# Patient Record
Sex: Female | Born: 1935 | Race: White | Hispanic: No | Smoking: Current every day smoker
Health system: Southern US, Community
[De-identification: ages and names within clinical notes are randomized; demographics above are authoritative.]

## PROBLEM LIST (undated history)

## (undated) DIAGNOSIS — M8580 Other specified disorders of bone density and structure, unspecified site: Secondary | ICD-10-CM

## (undated) DIAGNOSIS — M1990 Unspecified osteoarthritis, unspecified site: Secondary | ICD-10-CM

## (undated) DIAGNOSIS — K219 Gastro-esophageal reflux disease without esophagitis: Secondary | ICD-10-CM

## (undated) DIAGNOSIS — F329 Major depressive disorder, single episode, unspecified: Secondary | ICD-10-CM

## (undated) DIAGNOSIS — K649 Unspecified hemorrhoids: Secondary | ICD-10-CM

## (undated) DIAGNOSIS — G629 Polyneuropathy, unspecified: Secondary | ICD-10-CM

## (undated) DIAGNOSIS — Z8489 Family history of other specified conditions: Secondary | ICD-10-CM

## (undated) DIAGNOSIS — T4145XA Adverse effect of unspecified anesthetic, initial encounter: Secondary | ICD-10-CM

## (undated) DIAGNOSIS — J449 Chronic obstructive pulmonary disease, unspecified: Secondary | ICD-10-CM

## (undated) DIAGNOSIS — T8859XA Other complications of anesthesia, initial encounter: Secondary | ICD-10-CM

## (undated) DIAGNOSIS — E039 Hypothyroidism, unspecified: Secondary | ICD-10-CM

## (undated) DIAGNOSIS — Z87442 Personal history of urinary calculi: Secondary | ICD-10-CM

## (undated) DIAGNOSIS — C801 Malignant (primary) neoplasm, unspecified: Secondary | ICD-10-CM

## (undated) DIAGNOSIS — F32A Depression, unspecified: Secondary | ICD-10-CM

## (undated) DIAGNOSIS — F419 Anxiety disorder, unspecified: Secondary | ICD-10-CM

## (undated) DIAGNOSIS — Z923 Personal history of irradiation: Secondary | ICD-10-CM

## (undated) DIAGNOSIS — I10 Essential (primary) hypertension: Secondary | ICD-10-CM

## (undated) DIAGNOSIS — C4490 Unspecified malignant neoplasm of skin, unspecified: Secondary | ICD-10-CM

## (undated) DIAGNOSIS — E785 Hyperlipidemia, unspecified: Secondary | ICD-10-CM

## (undated) DIAGNOSIS — J40 Bronchitis, not specified as acute or chronic: Secondary | ICD-10-CM

## (undated) DIAGNOSIS — C50919 Malignant neoplasm of unspecified site of unspecified female breast: Secondary | ICD-10-CM

## (undated) HISTORY — PX: THYROID SURGERY: SHX805

## (undated) HISTORY — PX: BREAST EXCISIONAL BIOPSY: SUR124

## (undated) HISTORY — PX: ABDOMINAL HYSTERECTOMY: SHX81

## (undated) HISTORY — PX: OTHER SURGICAL HISTORY: SHX169

## (undated) HISTORY — PX: LUNG SURGERY: SHX703

## (undated) HISTORY — PX: BACK SURGERY: SHX140

## (undated) HISTORY — PX: BREAST SURGERY: SHX581

---

## 1997-07-23 DIAGNOSIS — C73 Malignant neoplasm of thyroid gland: Secondary | ICD-10-CM | POA: Insufficient documentation

## 1997-07-23 DIAGNOSIS — C801 Malignant (primary) neoplasm, unspecified: Secondary | ICD-10-CM

## 1997-07-23 HISTORY — DX: Malignant (primary) neoplasm, unspecified: C80.1

## 2004-04-22 ENCOUNTER — Ambulatory Visit: Payer: Self-pay | Admitting: Oncology

## 2004-08-02 ENCOUNTER — Ambulatory Visit: Payer: Self-pay | Admitting: Family Medicine

## 2004-08-16 ENCOUNTER — Ambulatory Visit: Payer: Self-pay | Admitting: Oncology

## 2004-08-23 ENCOUNTER — Ambulatory Visit: Payer: Self-pay | Admitting: Oncology

## 2004-09-20 ENCOUNTER — Ambulatory Visit: Payer: Self-pay | Admitting: Oncology

## 2004-12-14 ENCOUNTER — Ambulatory Visit: Payer: Self-pay | Admitting: Oncology

## 2004-12-21 ENCOUNTER — Ambulatory Visit: Payer: Self-pay | Admitting: Oncology

## 2005-03-07 ENCOUNTER — Ambulatory Visit: Payer: Self-pay | Admitting: Family Medicine

## 2005-03-15 ENCOUNTER — Ambulatory Visit: Payer: Self-pay | Admitting: Oncology

## 2005-03-20 ENCOUNTER — Ambulatory Visit: Payer: Self-pay | Admitting: Family Medicine

## 2005-03-23 ENCOUNTER — Ambulatory Visit: Payer: Self-pay | Admitting: Oncology

## 2005-04-30 ENCOUNTER — Ambulatory Visit: Payer: Self-pay | Admitting: Unknown Physician Specialty

## 2005-06-12 ENCOUNTER — Ambulatory Visit: Payer: Self-pay | Admitting: Oncology

## 2005-06-22 ENCOUNTER — Ambulatory Visit: Payer: Self-pay | Admitting: Oncology

## 2005-07-23 ENCOUNTER — Ambulatory Visit: Payer: Self-pay | Admitting: Oncology

## 2005-09-17 ENCOUNTER — Ambulatory Visit: Payer: Self-pay | Admitting: Family Medicine

## 2005-09-20 ENCOUNTER — Ambulatory Visit: Payer: Self-pay | Admitting: Oncology

## 2005-09-21 ENCOUNTER — Ambulatory Visit: Payer: Self-pay | Admitting: Family Medicine

## 2005-10-21 ENCOUNTER — Ambulatory Visit: Payer: Self-pay | Admitting: Oncology

## 2005-12-19 ENCOUNTER — Ambulatory Visit: Payer: Self-pay | Admitting: Oncology

## 2005-12-21 ENCOUNTER — Ambulatory Visit: Payer: Self-pay | Admitting: Oncology

## 2006-01-18 ENCOUNTER — Ambulatory Visit: Payer: Self-pay

## 2006-01-20 ENCOUNTER — Ambulatory Visit: Payer: Self-pay | Admitting: Oncology

## 2006-02-26 ENCOUNTER — Ambulatory Visit: Payer: Self-pay | Admitting: Oncology

## 2006-03-11 ENCOUNTER — Ambulatory Visit: Payer: Self-pay | Admitting: Family Medicine

## 2006-03-18 ENCOUNTER — Ambulatory Visit: Payer: Self-pay | Admitting: Unknown Physician Specialty

## 2006-03-23 ENCOUNTER — Ambulatory Visit: Payer: Self-pay | Admitting: Oncology

## 2006-04-22 ENCOUNTER — Ambulatory Visit: Payer: Self-pay | Admitting: Unknown Physician Specialty

## 2006-04-23 ENCOUNTER — Ambulatory Visit: Payer: Self-pay | Admitting: Unknown Physician Specialty

## 2006-04-30 ENCOUNTER — Ambulatory Visit: Payer: Self-pay | Admitting: Oncology

## 2006-05-28 ENCOUNTER — Ambulatory Visit: Payer: Self-pay | Admitting: Oncology

## 2006-06-22 ENCOUNTER — Ambulatory Visit: Payer: Self-pay | Admitting: Oncology

## 2006-09-21 ENCOUNTER — Ambulatory Visit: Payer: Self-pay | Admitting: Oncology

## 2006-10-07 ENCOUNTER — Ambulatory Visit: Payer: Self-pay | Admitting: Oncology

## 2006-10-22 ENCOUNTER — Ambulatory Visit: Payer: Self-pay | Admitting: Oncology

## 2006-12-22 ENCOUNTER — Ambulatory Visit: Payer: Self-pay | Admitting: Oncology

## 2007-01-06 ENCOUNTER — Ambulatory Visit: Payer: Self-pay | Admitting: Oncology

## 2007-01-21 ENCOUNTER — Ambulatory Visit: Payer: Self-pay | Admitting: Oncology

## 2007-03-24 ENCOUNTER — Ambulatory Visit: Payer: Self-pay | Admitting: Oncology

## 2007-04-07 ENCOUNTER — Ambulatory Visit: Payer: Self-pay | Admitting: Oncology

## 2007-04-23 ENCOUNTER — Ambulatory Visit: Payer: Self-pay | Admitting: Oncology

## 2007-07-03 ENCOUNTER — Ambulatory Visit: Payer: Self-pay | Admitting: Family Medicine

## 2007-07-24 ENCOUNTER — Ambulatory Visit: Payer: Self-pay | Admitting: Oncology

## 2007-08-24 ENCOUNTER — Ambulatory Visit: Payer: Self-pay | Admitting: Oncology

## 2007-09-21 ENCOUNTER — Ambulatory Visit: Payer: Self-pay | Admitting: Oncology

## 2007-10-15 ENCOUNTER — Ambulatory Visit: Payer: Self-pay | Admitting: Oncology

## 2007-10-22 ENCOUNTER — Ambulatory Visit: Payer: Self-pay | Admitting: Oncology

## 2007-11-21 ENCOUNTER — Ambulatory Visit: Payer: Self-pay | Admitting: Oncology

## 2007-12-22 ENCOUNTER — Ambulatory Visit: Payer: Self-pay | Admitting: Oncology

## 2008-02-17 ENCOUNTER — Ambulatory Visit: Payer: Self-pay | Admitting: Internal Medicine

## 2008-02-21 ENCOUNTER — Ambulatory Visit: Payer: Self-pay | Admitting: Oncology

## 2008-03-23 ENCOUNTER — Ambulatory Visit: Payer: Self-pay | Admitting: Oncology

## 2008-04-14 ENCOUNTER — Ambulatory Visit: Payer: Self-pay | Admitting: Oncology

## 2008-04-22 ENCOUNTER — Ambulatory Visit: Payer: Self-pay | Admitting: Oncology

## 2008-08-30 ENCOUNTER — Ambulatory Visit: Payer: Self-pay | Admitting: General Surgery

## 2008-10-21 ENCOUNTER — Ambulatory Visit: Payer: Self-pay | Admitting: Oncology

## 2008-10-27 ENCOUNTER — Ambulatory Visit: Payer: Self-pay | Admitting: Oncology

## 2008-11-19 ENCOUNTER — Ambulatory Visit: Payer: Self-pay

## 2008-11-20 ENCOUNTER — Ambulatory Visit: Payer: Self-pay | Admitting: Oncology

## 2009-04-22 ENCOUNTER — Ambulatory Visit: Payer: Self-pay | Admitting: Oncology

## 2009-04-25 ENCOUNTER — Ambulatory Visit: Payer: Self-pay | Admitting: General Surgery

## 2009-04-28 ENCOUNTER — Ambulatory Visit: Payer: Self-pay | Admitting: Oncology

## 2009-05-23 ENCOUNTER — Ambulatory Visit: Payer: Self-pay | Admitting: Oncology

## 2009-10-21 ENCOUNTER — Ambulatory Visit: Payer: Self-pay | Admitting: Oncology

## 2009-10-27 ENCOUNTER — Ambulatory Visit: Payer: Self-pay | Admitting: Oncology

## 2009-11-20 ENCOUNTER — Ambulatory Visit: Payer: Self-pay | Admitting: Oncology

## 2010-04-22 ENCOUNTER — Ambulatory Visit: Payer: Self-pay | Admitting: Oncology

## 2010-04-28 ENCOUNTER — Ambulatory Visit: Payer: Self-pay | Admitting: Oncology

## 2010-05-23 ENCOUNTER — Ambulatory Visit: Payer: Self-pay | Admitting: Oncology

## 2010-06-06 ENCOUNTER — Ambulatory Visit: Payer: Self-pay | Admitting: Family Medicine

## 2010-11-13 ENCOUNTER — Ambulatory Visit: Payer: Self-pay | Admitting: Oncology

## 2010-11-14 ENCOUNTER — Ambulatory Visit: Payer: Self-pay

## 2010-11-21 ENCOUNTER — Ambulatory Visit: Payer: Self-pay | Admitting: Oncology

## 2011-06-01 ENCOUNTER — Ambulatory Visit: Payer: Self-pay | Admitting: Oncology

## 2011-06-23 ENCOUNTER — Ambulatory Visit: Payer: Self-pay | Admitting: Oncology

## 2011-07-24 DIAGNOSIS — Z923 Personal history of irradiation: Secondary | ICD-10-CM

## 2011-07-24 HISTORY — DX: Personal history of irradiation: Z92.3

## 2011-07-27 ENCOUNTER — Ambulatory Visit: Payer: Self-pay | Admitting: Oncology

## 2011-08-24 ENCOUNTER — Ambulatory Visit: Payer: Self-pay | Admitting: Oncology

## 2011-09-21 ENCOUNTER — Ambulatory Visit: Payer: Self-pay | Admitting: Oncology

## 2011-10-03 ENCOUNTER — Ambulatory Visit: Payer: Self-pay | Admitting: Family Medicine

## 2011-10-04 ENCOUNTER — Ambulatory Visit: Payer: Self-pay | Admitting: Family Medicine

## 2011-10-08 ENCOUNTER — Ambulatory Visit: Payer: Self-pay | Admitting: Unknown Physician Specialty

## 2011-10-09 LAB — PATHOLOGY REPORT

## 2011-10-22 DIAGNOSIS — C50919 Malignant neoplasm of unspecified site of unspecified female breast: Secondary | ICD-10-CM | POA: Insufficient documentation

## 2011-10-22 HISTORY — PX: BREAST BIOPSY: SHX20

## 2011-10-30 ENCOUNTER — Ambulatory Visit: Payer: Self-pay | Admitting: Surgery

## 2011-11-14 ENCOUNTER — Ambulatory Visit: Payer: Self-pay | Admitting: Surgery

## 2011-11-14 DIAGNOSIS — I10 Essential (primary) hypertension: Secondary | ICD-10-CM

## 2011-11-14 LAB — COMPREHENSIVE METABOLIC PANEL
BUN: 17 mg/dL (ref 7–18)
Bilirubin,Total: 0.6 mg/dL (ref 0.2–1.0)
Calcium, Total: 9.1 mg/dL (ref 8.5–10.1)
Co2: 32 mmol/L (ref 21–32)
EGFR (African American): >60
EGFR (Non-African Amer.): >60
Osmolality: 284 (ref 275–301)
SGPT (ALT): 21 U/L

## 2011-11-14 LAB — CBC WITH DIFFERENTIAL/PLATELET
Basophil #: 0 10*3/uL (ref 0.0–0.1)
Basophil %: 0.5 %
Eosinophil %: 1.3 %
HGB: 12.9 g/dL (ref 12.0–16.0)
Lymphocyte #: 1.5 10*3/uL (ref 1.0–3.6)
Lymphocyte %: 26.3 %
MCV: 84 fL (ref 80–100)
Monocyte #: 0.5 x10 3/mm (ref 0.2–0.9)
WBC: 5.6 10*3/uL (ref 3.6–11.0)

## 2011-11-21 ENCOUNTER — Ambulatory Visit: Payer: Self-pay | Admitting: Surgery

## 2011-11-21 DIAGNOSIS — C50919 Malignant neoplasm of unspecified site of unspecified female breast: Secondary | ICD-10-CM

## 2011-11-21 HISTORY — PX: BREAST LUMPECTOMY: SHX2

## 2011-11-21 HISTORY — DX: Malignant neoplasm of unspecified site of unspecified female breast: C50.919

## 2011-11-26 LAB — PATHOLOGY REPORT

## 2011-11-30 ENCOUNTER — Ambulatory Visit: Payer: Self-pay | Admitting: Oncology

## 2011-11-30 LAB — COMPREHENSIVE METABOLIC PANEL
Alkaline Phosphatase: 122 U/L (ref 50–136)
Anion Gap: 7 (ref 7–16)
BUN: 13 mg/dL (ref 7–18)
Bilirubin,Total: 0.6 mg/dL (ref 0.2–1.0)
Calcium, Total: 9.5 mg/dL (ref 8.5–10.1)
Chloride: 101 mmol/L (ref 98–107)
Co2: 32 mmol/L (ref 21–32)
Creatinine: 0.84 mg/dL (ref 0.60–1.30)
EGFR (African American): >60
EGFR (Non-African Amer.): >60
Osmolality: 280 (ref 275–301)
SGOT(AST): 19 U/L (ref 15–37)

## 2011-11-30 LAB — CBC CANCER CENTER
Basophil #: 0 x10 3/mm (ref 0.0–0.1)
Basophil %: 0.9 %
Eosinophil #: 0.4 x10 3/mm (ref 0.0–0.7)
Eosinophil %: 7.1 %
Lymphocyte #: 1.2 x10 3/mm (ref 1.0–3.6)
MCH: 27.9 pg (ref 26.0–34.0)
MCHC: 33.1 g/dL (ref 32.0–36.0)
MCV: 84 fL (ref 80–100)
Monocyte #: 0.5 x10 3/mm (ref 0.2–0.9)
Monocyte %: 9.6 %
Neutrophil %: 61.3 %
Platelet: 241 x10 3/mm (ref 150–440)
WBC: 5.5 x10 3/mm (ref 3.6–11.0)

## 2011-12-22 ENCOUNTER — Ambulatory Visit: Payer: Self-pay | Admitting: Oncology

## 2012-01-10 LAB — CBC CANCER CENTER
Basophil #: 0 x10 3/mm (ref 0.0–0.1)
Eosinophil #: 0.2 x10 3/mm (ref 0.0–0.7)
Eosinophil %: 4.4 %
HCT: 43.1 % (ref 35.0–47.0)
MCH: 27.7 pg (ref 26.0–34.0)
Monocyte #: 0.5 x10 3/mm (ref 0.2–0.9)
Monocyte %: 9.9 %
Neutrophil #: 2.9 x10 3/mm (ref 1.4–6.5)
Neutrophil %: 62.3 %
Platelet: 222 x10 3/mm (ref 150–440)
WBC: 4.6 x10 3/mm (ref 3.6–11.0)

## 2012-01-14 LAB — CBC CANCER CENTER
Basophil #: 0 x10 3/mm (ref 0.0–0.1)
Basophil %: 0.6 %
Eosinophil #: 0.2 x10 3/mm (ref 0.0–0.7)
Eosinophil %: 5.4 %
HCT: 43.2 % (ref 35.0–47.0)
HGB: 14 g/dL (ref 12.0–16.0)
Lymphocyte %: 28.7 %
MCH: 27.4 pg (ref 26.0–34.0)
MCHC: 32.5 g/dL (ref 32.0–36.0)
MCV: 84 fL (ref 80–100)
Monocyte %: 14.8 %
Neutrophil #: 1.7 x10 3/mm (ref 1.4–6.5)
Neutrophil %: 50.5 %
Platelet: 196 x10 3/mm (ref 150–440)

## 2012-01-14 LAB — COMPREHENSIVE METABOLIC PANEL
Albumin: 3.4 g/dL (ref 3.4–5.0)
Alkaline Phosphatase: 95 U/L (ref 50–136)
Anion Gap: 6 — ABNORMAL LOW (ref 7–16)
BUN: 10 mg/dL (ref 7–18)
Bilirubin,Total: 0.3 mg/dL (ref 0.2–1.0)
Calcium, Total: 9.1 mg/dL (ref 8.5–10.1)
Chloride: 104 mmol/L (ref 98–107)
Co2: 32 mmol/L (ref 21–32)
Creatinine: 0.76 mg/dL (ref 0.60–1.30)
EGFR (African American): >60
EGFR (Non-African Amer.): >60
Glucose: 119 mg/dL — ABNORMAL HIGH (ref 65–99)
Osmolality: 283 (ref 275–301)
Potassium: 3.6 mmol/L (ref 3.5–5.1)
SGOT(AST): 23 U/L (ref 15–37)
SGPT (ALT): 23 U/L
Sodium: 142 mmol/L (ref 136–145)
Total Protein: 6.7 g/dL (ref 6.4–8.2)

## 2012-01-21 ENCOUNTER — Ambulatory Visit: Payer: Self-pay | Admitting: Oncology

## 2012-01-23 LAB — CBC CANCER CENTER
Basophil #: 0 x10 3/mm (ref 0.0–0.1)
Lymphocyte #: 0.7 x10 3/mm — ABNORMAL LOW (ref 1.0–3.6)
Lymphocyte %: 13.6 %
MCHC: 32.5 g/dL (ref 32.0–36.0)
MCV: 84 fL (ref 80–100)
Monocyte %: 12.3 %
Neutrophil #: 3.9 x10 3/mm (ref 1.4–6.5)
Neutrophil %: 71.4 %
Platelet: 200 x10 3/mm (ref 150–440)
RBC: 4.93 10*6/uL (ref 3.80–5.20)
RDW: 14.1 % (ref 11.5–14.5)
WBC: 5.5 x10 3/mm (ref 3.6–11.0)

## 2012-01-31 LAB — CBC CANCER CENTER
Basophil %: 0.8 %
Eosinophil #: 0.1 x10 3/mm (ref 0.0–0.7)
Eosinophil %: 2.6 %
HCT: 40.8 % (ref 35.0–47.0)
HGB: 13.3 g/dL (ref 12.0–16.0)
Lymphocyte #: 0.9 x10 3/mm — ABNORMAL LOW (ref 1.0–3.6)
MCH: 27.7 pg (ref 26.0–34.0)
MCHC: 32.6 g/dL (ref 32.0–36.0)
MCV: 85 fL (ref 80–100)
Monocyte #: 0.5 x10 3/mm (ref 0.2–0.9)
Neutrophil #: 2.6 x10 3/mm (ref 1.4–6.5)
Neutrophil %: 63.9 %
RDW: 13.7 % (ref 11.5–14.5)

## 2012-02-07 LAB — CBC CANCER CENTER
Basophil %: 0.8 %
Eosinophil #: 0.1 x10 3/mm (ref 0.0–0.7)
Eosinophil %: 3.6 %
HCT: 42.4 % (ref 35.0–47.0)
HGB: 13.6 g/dL (ref 12.0–16.0)
Lymphocyte %: 20.4 %
MCH: 27.5 pg (ref 26.0–34.0)
MCHC: 32.2 g/dL (ref 32.0–36.0)
MCV: 85 fL (ref 80–100)
Monocyte #: 0.4 x10 3/mm (ref 0.2–0.9)
Neutrophil #: 2.3 x10 3/mm (ref 1.4–6.5)
Platelet: 166 x10 3/mm (ref 150–440)
WBC: 3.6 x10 3/mm (ref 3.6–11.0)

## 2012-02-14 LAB — CBC CANCER CENTER
Basophil #: 0.1 x10 3/mm (ref 0.0–0.1)
HCT: 42.3 % (ref 35.0–47.0)
Lymphocyte #: 0.9 x10 3/mm — ABNORMAL LOW (ref 1.0–3.6)
MCH: 28.4 pg (ref 26.0–34.0)
MCHC: 33.4 g/dL (ref 32.0–36.0)
MCV: 85 fL (ref 80–100)
Monocyte #: 0.4 x10 3/mm (ref 0.2–0.9)
Monocyte %: 8.5 %
Platelet: 173 x10 3/mm (ref 150–440)
RBC: 4.98 10*6/uL (ref 3.80–5.20)
RDW: 13.9 % (ref 11.5–14.5)
WBC: 5 x10 3/mm (ref 3.6–11.0)

## 2012-02-21 ENCOUNTER — Ambulatory Visit: Payer: Self-pay | Admitting: Oncology

## 2012-03-07 LAB — COMPREHENSIVE METABOLIC PANEL
Bilirubin,Total: 0.5 mg/dL (ref 0.2–1.0)
Calcium, Total: 9.4 mg/dL (ref 8.5–10.1)
Chloride: 104 mmol/L (ref 98–107)
Co2: 35 mmol/L — ABNORMAL HIGH (ref 21–32)
Creatinine: 0.79 mg/dL (ref 0.60–1.30)
EGFR (African American): >60
EGFR (Non-African Amer.): >60
Glucose: 103 mg/dL — ABNORMAL HIGH (ref 65–99)
SGOT(AST): 16 U/L (ref 15–37)
SGPT (ALT): 19 U/L (ref 12–78)
Total Protein: 6.8 g/dL (ref 6.4–8.2)

## 2012-03-07 LAB — CBC CANCER CENTER
Basophil #: 0.1 x10 3/mm (ref 0.0–0.1)
HCT: 42.1 % (ref 35.0–47.0)
HGB: 14.1 g/dL (ref 12.0–16.0)
Lymphocyte %: 12.1 %
MCV: 85 fL (ref 80–100)
Monocyte %: 9.2 %
Neutrophil %: 75.1 %
RBC: 4.95 10*6/uL (ref 3.80–5.20)
RDW: 14.3 % (ref 11.5–14.5)
WBC: 5.4 x10 3/mm (ref 3.6–11.0)

## 2012-03-07 LAB — TSH: Thyroid Stimulating Horm: 0.267 u[IU]/mL — ABNORMAL LOW

## 2012-03-23 ENCOUNTER — Ambulatory Visit: Payer: Self-pay | Admitting: Oncology

## 2012-04-22 ENCOUNTER — Ambulatory Visit: Payer: Self-pay | Admitting: Oncology

## 2012-06-13 ENCOUNTER — Ambulatory Visit: Payer: Self-pay | Admitting: Oncology

## 2012-06-13 LAB — CBC CANCER CENTER
Eosinophil #: 0.1 x10 3/mm (ref 0.0–0.7)
HCT: 40 % (ref 35.0–47.0)
HGB: 13.8 g/dL (ref 12.0–16.0)
MCH: 29.4 pg (ref 26.0–34.0)
MCHC: 34.5 g/dL (ref 32.0–36.0)
MCV: 85 fL (ref 80–100)
Monocyte #: 0.4 x10 3/mm (ref 0.2–0.9)
Monocyte %: 9.8 %
Neutrophil #: 2.6 x10 3/mm (ref 1.4–6.5)
Neutrophil %: 66.9 %
Platelet: 187 x10 3/mm (ref 150–440)
RDW: 13.6 % (ref 11.5–14.5)

## 2012-06-13 LAB — COMPREHENSIVE METABOLIC PANEL
Albumin: 3.5 g/dL (ref 3.4–5.0)
Anion Gap: 3 — ABNORMAL LOW (ref 7–16)
BUN: 14 mg/dL (ref 7–18)
Bilirubin,Total: 0.6 mg/dL (ref 0.2–1.0)
Calcium, Total: 9.4 mg/dL (ref 8.5–10.1)
Chloride: 103 mmol/L (ref 98–107)
EGFR (African American): >60
Glucose: 97 mg/dL (ref 65–99)
Potassium: 3.9 mmol/L (ref 3.5–5.1)
SGOT(AST): 18 U/L (ref 15–37)
SGPT (ALT): 18 U/L (ref 12–78)
Sodium: 141 mmol/L (ref 136–145)
Total Protein: 6.9 g/dL (ref 6.4–8.2)

## 2012-06-14 LAB — CANCER ANTIGEN 27.29: CA 27.29: 27.6 U/mL (ref 0.0–38.6)

## 2012-06-22 ENCOUNTER — Ambulatory Visit: Payer: Self-pay | Admitting: Oncology

## 2012-07-17 ENCOUNTER — Ambulatory Visit: Payer: Self-pay | Admitting: Family Medicine

## 2012-07-17 LAB — RAPID INFLUENZA A&B ANTIGENS

## 2012-07-25 ENCOUNTER — Ambulatory Visit: Payer: Self-pay | Admitting: Radiation Oncology

## 2012-10-06 ENCOUNTER — Ambulatory Visit: Payer: Self-pay | Admitting: Family Medicine

## 2012-10-10 ENCOUNTER — Ambulatory Visit: Payer: Self-pay | Admitting: Oncology

## 2012-10-10 LAB — CBC CANCER CENTER
Basophil #: 0 x10 3/mm (ref 0.0–0.1)
Eosinophil #: 0.3 x10 3/mm (ref 0.0–0.7)
Lymphocyte %: 17.8 %
MCH: 27.7 pg (ref 26.0–34.0)
MCHC: 33 g/dL (ref 32.0–36.0)
MCV: 84 fL (ref 80–100)
Monocyte %: 10.4 %
Neutrophil #: 2.7 x10 3/mm (ref 1.4–6.5)
Neutrophil %: 63.8 %
Platelet: 196 x10 3/mm (ref 150–440)
RBC: 4.94 10*6/uL (ref 3.80–5.20)
WBC: 4.3 x10 3/mm (ref 3.6–11.0)

## 2012-10-10 LAB — COMPREHENSIVE METABOLIC PANEL
Albumin: 3.5 g/dL (ref 3.4–5.0)
BUN: 12 mg/dL (ref 7–18)
Calcium, Total: 9.3 mg/dL (ref 8.5–10.1)
Chloride: 100 mmol/L (ref 98–107)
Creatinine: 0.81 mg/dL (ref 0.60–1.30)
Glucose: 120 mg/dL — ABNORMAL HIGH (ref 65–99)
Potassium: 3.7 mmol/L (ref 3.5–5.1)
SGOT(AST): 17 U/L (ref 15–37)
SGPT (ALT): 18 U/L (ref 12–78)
Sodium: 140 mmol/L (ref 136–145)
Total Protein: 6.7 g/dL (ref 6.4–8.2)

## 2012-10-21 ENCOUNTER — Ambulatory Visit: Payer: Self-pay | Admitting: Oncology

## 2013-05-08 ENCOUNTER — Ambulatory Visit: Payer: Self-pay | Admitting: Oncology

## 2013-05-08 LAB — CBC CANCER CENTER
Basophil #: 0 x10 3/mm (ref 0.0–0.1)
Eosinophil #: 0.1 x10 3/mm (ref 0.0–0.7)
HCT: 43.6 % (ref 35.0–47.0)
HGB: 14.4 g/dL (ref 12.0–16.0)
Lymphocyte #: 0.8 x10 3/mm — ABNORMAL LOW (ref 1.0–3.6)
MCH: 28.2 pg (ref 26.0–34.0)
MCHC: 33 g/dL (ref 32.0–36.0)
Monocyte #: 0.4 x10 3/mm (ref 0.2–0.9)
Monocyte %: 9.5 %
Neutrophil %: 68.2 %
Platelet: 186 x10 3/mm (ref 150–440)
WBC: 4.3 x10 3/mm (ref 3.6–11.0)

## 2013-05-08 LAB — TSH: Thyroid Stimulating Horm: 0.19 u[IU]/mL — ABNORMAL LOW

## 2013-05-08 LAB — COMPREHENSIVE METABOLIC PANEL
Albumin: 3.6 g/dL (ref 3.4–5.0)
Alkaline Phosphatase: 110 U/L (ref 50–136)
Anion Gap: 5 — ABNORMAL LOW (ref 7–16)
BUN: 12 mg/dL (ref 7–18)
Bilirubin,Total: 0.7 mg/dL (ref 0.2–1.0)
Calcium, Total: 9 mg/dL (ref 8.5–10.1)
Co2: 34 mmol/L — ABNORMAL HIGH (ref 21–32)
Creatinine: 0.78 mg/dL (ref 0.60–1.30)
EGFR (Non-African Amer.): >60
SGOT(AST): 16 U/L (ref 15–37)
SGPT (ALT): 21 U/L (ref 12–78)
Total Protein: 6.8 g/dL (ref 6.4–8.2)

## 2013-05-23 ENCOUNTER — Ambulatory Visit: Payer: Self-pay | Admitting: Oncology

## 2013-09-14 ENCOUNTER — Inpatient Hospital Stay: Payer: Self-pay | Admitting: Internal Medicine

## 2013-09-14 LAB — BASIC METABOLIC PANEL
ANION GAP: 4 — AB (ref 7–16)
BUN: 9 mg/dL (ref 7–18)
CALCIUM: 8.5 mg/dL (ref 8.5–10.1)
Chloride: 102 mmol/L (ref 98–107)
Co2: 31 mmol/L (ref 21–32)
Creatinine: 0.94 mg/dL (ref 0.60–1.30)
EGFR (African American): >60
EGFR (Non-African Amer.): 58 — ABNORMAL LOW
GLUCOSE: 148 mg/dL — AB (ref 65–99)
OSMOLALITY: 275 (ref 275–301)
Potassium: 3.4 mmol/L — ABNORMAL LOW (ref 3.5–5.1)
Sodium: 137 mmol/L (ref 136–145)

## 2013-09-14 LAB — CBC
HCT: 41.7 % (ref 35.0–47.0)
HGB: 13.5 g/dL (ref 12.0–16.0)
MCH: 27.9 pg (ref 26.0–34.0)
MCHC: 32.3 g/dL (ref 32.0–36.0)
MCV: 86 fL (ref 80–100)
Platelet: 146 10*3/uL — ABNORMAL LOW (ref 150–440)
RBC: 4.83 10*6/uL (ref 3.80–5.20)
RDW: 13.3 % (ref 11.5–14.5)
WBC: 3 10*3/uL — ABNORMAL LOW (ref 3.6–11.0)

## 2013-09-14 LAB — TROPONIN I

## 2013-09-14 LAB — RAPID INFLUENZA A&B ANTIGENS

## 2013-09-17 LAB — EXPECTORATED SPUTUM ASSESSMENT W REFEX TO RESP CULTURE

## 2013-09-19 LAB — CULTURE, BLOOD (SINGLE)

## 2013-10-07 ENCOUNTER — Ambulatory Visit: Payer: Self-pay | Admitting: Surgery

## 2013-10-28 DIAGNOSIS — M8580 Other specified disorders of bone density and structure, unspecified site: Secondary | ICD-10-CM | POA: Insufficient documentation

## 2013-10-28 DIAGNOSIS — E039 Hypothyroidism, unspecified: Secondary | ICD-10-CM | POA: Insufficient documentation

## 2013-10-28 DIAGNOSIS — M1990 Unspecified osteoarthritis, unspecified site: Secondary | ICD-10-CM | POA: Insufficient documentation

## 2013-10-28 DIAGNOSIS — Z85850 Personal history of malignant neoplasm of thyroid: Secondary | ICD-10-CM | POA: Insufficient documentation

## 2013-10-28 DIAGNOSIS — Z87442 Personal history of urinary calculi: Secondary | ICD-10-CM | POA: Insufficient documentation

## 2013-10-28 DIAGNOSIS — K219 Gastro-esophageal reflux disease without esophagitis: Secondary | ICD-10-CM | POA: Insufficient documentation

## 2013-10-28 DIAGNOSIS — E782 Mixed hyperlipidemia: Secondary | ICD-10-CM | POA: Insufficient documentation

## 2013-10-28 DIAGNOSIS — L858 Other specified epidermal thickening: Secondary | ICD-10-CM | POA: Insufficient documentation

## 2013-10-28 DIAGNOSIS — Z8719 Personal history of other diseases of the digestive system: Secondary | ICD-10-CM | POA: Insufficient documentation

## 2013-10-28 DIAGNOSIS — F329 Major depressive disorder, single episode, unspecified: Secondary | ICD-10-CM | POA: Insufficient documentation

## 2013-10-28 DIAGNOSIS — I10 Essential (primary) hypertension: Secondary | ICD-10-CM | POA: Insufficient documentation

## 2013-11-13 ENCOUNTER — Ambulatory Visit: Payer: Self-pay | Admitting: Oncology

## 2013-11-13 LAB — COMPREHENSIVE METABOLIC PANEL
ALK PHOS: 104 U/L
Albumin: 3.6 g/dL (ref 3.4–5.0)
Anion Gap: 5 — ABNORMAL LOW (ref 7–16)
BUN: 12 mg/dL (ref 7–18)
Bilirubin,Total: 0.5 mg/dL (ref 0.2–1.0)
Calcium, Total: 9.3 mg/dL (ref 8.5–10.1)
Chloride: 101 mmol/L (ref 98–107)
Co2: 35 mmol/L — ABNORMAL HIGH (ref 21–32)
Creatinine: 0.86 mg/dL (ref 0.60–1.30)
EGFR (African American): >60
GLUCOSE: 122 mg/dL — AB (ref 65–99)
Osmolality: 282 (ref 275–301)
POTASSIUM: 3.4 mmol/L — AB (ref 3.5–5.1)
SGOT(AST): 17 U/L (ref 15–37)
SGPT (ALT): 21 U/L (ref 12–78)
Sodium: 141 mmol/L (ref 136–145)
Total Protein: 6.8 g/dL (ref 6.4–8.2)

## 2013-11-13 LAB — CBC CANCER CENTER
BASOS ABS: 0 x10 3/mm (ref 0.0–0.1)
BASOS PCT: 0.9 %
EOS ABS: 0.1 x10 3/mm (ref 0.0–0.7)
Eosinophil %: 2.7 %
HCT: 40 % (ref 35.0–47.0)
HGB: 13.2 g/dL (ref 12.0–16.0)
LYMPHS ABS: 1.1 x10 3/mm (ref 1.0–3.6)
Lymphocyte %: 26.1 %
MCH: 28.4 pg (ref 26.0–34.0)
MCHC: 33 g/dL (ref 32.0–36.0)
MCV: 86 fL (ref 80–100)
Monocyte #: 0.4 x10 3/mm (ref 0.2–0.9)
Monocyte %: 9.5 %
NEUTROS PCT: 60.8 %
Neutrophil #: 2.6 x10 3/mm (ref 1.4–6.5)
PLATELETS: 206 x10 3/mm (ref 150–440)
RBC: 4.65 10*6/uL (ref 3.80–5.20)
RDW: 13.6 % (ref 11.5–14.5)
WBC: 4.3 x10 3/mm (ref 3.6–11.0)

## 2013-11-13 LAB — TSH: Thyroid Stimulating Horm: 0.48 u[IU]/mL

## 2013-11-20 ENCOUNTER — Ambulatory Visit: Payer: Self-pay | Admitting: Oncology

## 2014-04-10 ENCOUNTER — Ambulatory Visit: Payer: Self-pay | Admitting: Emergency Medicine

## 2014-10-12 ENCOUNTER — Ambulatory Visit: Payer: Self-pay | Admitting: Oncology

## 2014-11-03 ENCOUNTER — Ambulatory Visit
Admit: 2014-11-03 | Disposition: A | Discharge status: Home / Self Care | Payer: Self-pay | Attending: Hematology and Oncology | Admitting: Hematology and Oncology

## 2014-11-03 LAB — CBC CANCER CENTER
Basophil #: 0 x10 3/mm (ref 0.0–0.1)
Basophil %: 0.9 %
Eosinophil #: 0.2 x10 3/mm (ref 0.0–0.7)
Eosinophil %: 3.7 %
HCT: 38.4 % (ref 35.0–47.0)
HGB: 12.6 g/dL (ref 12.0–16.0)
Lymphocyte #: 1 x10 3/mm (ref 1.0–3.6)
Lymphocyte %: 23.2 %
MCH: 27.5 pg (ref 26.0–34.0)
MCHC: 32.8 g/dL (ref 32.0–36.0)
MCV: 84 fL (ref 80–100)
Monocyte #: 0.4 x10 3/mm (ref 0.2–0.9)
Monocyte %: 8.8 %
Neutrophil #: 2.7 x10 3/mm (ref 1.4–6.5)
Neutrophil %: 63.4 %
Platelet: 206 x10 3/mm (ref 150–440)
RBC: 4.58 10*6/uL (ref 3.80–5.20)
RDW: 13.5 % (ref 11.5–14.5)
WBC: 4.2 x10 3/mm (ref 3.6–11.0)

## 2014-11-03 LAB — COMPREHENSIVE METABOLIC PANEL
Albumin: 3.9 g/dL
Alkaline Phosphatase: 92 U/L
Anion Gap: 7 (ref 7–16)
BUN: 12 mg/dL
Bilirubin,Total: 0.5 mg/dL
Calcium, Total: 9.2 mg/dL
Chloride: 98 mmol/L — ABNORMAL LOW
Co2: 34 mmol/L — ABNORMAL HIGH
Creatinine: 0.74 mg/dL
EGFR (African American): >60
EGFR (Non-African Amer.): >60
Glucose: 121 mg/dL — ABNORMAL HIGH
Potassium: 3.7 mmol/L
SGOT(AST): 21 U/L
SGPT (ALT): 15 U/L
Sodium: 139 mmol/L
Total Protein: 6.4 g/dL — ABNORMAL LOW

## 2014-11-03 LAB — TSH: Thyroid Stimulating Horm: 0.739 u[IU]/mL

## 2014-11-05 ENCOUNTER — Other Ambulatory Visit: Payer: Self-pay | Admitting: Hematology and Oncology

## 2014-11-05 DIAGNOSIS — Z1231 Encounter for screening mammogram for malignant neoplasm of breast: Secondary | ICD-10-CM

## 2014-11-11 ENCOUNTER — Ambulatory Visit
Admit: 2014-11-11 | Disposition: A | Discharge status: Home / Self Care | Payer: Self-pay | Attending: Urology | Admitting: Urology

## 2014-11-13 NOTE — H&P (Signed)
PATIENT NAME:  Rose Irwin, Rose Irwin MR#:  [id number] DATE OF BIRTH:  09-21-1931  DATE OF ADMISSION:  09/14/2013  PRIMARY CARE PHYSICIAN:  Dr. Juluis Pitch.  CHIEF COMPLAINT: Shortness of breath and chest tightness for 2 to 3 days.   HISTORY OF PRESENT ILLNESS: Rose Irwin is a 81 year old Caucasian female with history of COPD and ongoing tobacco abuse for many years, history of hypothyroidism, depression, breast cancer, comes to the Emergency Room after she started having increasing shortness of breath for 3 days. The patient's husband was sick  last week with URI symptoms. She started taking his leftover antibiotics, however, continued to feel poorly with increasing shortness of breath. She was found to be very tachycardic with heart rate in the 110s and was hypoxic with sats in the 80s on arrival to the Emergency Room. Chest x-ray does not show any evidence of pneumonia. She is being admitted with acute hypoxic respiratory failure secondary to COPD exacerbation with possible bronchitis.   PAST MEDICAL HISTORY:   1.  History of breast cancer.  2.  COPD disease with ongoing tobacco abuse.  3.  Depression.  4.  Hypothyroidism.  5.  Anxiety.  6.  History of hypertension.   PAST SURGICAL HISTORY:  1.  Thyroidectomy.  2.  Partial mastectomy.  3.  Hysterectomy.  4.  Lumbar laminectomy.   ALLERGIES: ASPIRIN, CODEINE AND SULFA.   MEDICATIONS: 1.  Zocor 20 mg p.o. daily.  2.  Xanax 0.5 mg b.i.d.  3.  Synthroid 100 mcg p.o. daily.  4.  Paxil 40 mg p.o. daily.  5.  Metoprolol 50 mg b.i.d.  6.  Lasix 40 mg daily.  7.  Combivent 2 puffs 3 times a day.  8.  Aspirin 81 mg daily.  9.  Anastrozole 1 mg p.o. daily.   FAMILY HISTORY:  Positive for breast cancer in both mother and sister, along with history of colon cancer and leukemia. History of heart disease and hypertension.   SOCIAL HISTORY: Does smoke about 5 to 6 cigarettes a day. Lives with her husband.   REVIEW OF SYSTEMS:  CONSTITUTIONAL:  No fever. Positive for fatigue and weakness.  EYES: No blurred or double vision or glaucoma.  EARS, NOSE, THROAT:  No tinnitus, ear pain, hearing loss.  RESPIRATORY:  Positive for shortness of breath, cough. No hemoptysis.    CARDIOVASCULAR: Positive for chest tightness. No arrhythmia. Positive for hypertension.  GASTROINTESTINAL: No nausea, vomiting, diarrhea, abdominal pain, melena or GERD. GENITOURINARY:   No dysuria or hematuria.  ENDOCRINE: No polyuria, nocturia or thyroid problems.  HEMATOLOGY: No anemia, easy bruising or bleeding disorder.  MUSCULOSKELETAL: Positive for back pain, chronic. No gout, swelling of joints.  NEUROLOGIC: No CVA, TIA, tremors, seizures or syncope.  PSYCHIATRIC: The patient appears somewhat anxious. No bipolar or schizophrenia. All other systems reviewed and negative.   PHYSICAL EXAMINATION: GENERAL: The patient is awake, alert, oriented x 3.  VITAL SIGNS: Temperature is 98.7, pulse is 110, regular. His respirations 18, blood pressure 167/65. Sats are 95% on 2 liters.  HEENT: Atraumatic, normocephalic. Pupils: PERRLA. EOM intact. Oral mucosa is moist.  NECK: Supple. No JVD. No carotid bruit.  RESPIRATORY: The patient does have distant breath sounds. She is moving air, bilateral good air entry. No crackles, rales or rhonchi heard at this time. No use of accessory muscles or labored breathing.  CARDIOVASCULAR: Both the heart sounds are normal. Rate is tachycardic. Rhythm is regular. No murmur heard. PMI not lateralized. Chest not tender.  EXTREMITIES:  Good pedal pulses, good femoral pulses. No lower extremity edema.  ABDOMEN: Soft, benign, nontender. No organomegaly. Positive bowel sounds.  NEUROLOGIC: Grossly intact cranial nerves II through XII. No motor or sensory deficit.  PSYCHIATRIC: The patient is awake, alert, oriented x 3.   CBC within normal limits. Basic metabolic panel within normal limits except potassium of 3.4 and glucose of 148. Cardiac enzymes  first set is negative. Chest x-ray is stable cardiomegaly with COPD, mild interstitial prominence and could reflect bronchitis.   ASSESSMENT AND PLAN: A 81 year old Rose Irwin with history of chronic obstructive pulmonary disease, ongoing tobacco abuse, hypothyroidism and hypertension, comes in with increasing shortness of breath and cough. She had a sick contact with her husband, who was down with upper respiratory infection symptoms last week. She is being admitted with:  1. Acute hypoxic respiratory failure secondary to chronic obstructive pulmonary disease exacerbation along with acute bronchitis. We will admit the patient to the medical floor, start her on IV Solu-Medrol around-the-clock, empiric antibiotic with Zithromax, continue nebulizer treatments around-the-clock, continue her Combivent, add Advair 1 puff b.i.d. Follow blood cultures and sputum cultures. Continue oxygen for now and wean as tolerated.  2.  Ongoing tobacco abuse. Smoking cessation was discussed with the patient; about 3 minutes spent on smoking cessation counseling. The patient tells me she is going to be working towards it.  3.  Hypertension, continue on metoprolol.  4.  History of breast cancer. Continue anastrozole.  5.  History of hypothyroidism, on Synthroid.  6.  Anxiety, depression. Continue Xanax and Paxil.  7.  Deep vein thrombosis prophylaxis, subQ heparin t.i.d.   Further workup per the patient's clinical course. Hospital admission plan was discussed with the patient. No family members were present. The patient is a FULL CODE.   TIME SPENT: 50 minutes.    ____________________________ Hart Rochester Posey Pronto, MD sap:dmm D: 09/14/2013 09:11:19 ET T: 09/14/2013 09:37:55 ET JOB#: [id number]  cc: Copeland Neisen A. Posey Pronto, MD, <Dictator> Youlanda Roys. Lovie Macadamia, MD Ilda Basset MD ELECTRONICALLY SIGNED 09/20/2013 13:41

## 2014-11-13 NOTE — Discharge Summary (Signed)
PATIENT NAME:  Rose Irwin, BLIZZARD MR#:  [id number] DATE OF BIRTH:  1931-09-06  DATE OF ADMISSION:  09/14/2013 DATE OF DISCHARGE:  09/16/2013  PRESENTING COMPLAINT: Shortness of breath and cough.   DISCHARGE DIAGNOSES:  1.  Acute-on-chronic hypoxic respiratory failure secondary to chronic obstructive pulmonary disease flare.  2.  Ex-smoker.  3.  Saturations 87% on room air, 93% to 95% on 3 liters.   CODE STATUS: FULL CODE.   MEDICATIONS:  1.  Anastrozole 1 mg p.o. daily.  2.  Xanax 0.5 mg b.i.d. as needed.  3.  Synthroid 100 mcg p.o. daily.  4.  Paxil 40 mg p.o. daily.  5.  Aspirin 81 mg daily.  6.  Lasix 40 mg daily as needed.  7.  Combivent 2 puffs three times a day as needed.  8.  Metoprolol 50 mg b.i.d.  9.  Zocor 20 mg p.o. daily at bedtime.  10.  Prednisone taper.  11.  Azithromycin 250 mg p.o. daily.  12.  Advair 250/50 one puff b.i.d.   BRIEF SUMMARY OF HOSPITAL COURSE: [name] is a very pleasant 81 year old Caucasian female with long-standing history of smoking and history of COPD along with hypertension who comes in with increasing shortness of breath and cough. She had a sick contact with her husband who is down with upper respiratory infection. She is being admitted with:  1.  Acute hypoxic respiratory failure secondary to COPD exacerbation along with acute bronchitis. She was admitted on medical floor, started on IV Solu-Medrol, empiric antibiotics with Zithromax, continued nebulizer treatments around the clock along with Combivent and Advair. Her prednisone was changed to p.o. taper. Sats dropped down to 87% on room air, improved with 3 liters nasal cannula oxygen to 94% and she has been set up with home oxygen.  2.  Ongoing tobacco abuse. The patient was advised on smoking cessation.  3.  Hypertension, on metoprolol.  4.  History of breast cancer. Continue anastrozole.  5.  Hypothyroidism, Synthroid.  6.  Anxiety, depression, on Xanax and Paxil.  7.  DVT  prophylaxis. Subcu heparin was provided.   Hospital stay otherwise remained stable.   CODE STATUS: THE PATIENT REMAINED A FULL CODE.   The patient is being sent home with oxygen and she will follow up with Dr. Lovie Macadamia in 1 to 2 weeks.   TIME SPENT: 40 minutes.  ____________________________ Hart Rochester Posey Pronto, MD sap:np D: 09/16/2013 14:35:11 ET T: 09/16/2013 17:00:56 ET JOB#: [id number]  cc: Delynn Pursley A. Posey Pronto, MD, <Dictator> Ilda Basset MD ELECTRONICALLY SIGNED 09/29/2013 15:21

## 2014-11-14 NOTE — Consult Note (Signed)
Reason for Visit: This 80 year old Female patient presents to the clinic for initial evaluation of .   Referred by Dr. Smith.  Diagnosis:   Chief Complaint/Diagnosis   72-year-old female with pathologic stage I (T1 B. N0 M0) invasive mammary carcinoma ER/PR positive HER-2/neu negative status post wide local excision and sentinel node biopsy   Pathology Report Pathology report reviewed    Imaging Report Mammograms ultrasound reviewed    Referral Report Clinical notes reviewed    Planned Treatment Regimen Adjuvant right breast radiation    HPI   patient is a 80-year-old female in excellent general health who presented with an abnormal mammogram of the right breast. She has a history of thyroid carcinoma status post resection and adjuvant I-131 treatment back in August of 99. She was seen by Dr. Smith who performed a needle biopsy which was positive for invasive mammary carcinoma. Underwent wide local excision and sentinel node biopsy. Tumor was0.9 cm with margins clear. Tumor was ER/PR positive HER-2/neu negative. Sentinel lymph node was negative. She has had significant pain in her breast after her lumpectomy and still having significant pain and discomfort. She is on chronic aspirin therapy. Patient also does take Plavix. She is otherwise doing well. She has been seen by medical oncology and will be on Aremadex after completion of radiation.  Past Hx:    partial mastectomy:    hysterectomy: 1969   lumbar laminectomy: in 1999 2007   thyroid biopsy with radiation: 2007   lung biopsy: 1981  Past, Family and Social History:   Past Medical History positive    Endocrine Thyroid resection with adjuvant I 31 treatment    Past Surgical History Hysterectomy, lumbar laminectomy, lung biopsy, thyroid resection    Past Medical History Comments Migraine headaches, arthritis, recurrent UTIs    Family History positive    Family History Comments Family history positive for breast cancer  both mother and sister, also strong family history rectal cancer and leukemia    Social History noncontributory    Additional Past Medical and Surgical History Seen by yourself today   Allergies:   Sulfa: Other  Codeine: Other  Aspirin: Unknown  Home Meds:  Home Medications: Medication Instructions Status  Xanax 0.5 mg oral tablet 1 tab(s) orally once a day (at bedtime) x 30 days, As Needed Active  Calcium 600+D 600 mg-200 units tablet 1 tab(s) orally 2 times a day x 30 days Active  anastrozole 1 mg tablet 1 tab(s) orally once a day x 30 days Active  Synthroid 100 mcg (0.1 mg) oral tablet 1 tab(s) orally once a day in am Active  paxil 20mg 1 tab(s)  once a day in am Active  aspirin 81mg 1 tab(s)  once a day in am Active  combivent inhaler  2 puff(s)  3 times a day, As Needed Active  lasix 40mg 1 tab(s)  once a day as needed   Active  toprol 100mg 0.5 tab(s) orally 2 times a day Active  zocor 20mg 1 tab(s) orally once a day (at bedtime) Active  omeprazole 20 mg oral delayed release capsule 1 cap(s) orally once a day Active   Review of Systems:   General negative    Performance Status (ECOG) 0    Skin negative    Breast see HPI    ENMT negative    Respiratory and Thorax negative    Cardiovascular negative    Gastrointestinal negative    Genitourinary negative    Musculoskeletal negative      Neurological negative    Psychiatric negative    Hematology/Lymphatics negative    Endocrine see HPI    Allergic/Immunologic negative   Nursing Notes:  Nursing Vital Signs and Chemo Nursing Nursing Notes: *CC Vital Signs Flowsheet:   03-Jun-13 13:41   Temp Temperature 96   Pulse Pulse 64   Respirations Respirations 18   SBP SBP 154   DBP DBP 650   Current Weight (kg) (kg) 67.8   Height (cm) centimeters 165.1   BSA (m2) 1.7   Physical Exam:  General/Skin/HEENT:   General normal    Skin normal    Eyes normal    ENMT normal    Head and Neck normal     Additional PE Well-developed elderly female in NAD. Breasts are symmetric. Right breast is still tender to the touch. She seems to have developed calcified blood in her lumpectomy site. No other dominant mass or nodularity is noted in either breast into position examined. Lungs are clear to A&P cardiac examination shows regular rate and rhythm.   Breasts/Resp/CV/GI/GU:   Respiratory and Thorax normal    Cardiovascular normal    Gastrointestinal normal    Genitourinary normal   MS/Neuro/Psych/Lymph:   Musculoskeletal normal    Neurological normal    Lymphatics normal   Assessment and Plan:  Impression:   pathologic stage I invasive mammary carcinoma the right breast status post wide local excision and sentinel node biopsy in 80-year-old female ER/PR positive HER-2/neu negative not to receive adjuvant chemotherapy.  Plan:   I discussed treatment options with the patient including accelerated partial breast and radiation with MammoSite catheter placed as well as whole breast radiation. She is reluctant to undergo any further surgery based on the significant pain and discomfort she is experienced. I will go ahead and planned 5000 cGy of external beam treatment to her right breast. We will also boost or scar another 1400 cGy. Her margins initially were close at 1 mm although on slight reexcision margins were negative. Patient will also start Aremadex after completion of radiation and I told her to hold off on that until radiation is complete. I have scheduled her for CT simulation later this week.  I would like to take this opportunity to thank you for allowing me to continue to participate in this patient's care.  CC Referral:   cc: Dr. Smith, Dr. David Bronstein   Electronic Signatures: Chrystal, Glenn S (MD)  (Signed 03-Jun-13 15:53)  Authored: HPI, Diagnosis, Past Hx, PFSH, Allergies, Home Meds, ROS, Nursing Notes, Physical Exam, Encounter Assessment and Plan, CC Referring  Physician   Last Updated: 03-Jun-13 15:53 by Chrystal, Glenn S (MD) 

## 2014-11-14 NOTE — Op Note (Signed)
PATIENT NAME:  Rose Irwin, Rose Irwin MR#:  [id number] DATE OF BIRTH:  1932-02-04  DATE OF PROCEDURE:  11/21/2011  PREOPERATIVE DIAGNOSIS: Right breast cancer.   POSTOPERATIVE DIAGNOSIS: Right breast cancer.   PROCEDURE: Right partial mastectomy with axillary sentinel lymph node biopsy.   SURGEON: Rochel Brome, M.D.   ANESTHESIA: General.   INDICATIONS: This 81 year old female recently had an abnormal mammogram with findings of a density in the lateral aspect of the right breast. She had ultrasound-guided core biopsy which demonstrated an infiltrating mammary carcinoma. The nodule was some 8.7 cm in dimension. She had preoperative ultrasound-guided insertion of a Kopans wire with follow-up mammogram. She had preoperative injection of radioactive technetium sulfur colloid.  DESCRIPTION OF PROCEDURE: The patient was placed on the operating table in the supine position under general anesthesia. The right arm was extended on a lateral arm rest. The dressing was removed from the lateral aspect of the right breast exposing the Kopans wire which was cut 3 cm from the skin. The wire entered the peripheral aspect of the breast at approximately the 9 o'clock position. The breast was prepared with ChloraPrep and draped in a sterile manner.   Next, the gamma counter was used to probe the inferior aspect of the axilla demonstrating location of radioactivity. An oblique incision was made some 4 cm in length, in the inferior aspect of the axilla, and carried down through subcutaneous tissues through superficial fascia. One vein was divided between 4-0 chromic ligatures and an artery was divided between 4-0 chromic ligatures. Dissection was carried down into the axillary fat pad using the gamma counter for direction and demonstrated the location of a lymph node adjacent to the rib cage deep within the inferior aspect of the axilla. This lymph node was approximately 5 to 6 mm in dimension and was dissected free from  surrounding structures including some fatty tissue with it. The ex vivo count per second was in the range of 150 to 170. The lymph node was submitted for pathology. The background count was less than 5. There was no palpable mass within the axilla. A moist sponge was placed into the wound.   Attention was turned to do the right partial mastectomy. The Kopans wire was again noted. The mammograms were viewed. Ultrasound was used to demonstrate location of the density, at the 9 o'clock  position of the right breast. Next, a curvilinear incision was made from approximately the 8 o'clock  to 10 o'clock  position to remove an ellipse of skin approximately 12 mm wide. This was carried down through subcutaneous tissues and further identified the mass numerous times with ultrasound and dissected around the mass, around the wire, down deep into the breast, down adjacent to the deep fascia. The specimen was tagged at the 2 o'clock position of the skin ellipse with a 3-0 nylon stitch and also margin markers were sutured to the wound for the pathologist's orientation, and it was submitted for pathology.   The pathologist did call back to indicate that the sentinel lymph node appeared to be free of micrometastasis. The axillary wound was inspected. Hemostasis was intact. The wound was closed with a running 5-0 Monocryl subcuticular suture.   Next, the partial mastectomy wound was further inspected. Numerous small bleeding points were cauterized. Hemostasis was subsequently intact. Subcutaneous tissues for both wounds were infiltrated with 0.5% Sensorcaine with epinephrine and also in the partial mastectomy wound some of the deeper tissues were infiltrated as well. Next, the subcutaneous tissues were approximated with  interrupted 4-0 chromic. The skin was closed with running 5-0 Monocryl subcuticular suture. During the course of the closure, the pathologist called back to say that all margins were clear, except deep margin  was close. However, it did not appear that there was additional breast tissue remaining in the deep margin and did not identify any other palpable mass.   The wound was then completely closed with 5-0 Monocryl subcuticular suture. Both wounds were treated with Dermabond. The patient tolerated surgery satisfactorily and was then prepared for transfer to the recovery room. ____________________________ Lenna Sciara. Rochel Brome, MD jws:slb D: 11/21/2011 13:31:24 ET     T: 11/21/2011 14:38:12 ET        JOB#: [id number] cc: Loreli Dollar, MD, <Dictator> Loreli Dollar MD ELECTRONICALLY SIGNED 11/21/2011 23:24

## 2014-11-30 ENCOUNTER — Ambulatory Visit
Admission: RE | Admit: 2014-11-30 | Discharge: 2014-11-30 | Disposition: A | Discharge status: Home / Self Care | Payer: Medicare Other | Source: Ambulatory Visit | Attending: Hematology and Oncology | Admitting: Hematology and Oncology

## 2014-11-30 DIAGNOSIS — Z1231 Encounter for screening mammogram for malignant neoplasm of breast: Secondary | ICD-10-CM | POA: Diagnosis not present

## 2014-11-30 DIAGNOSIS — M810 Age-related osteoporosis without current pathological fracture: Secondary | ICD-10-CM | POA: Insufficient documentation

## 2014-12-02 NOTE — Progress Notes (Signed)
Informed pt of bone scan results of osteoporosis and treatment had not been discussed; made Dr. Mike Gip aware

## 2014-12-03 ENCOUNTER — Telehealth: Payer: Self-pay

## 2014-12-03 NOTE — Telephone Encounter (Signed)
-----   Message from Lequita Asal, MD sent at 12/03/2014  4:43 AM EDT ----- Please let the patient know that the Arimidex can make her osteoporosis worse.  Osteoporosis puts her at risk for fractures.  We can get her preauthorized for Prolia (injection every 6 months) or Bisphosphonate pills (Fosamax, Boniva or Actonel).  Pills can be problematic if she has reflux.  Need to make sure she has had a recent dental  exam secondary to small risk of osteonecrosis.  If she wants to discuss further, we can make her an appointment.  M

## 2014-12-10 ENCOUNTER — Telehealth: Payer: Self-pay | Admitting: Hematology and Oncology

## 2014-12-10 NOTE — Telephone Encounter (Signed)
She wants to answer Dr. Kem Parkinson questions about whether or not she wants to start on injections. Please call.

## 2014-12-13 NOTE — Telephone Encounter (Signed)
Dr. Mike Gip, Please contact pt regarding injections.

## 2014-12-22 NOTE — Telephone Encounter (Signed)
Patient needs to know what the injections are for so she can decide if she will take them or not. Financial issues are a consideration so she wants to know if Dr. Mike Gip thinks this is absolutely necessary. Please advise.

## 2014-12-22 NOTE — Telephone Encounter (Signed)
  Spoke with the patient.  She is considering Prolia for osteoporosis. Please preauth Prolia.  She needs to have a dental exam before she starts.   She states her "teeth are crumbing". I asked her to ask her dentist to contact me.  M

## 2014-12-31 ENCOUNTER — Telehealth: Payer: Self-pay | Admitting: *Deleted

## 2014-12-31 MED ORDER — ALPRAZOLAM 0.5 MG PO TABS: 0.5000 mg | ORAL_TABLET | Freq: Two times a day (BID) | ORAL | Status: DC | PRN

## 2014-12-31 NOTE — Telephone Encounter (Signed)
Called in.

## 2015-01-03 DIAGNOSIS — J449 Chronic obstructive pulmonary disease, unspecified: Secondary | ICD-10-CM | POA: Insufficient documentation

## 2015-01-17 DIAGNOSIS — R0602 Shortness of breath: Secondary | ICD-10-CM | POA: Insufficient documentation

## 2015-01-18 ENCOUNTER — Inpatient Hospital Stay: Admission: RE | Admit: 2015-01-18 | Payer: Medicare Other | Source: Ambulatory Visit

## 2015-01-18 DIAGNOSIS — R002 Palpitations: Secondary | ICD-10-CM | POA: Insufficient documentation

## 2015-01-20 ENCOUNTER — Encounter
Admission: RE | Admit: 2015-01-20 | Discharge: 2015-01-20 | Disposition: A | Discharge status: Home / Self Care | Payer: Medicare Other | Source: Ambulatory Visit | Attending: Urology | Admitting: Urology

## 2015-01-20 DIAGNOSIS — Z01812 Encounter for preprocedural laboratory examination: Secondary | ICD-10-CM | POA: Diagnosis present

## 2015-01-20 DIAGNOSIS — R310 Gross hematuria: Secondary | ICD-10-CM | POA: Insufficient documentation

## 2015-01-20 HISTORY — DX: Malignant (primary) neoplasm, unspecified: C80.1

## 2015-01-20 HISTORY — DX: Anxiety disorder, unspecified: F41.9

## 2015-01-20 HISTORY — DX: Chronic obstructive pulmonary disease, unspecified: J44.9

## 2015-01-20 HISTORY — DX: Depression, unspecified: F32.A

## 2015-01-20 HISTORY — DX: Other specified disorders of bone density and structure, unspecified site: M85.80

## 2015-01-20 HISTORY — DX: Essential (primary) hypertension: I10

## 2015-01-20 HISTORY — DX: Hypothyroidism, unspecified: E03.9

## 2015-01-20 HISTORY — DX: Polyneuropathy, unspecified: G62.9

## 2015-01-20 HISTORY — DX: Bronchitis, not specified as acute or chronic: J40

## 2015-01-20 HISTORY — DX: Major depressive disorder, single episode, unspecified: F32.9

## 2015-01-20 HISTORY — DX: Gastro-esophageal reflux disease without esophagitis: K21.9

## 2015-01-20 HISTORY — DX: Hyperlipidemia, unspecified: E78.5

## 2015-01-20 HISTORY — DX: Unspecified osteoarthritis, unspecified site: M19.90

## 2015-01-20 HISTORY — DX: Adverse effect of unspecified anesthetic, initial encounter: T41.45XA

## 2015-01-20 HISTORY — DX: Unspecified hemorrhoids: K64.9

## 2015-01-20 HISTORY — DX: Other complications of anesthesia, initial encounter: T88.59XA

## 2015-01-20 HISTORY — DX: Unspecified malignant neoplasm of skin, unspecified: C44.90

## 2015-01-20 LAB — CBC
HCT: 40.4 % (ref 35.0–47.0)
Hemoglobin: 13.2 g/dL (ref 12.0–16.0)
MCH: 27.7 pg (ref 26.0–34.0)
MCHC: 32.8 g/dL (ref 32.0–36.0)
MCV: 84.5 fL (ref 80.0–100.0)
Platelets: 212 10*3/uL (ref 150–440)
RBC: 4.78 MIL/uL (ref 3.80–5.20)
RDW: 13.5 % (ref 11.5–14.5)
WBC: 5.4 10*3/uL (ref 3.6–11.0)

## 2015-01-20 LAB — BASIC METABOLIC PANEL
Anion gap: 7 (ref 5–15)
BUN: 12 mg/dL (ref 6–20)
CALCIUM: 9.3 mg/dL (ref 8.9–10.3)
CO2: 31 mmol/L (ref 22–32)
Chloride: 102 mmol/L (ref 101–111)
Creatinine, Ser: 0.66 mg/dL (ref 0.44–1.00)
GFR calc Af Amer: >60 mL/min (ref >60)
GFR calc non Af Amer: >60 mL/min (ref >60)
GLUCOSE: 113 mg/dL — AB (ref 65–99)
Potassium: 4.1 mmol/L (ref 3.5–5.1)
SODIUM: 140 mmol/L (ref 135–145)

## 2015-01-20 NOTE — Patient Instructions (Signed)
  Your procedure is scheduled on: 02/02/15 Wed  Report to Day Surgery. To find out your arrival time please call [phone] between 1PM - 3PM on 02/01/15 Tues.  Remember: Instructions that are not followed completely may result in serious medical risk, up to and including death, or upon the discretion of your surgeon and anesthesiologist your surgery may need to be rescheduled.    _x___ 1. Do not eat food or drink liquids after midnight. No gum chewing or hard candies.     ____ 2. No Alcohol for 24 hours before or after surgery.   ____ 3. Bring all medications with you on the day of surgery if instructed.    _x__ 4. Notify your doctor if there is any change in your medical condition     (cold, fever, infections).     Do not wear jewelry, make-up, hairpins, clips or nail polish.  Do not wear lotions, powders, or perfumes. You may wear deodorant.  Do not shave 48 hours prior to surgery. Men may shave face and neck.  Do not bring valuables to the hospital.    [facility] is not responsible for any belongings or valuables.               Contacts, dentures or bridgework may not be worn into surgery.  Leave your suitcase in the car. After surgery it may be brought to your room.  For patients admitted to the hospital, discharge time is determined by your                treatment team.   Patients discharged the day of surgery will not be allowed to drive home.   Please read over the following fact sheets that you were given:      ____ Take these medicines the morning of surgery with A SIP OF WATER:    1. levothyroxine (SYNTHROID, LEVOTHROID) 100 MCG tablet  2. albuterol-ipratropium (COMBIVENT) 18-103 MCG/ACT inhaler  3. Fluticasone-Salmeterol (ADVAIR) 250-50 MCG/DOSE AEPB  4.losartan (COZAAR) 25 MG tablet  5.metoprolol succinate (TOPROL-XL) 50 MG 24 hr tablet  6.PARoxetine (PAXIL) 40 MG tablet  ____ Fleet Enema (as directed)   ____ Use CHG Soap as directed  _x___ Use inhalers on  the day of surgery  ____ Stop metformin 2 days prior to surgery    ____ Take 1/2 of usual insulin dose the night before surgery and none on the morning of surgery.   ____ Stop Coumadin/Plavix/aspirin on Stopped aspirin on 01/17/15  ____ Stop Anti-inflammatories on   ____ Stop supplements until after surgery.    ____ Bring C-Pap to the hospital.

## 2015-01-31 ENCOUNTER — Telehealth: Payer: Self-pay | Admitting: Urology

## 2015-01-31 NOTE — Telephone Encounter (Signed)
Patient's husband is very ill and she needs to reschedule her surgery that is scheduled for Wednesday, 7/13.

## 2015-01-31 NOTE — Telephone Encounter (Signed)
Spoke with patient and she would like to reschedule surgery to next week. Surgery was rescheduled to 02-09-15, patient is in agreement with this date, she already had pre-op

## 2015-02-02 ENCOUNTER — Telehealth: Payer: Self-pay | Admitting: Hematology and Oncology

## 2015-02-02 ENCOUNTER — Encounter: Admission: RE | Payer: Self-pay | Source: Ambulatory Visit

## 2015-02-02 ENCOUNTER — Ambulatory Visit: Admission: RE | Admit: 2015-02-02 | Payer: Medicare Other | Source: Ambulatory Visit | Admitting: Urology

## 2015-02-02 SURGERY — CYSTOSCOPY, WITH RETROGRADE PYELOGRAM
Anesthesia: Choice

## 2015-02-02 NOTE — Telephone Encounter (Signed)
Left voicemail with pt that I was returning her phone call and to call back if she had questions

## 2015-02-02 NOTE — Telephone Encounter (Signed)
She left voicemail asking for [facility]'s nurse to please call her back. No additional details. Thanks.

## 2015-02-03 ENCOUNTER — Other Ambulatory Visit: Payer: Self-pay

## 2015-02-03 MED ORDER — ALPRAZOLAM 0.5 MG PO TABS: 0.5000 mg | ORAL_TABLET | Freq: Two times a day (BID) | ORAL | Status: DC | PRN

## 2015-02-03 NOTE — Telephone Encounter (Signed)
Per Dr. Mike Gip; spoke with pt and informed her Dr. Mike Gip will refill this medication this one time however further refills will need to be filled by her PCP; asked pt to clarify dose she is taking and pt states she takes Xanax 0.5 mg by mouth at bedtime to help her sleep; pt verbalized understanding of this

## 2015-02-03 NOTE — Telephone Encounter (Signed)
Patient said Pharmacy faxed request for xanax (generic) refill to [facility] but that they still have not received the rx from Dr. Mike Gip. She called to ask if you could please send that to [facility]. If you need to call her, [phone]. Thanks!

## 2015-02-04 ENCOUNTER — Other Ambulatory Visit: Payer: Medicare Other

## 2015-02-04 NOTE — Patient Instructions (Signed)
  Your procedure is scheduled on: 02-09-15 Report to [place] To find out your arrival time please call [phone] between 1PM - 3PM on 02-10-15  Remember: Instructions that are not followed completely may result in serious medical risk, up to and including death, or upon the discretion of your surgeon and anesthesiologist your surgery may need to be rescheduled.    _X___ 1. Do not eat food or drink liquids after midnight. No gum chewing or hard candies.     _X___ 2. No Alcohol for 24 hours before or after surgery.   ____ 3. Bring all medications with you on the day of surgery if instructed.    ____ 4. Notify your doctor if there is any change in your medical condition     (cold, fever, infections).     Do not wear jewelry, make-up, hairpins, clips or nail polish.  Do not wear lotions, powders, or perfumes. You may wear deodorant.  Do not shave 48 hours prior to surgery. Men may shave face and neck.  Do not bring valuables to the hospital.    [facility] is not responsible for any belongings or valuables.               Contacts, dentures or bridgework may not be worn into surgery.  Leave your suitcase in the car. After surgery it may be brought to your room.  For patients admitted to the hospital, discharge time is determined by your treatment team.   Patients discharged the day of surgery will not be allowed to drive home.   Please read over the following fact sheets that you were given:      _X___ Take these medicines the morning of surgery with A SIP OF WATER:    1. LEVOTHYROXINE  2. LOSARTAN  3. METOPROLOL  4. PAROXETINE  5.  6.  ____ Fleet Enema (as directed)   ____ Use CHG Soap as directed  __X__ Use inhalers on the day of surgery-USE COMBIVENT AND ADVAIR AND BRING COMBIVENT  ____ Stop metformin 2 days prior to surgery    ____ Take 1/2 of usual insulin dose the night before surgery and none on the morning of surgery.   _X___ Stop  Coumadin/Plavix/aspirin-PT HAS ALREADY STOPPED ASA  ____ Stop Anti-inflammatories-NO NSAIDS OR ASA PRODUCTS-TYLENOL OK   ____ Stop supplements until after surgery.    ____ Bring C-Pap to the hospital.

## 2015-02-09 ENCOUNTER — Ambulatory Visit
Admission: RE | Admit: 2015-02-09 | Discharge: 2015-02-09 | Disposition: A | Discharge status: Home / Self Care | Payer: Medicare Other | Source: Ambulatory Visit | Attending: Urology | Admitting: Urology

## 2015-02-09 ENCOUNTER — Encounter
Admission: RE | Disposition: A | Discharge status: Home / Self Care | Payer: Self-pay | Source: Ambulatory Visit | Attending: Urology

## 2015-02-09 ENCOUNTER — Ambulatory Visit: Payer: Medicare Other | Admitting: Anesthesiology

## 2015-02-09 ENCOUNTER — Encounter: Payer: Self-pay | Admitting: Urology

## 2015-02-09 DIAGNOSIS — Z885 Allergy status to narcotic agent status: Secondary | ICD-10-CM | POA: Diagnosis not present

## 2015-02-09 DIAGNOSIS — E785 Hyperlipidemia, unspecified: Secondary | ICD-10-CM | POA: Diagnosis not present

## 2015-02-09 DIAGNOSIS — I10 Essential (primary) hypertension: Secondary | ICD-10-CM | POA: Diagnosis not present

## 2015-02-09 DIAGNOSIS — M8580 Other specified disorders of bone density and structure, unspecified site: Secondary | ICD-10-CM | POA: Diagnosis not present

## 2015-02-09 DIAGNOSIS — E559 Vitamin D deficiency, unspecified: Secondary | ICD-10-CM | POA: Diagnosis not present

## 2015-02-09 DIAGNOSIS — D485 Neoplasm of uncertain behavior of skin: Secondary | ICD-10-CM | POA: Insufficient documentation

## 2015-02-09 DIAGNOSIS — E039 Hypothyroidism, unspecified: Secondary | ICD-10-CM | POA: Diagnosis not present

## 2015-02-09 DIAGNOSIS — R310 Gross hematuria: Secondary | ICD-10-CM | POA: Diagnosis not present

## 2015-02-09 DIAGNOSIS — F419 Anxiety disorder, unspecified: Secondary | ICD-10-CM | POA: Insufficient documentation

## 2015-02-09 DIAGNOSIS — Z85850 Personal history of malignant neoplasm of thyroid: Secondary | ICD-10-CM | POA: Diagnosis not present

## 2015-02-09 DIAGNOSIS — F329 Major depressive disorder, single episode, unspecified: Secondary | ICD-10-CM | POA: Insufficient documentation

## 2015-02-09 DIAGNOSIS — J449 Chronic obstructive pulmonary disease, unspecified: Secondary | ICD-10-CM | POA: Diagnosis not present

## 2015-02-09 DIAGNOSIS — K219 Gastro-esophageal reflux disease without esophagitis: Secondary | ICD-10-CM | POA: Diagnosis not present

## 2015-02-09 DIAGNOSIS — Z87891 Personal history of nicotine dependence: Secondary | ICD-10-CM | POA: Diagnosis not present

## 2015-02-09 DIAGNOSIS — N362 Urethral caruncle: Secondary | ICD-10-CM | POA: Insufficient documentation

## 2015-02-09 DIAGNOSIS — Z79899 Other long term (current) drug therapy: Secondary | ICD-10-CM | POA: Insufficient documentation

## 2015-02-09 DIAGNOSIS — Z888 Allergy status to other drugs, medicaments and biological substances status: Secondary | ICD-10-CM | POA: Diagnosis not present

## 2015-02-09 DIAGNOSIS — Z87442 Personal history of urinary calculi: Secondary | ICD-10-CM | POA: Diagnosis not present

## 2015-02-09 HISTORY — PX: CYSTOSCOPY W/ RETROGRADES: SHX1426

## 2015-02-09 SURGERY — CYSTOSCOPY, WITH RETROGRADE PYELOGRAM
Anesthesia: General | Wound class: Clean Contaminated

## 2015-02-09 MED ORDER — FAMOTIDINE 20 MG PO TABS
ORAL_TABLET | ORAL | Status: AC
  Administered 2015-02-09: 20 mg via ORAL
  Filled 2015-02-09: qty 1

## 2015-02-09 MED ORDER — IOTHALAMATE MEGLUMINE 43 % IV SOLN
INTRAVENOUS | Status: DC | PRN
  Administered 2015-02-09: 15 mL

## 2015-02-09 MED ORDER — ONDANSETRON HCL 4 MG/2ML IJ SOLN: 4.0000 mg | Freq: Once | INTRAMUSCULAR | Status: DC | PRN

## 2015-02-09 MED ORDER — FENTANYL CITRATE (PF) 100 MCG/2ML IJ SOLN: 25.0000 ug | INTRAMUSCULAR | Status: DC | PRN

## 2015-02-09 MED ORDER — ONDANSETRON HCL 4 MG/2ML IJ SOLN
INTRAMUSCULAR | Status: DC | PRN
  Administered 2015-02-09: 4 mg via INTRAVENOUS

## 2015-02-09 MED ORDER — LIDOCAINE HCL (CARDIAC) 20 MG/ML IV SOLN
INTRAVENOUS | Status: DC | PRN
  Administered 2015-02-09: 50 mg via INTRAVENOUS

## 2015-02-09 MED ORDER — LACTATED RINGERS IV SOLN
INTRAVENOUS | Status: DC
Start: 2015-02-09 — End: 2015-02-09
  Administered 2015-02-09: 11:00:00 via INTRAVENOUS

## 2015-02-09 MED ORDER — DEXAMETHASONE SODIUM PHOSPHATE 4 MG/ML IJ SOLN
INTRAMUSCULAR | Status: DC | PRN
  Administered 2015-02-09: 10 mg via INTRAVENOUS

## 2015-02-09 MED ORDER — LACTATED RINGERS IV SOLN
INTRAVENOUS | Status: DC | PRN
  Administered 2015-02-09: 11:00:00 via INTRAVENOUS

## 2015-02-09 MED ORDER — CEFAZOLIN SODIUM 1-5 GM-% IV SOLN
INTRAVENOUS | Status: AC
  Administered 2015-02-09: 1 g via INTRAVENOUS
  Filled 2015-02-09: qty 50

## 2015-02-09 MED ORDER — FAMOTIDINE 20 MG PO TABS
20.0000 mg | ORAL_TABLET | Freq: Once | ORAL | Status: AC
  Administered 2015-02-09: 20 mg via ORAL

## 2015-02-09 MED ORDER — PROPOFOL 10 MG/ML IV BOLUS
INTRAVENOUS | Status: DC | PRN
  Administered 2015-02-09: 140 mg via INTRAVENOUS

## 2015-02-09 MED ORDER — MIDAZOLAM HCL 2 MG/2ML IJ SOLN
INTRAMUSCULAR | Status: DC | PRN
  Administered 2015-02-09: 1 mg via INTRAVENOUS

## 2015-02-09 MED ORDER — CEFAZOLIN SODIUM 1-5 GM-% IV SOLN
1.0000 g | Freq: Once | INTRAVENOUS | Status: AC
  Administered 2015-02-09: 1 g via INTRAVENOUS

## 2015-02-09 MED ORDER — FENTANYL CITRATE (PF) 100 MCG/2ML IJ SOLN
INTRAMUSCULAR | Status: DC | PRN
  Administered 2015-02-09: 50 ug via INTRAVENOUS

## 2015-02-09 SURGICAL SUPPLY — 26 items
BAG DRAIN CYSTO-URO LG1000N (MISCELLANEOUS) ×4 IMPLANT
CATH URETL 5X70 OPEN END (CATHETERS) ×4 IMPLANT
CNTNR SPEC 2.5X3XGRAD LEK (MISCELLANEOUS) ×2
CONRAY 43 FOR UROLOGY 50M (MISCELLANEOUS) ×4 IMPLANT
CONT SPEC 4OZ STER OR WHT (MISCELLANEOUS) ×2
CONTAINER SPEC 2.5X3XGRAD LEK (MISCELLANEOUS) ×2 IMPLANT
CORD URO TURP 10FT (MISCELLANEOUS) ×4 IMPLANT
GLOVE BIO SURGEON STRL SZ 6.5 (GLOVE) ×3 IMPLANT
GLOVE BIO SURGEON STRL SZ7 (GLOVE) ×8 IMPLANT
GLOVE BIO SURGEONS STRL SZ 6.5 (GLOVE) ×1
GOWN STRL REUS W/ TWL LRG LVL3 (GOWN DISPOSABLE) ×4 IMPLANT
GOWN STRL REUS W/TWL LRG LVL3 (GOWN DISPOSABLE) ×4
JELLY LUB 2OZ STRL (MISCELLANEOUS) ×2
JELLY LUBE 2OZ STRL (MISCELLANEOUS) ×2 IMPLANT
KIT RM TURNOVER CYSTO AR (KITS) ×4 IMPLANT
PACK CYSTO AR (MISCELLANEOUS) ×4 IMPLANT
PAD GROUND ADULT SPLIT (MISCELLANEOUS) ×4 IMPLANT
PREP PVP WINGED SPONGE (MISCELLANEOUS) ×4 IMPLANT
PUMP SINGLE ACTION SAP (PUMP) ×4 IMPLANT
SENSORWIRE 0.038 NOT ANGLED (WIRE) ×4
SET CYSTO W/LG BORE CLAMP LF (SET/KITS/TRAYS/PACK) ×4 IMPLANT
SOL .9 NS 3000ML IRR  AL (IV SOLUTION) ×2
SOL .9 NS 3000ML IRR UROMATIC (IV SOLUTION) ×2 IMPLANT
WATER STERILE IRR 1000ML POUR (IV SOLUTION) ×4 IMPLANT
WATER STERILE IRR 3000ML UROMA (IV SOLUTION) ×4 IMPLANT
WIRE SENSOR 0.038 NOT ANGLED (WIRE) ×2 IMPLANT

## 2015-02-09 NOTE — Interval H&P Note (Signed)
History and Physical Interval Note:  02/09/2015 10:46 AM  Rose Irwin  has presented today for surgery, with the diagnosis of GROSS HEMATOMA  The various methods of treatment have been discussed with the patient and family. After consideration of risks, benefits and other options for treatment, the patient has consented to  Procedure(s): CYSTOSCOPY WITH RETROGRADE PYELOGRAM (Bilateral) CYSTOSCOPY WITH BIOPSY (N/A) as a surgical intervention .  The patient's history has been reviewed, patient examined, no change in status, stable for surgery.  I have reviewed the patient's chart and labs.  Questions were answered to the patient's satisfaction.    Patient has been rescheduled multiple times due to clearance, patient's schedule, etc.     RRR CTAB  Hollice Espy

## 2015-02-09 NOTE — Discharge Instructions (Addendum)
AMBULATORY SURGERY  DISCHARGE INSTRUCTIONS   1) The drugs that you were given will stay in your system until tomorrow so for the next 24 hours you should not:  A) Drive an automobile B) Make any legal decisions C) Drink any alcoholic beverage   2) You may resume regular meals tomorrow.  Today it is better to start with liquids and gradually work up to solid foods.  You may eat anything you prefer, but it is better to start with liquids, then soup and crackers, and gradually work up to solid foods.   3) Please notify your doctor immediately if you have any unusual bleeding, trouble breathing, redness and pain at the surgery site, drainage, fever, or pain not relieved by medication.    4) Additional Instructions:        Please contact your physician with any problems or Same Day Surgery at [phone], Monday through Friday 6 am to 4 pm, or [place] at [facility] number at [phone].   Cystoscopy, Care After Refer to this sheet in the next few weeks. These instructions provide you with information on caring for yourself after your procedure. Your caregiver may also give you more specific instructions. Your treatment has been planned according to current medical practices, but problems sometimes occur. Call your caregiver if you have any problems or questions after your procedure. HOME CARE INSTRUCTIONS  Things you can do to ease any discomfort after your procedure include:  Drinking enough water and fluids to keep your urine clear or pale yellow.  Taking a warm bath to relieve any burning feelings. SEEK IMMEDIATE MEDICAL CARE IF:   You have an increase in blood in your urine.  You notice blood clots in your urine.  You have difficulty passing urine.  You have the chills.  You have abdominal pain.  You have a fever or persistent symptoms for more than 2-3 days.  You have a fever and your symptoms suddenly get worse. MAKE SURE YOU:   Understand these  instructions.  Will watch your condition.  Will get help right away if you are not doing well or get worse. Document Released: 01/26/2005 Document Revised: 03/11/2013 Document Reviewed: 12/31/2011 [facility] Patient Information 2015 [place], [place]. This information is not intended to replace advice given to you by your health care provider. Make sure you discuss any questions you have with your health care provider.

## 2015-02-09 NOTE — H&P (Signed)
Rose Irwin 11/18/2014 10:30 AM Location: [facility] Patient #: [id number] DOB: 1932/02/06 Married / Language: Rose Irwin / Race: White Female    History of Present Illness(Shannon A McGowan, PA-C; 11/19/2014 3:50 PM) The patient is a 81 year old female presenting to discuss diagnostic procedure results. The patient had a CT scan (CT Urogram). The diagnostic test was performed on - Date: (11/11/2014). Current symptoms include other (Patient initially presented to [place] with flank pain, nocturia and one episode of micro heme from her PCP. Her flank pain and nocturia had improved, but she was scheduled to RTC for f/u ua. She had subsequently developed gross hematuria in the internum, so a CT Urogram was performed. ). Note for "Follow up diagnostic procedure": REASON FOR EXAM: UROGRAM Hematuria Work up Hematuria COMMENTS: PROCEDURE: KCT - KCT ABDOMEN/PELVIS W/WO - Nov 11 2014 11:40AM CLINICAL DATA: Microscopic hematuria found 1 month ago during routine physical examination. Patient has also been having gross hematuria intermittently. EXAM: CT ABDOMEN AND PELVIS WITHOUT AND WITH CONTRAST TECHNIQUE: Multidetector CT imaging of the abdomen and pelvis was performed following the standard protocol before and following the bolus administration of intravenous contrast. CONTRAST: 125 cc Omnipaque 300 COMPARISON: None. FINDINGS: Lower chest: The lung bases are clear of acute process. Patchy peripheral interstitial changes are noted. No focal infiltrate or pleural effusion. No worrisome pulmonary lesion. Hepatobiliary: No focal hepatic lesions or intrahepatic biliary dilatation. The gallbladder is normal. No common bowel duct dilatation. Pancreas: Normal. Spleen: Normal. Adrenals/Urinary Tract: The adrenal glands are unremarkable. No renal or obstructing ureteral calculi or bladder calculi. Both kidneys demonstrate normal enhancement/ perfusion following  contrast administration. No worrisome renal lesions. The delayed images do not demonstrate any collecting system abnormalities. Both ureters are normal. The bladder is normal. Stomach/Bowel: The stomach, duodenum, small bowel and colon are grossly normal. No inflammatory changes, mass lesions or obstructive findings. Vascular/Lymphatic: No mesenteric or retroperitoneal mass or adenopathy. Moderate to advanced atherosclerotic calcifications involving the aorta. No focal aneurysm or dissection. Other: The bladder is unremarkable. There are prominent parametrial pelvic vessels which could suggest pelvic congestion syndrome. The uterus is surgically absent. Both ovaries are still present. There is a small cyst associated with the right ovary. No pelvic mass or adenopathy. No free pelvic fluid collections. Musculoskeletal: No significant bony findings. IMPRESSION: No CT findings to account for the patient's hematuria. No renal, ureteral or bladder calculi or mass. No acute abdominal/ pelvic findings, mass lesions or adenopathy. Prominent parametrial/pelvic vessels possibly reflecting pelvic congestion syndrome. Electronically Signed By: Marijo Sanes M.D.  I have reviewed the films with the patient.       Problem List/Past Medical(Rose Irwin; 11/18/2014 10:05 AM) History of thyroid cancer (V10.87  Z85.850) Osteopenia (733.90  M85.80) Breast cancer (174.9  C50.919) Numbness (782.0  R20.0) Fatigue (780.79  R53.83) Depression (311  F32.9) Hepatitis (573.3  K75.9) KA (keratoacanthoma) (238.2  L85.8) Tobacco use (305.1  Z72.0) Gross hematuria (599.71  R31.0) Urethral caruncle (599.3  N36.2) Flank pain (789.09  R10.9) Microscopic hematuria (599.72  R31.2). Patient was found to have 0-3RBCs/hpf on 09/20/2014 and 09/27/2014 at her PCP's office. She was found to have 3-10RBCs/hpf in our office today. She denies any gross hematuria. Arthritis (716.90   M19.90) History of nephrolithiasis (V13.01  Z87.442) Anxiety (300.00  F41.1) Hematuria (599.70  R31.9) GERD (gastroesophageal reflux disease) (530.81  K21.9) Hyperlipidemia (272.4  E78.5) History of hemorrhoids (V13.89  Z87.898) Vitamin D deficiency (268.9  E55.9) Hypertension (401.9  I10) Hypothyroidism, acquired, autoimmune (244.8  E03.8) COPD (chronic obstructive pulmonary disease) (496  J44.9)    Allergies(Rose Irwin; 11/18/2014 10:05 AM) Codeine Sulfate *ANALGESICS - OPIOID*. Unable to function. Aspirin *ANALGESICS - NonNarcotic* SulfADIAZINE *SULFONAMIDES*. Swelling.    Family History(Rose Irwin; 11/18/2014 10:05 AM) Kidney Disease. Father. Breast cancer. Mother, Sister. Colon Cancer. Brother. Lung Cancer. Brother.    Social History(Rose Irwin; 11/18/2014 10:05 AM) Alcohol use. Non-drinker. Tobacco use. Smoker, current status unknown. 1/2 pk for 30 years    Travel History(Rose Irwin; 11/18/2014 10:05 AM) Have you traveled internationally in the last 21 days?. No.    Medication History(Rose Irwin; 11/18/2014 10:05 AM) Premarin (0.625MG /GM Cream, Vaginal pea size nightly) Active. Losartan Potassium-HCTZ (50-12.5MG  Tablet, 1 Oral daily) Active. Advair Diskus (250-50MCG/DOSE Aero Pow Br Act, Inhalation) Active. ALPRAZolam (0.5MG  Tablet Disperse, Oral) Active. Anastrozole (1MG  Tablet, Oral) Active. Aspirin EC Low Strength (81MG  Tablet DR, Oral) Active. Combivent Respimat (20-100MCG/ACT Aerosol Soln, Inhalation) Active. Levothyroxine Sodium (100MCG Tablet, Oral) Active. Flonase Allergy Relief (50MCG/ACT Suspension, Nasal) Active. Metoprolol Tartrate (50MG  Tablet, Oral) Active. PARoxetine HCl (40MG  Tablet, Oral) Active. Simvastatin (20MG  Tablet, Oral) Active. Triamcinolone Acetonide (0.1% Ointment, External) Active.    Past Surgical History(Rose Irwin; 11/18/2014 10:05 AM) Amalia Hailey Mastectomy-  Right partial Thyroid resection Back Surgery Aspiration Breast cyst Urethral Bulking Hysterectomy    Health Maintenance History(Rose Irwin; 11/18/2014 10:05 AM) Colonoscopy (40973). 2013    Review of Systems(Rose Jimmye Norman; 11/18/2014 10:05 AM) General:Present- Fatigueand Night Sweats. Not Present- Chills, Fever and Weight Gain > 10lbs.. Skin:Not Present- Hair Loss, Pruritus and Rash. HEENT:Present- Sinus Pain. Not Present- Eye Pain, Decreased Hearing, Runny Nose, Snoring and Dry Mucous Membranes. Neck:Not Present- Neck Pain and Swollen Glands. Respiratory:Present- Coughand Shortness of Breath. Not Present- Chronic Cough, Difficulty Breathing on Exertion and Wakes up from Sleep Wheezing or Short of Breath. Cardiovascular:Present- Leg Pain and/or Swelling. Not Present- Edema, Palpitations and Shortness of Breath. Gastrointestinal:Not Present- Constipation, Diarrhea, Nausea and Vomiting. Female Genitourinary:Present- Hematuria, Nocturia, Urgencyand Urine Leakage. Note:See HPI Musculoskeletal:Present- Back Painand Joint Pain. Neurological:Present- Headaches. Not Present- Decreased Memory and Seizures. Psychiatric:Present- Anxietyand Depression. Endocrine:Not Present- Excessive Sweating, Heat Intolerance and Tired/Sluggish. Hematology:Present- Easy Bruising. Not Present- Abnormal Bleeding, Anemia, Blood Transfusion and Enlarged Lymph Nodes.    Vitals(Rose Irwin; 11/18/2014 10:22 AM) 11/18/2014 10:21 AM Weight: 144.5 lb Height: 65 in Height was reported by patient. Body Surface Area: 1.73 m Body Mass Index: 24.05 kg/m Pulse: 70 (Regular) BP: 152/63 (Sitting, Left Arm, Standard)     Assessment & Plan(Shannon A McGowan, PA-C; 11/19/2014 3:51 PM) Gross hematuria (599.71  R31.0) Story: Patient was being followed for [facility], but she developed gross hematuria. She underwent a CT Urogram on 11/11/2014 which demonstrated IMPRESSION: No CT  findings to account for the patient's hematuria. No renal, ureteral or bladder calculi or mass. No acute abdominal/ pelvic findings, mass lesions or adenopathy. Prominent parametrial/pelvic vessels possibly reflecting pelvic congestion syndrome. But when I reviewed the films with the patient, both ureters were not completely opacified. Impression: I explained to the patinet the importance of having cystoscopy with bilateral retrogrades to complete the hematuria work up. I described to the patient how the procedure is performed and the risks associated with the procedure, such as: infection, bleeding, uncomfortablness with the first few days after the procedure, the possibility of a biopsy of an area of concern in the ureters and/or stent placement. I explained stent pain: About 50% of patients who undergo ureteroscopy and have a stent will have "stent pain," and this is by far the most common risk/complaint following  ureteroscopy. A stent is a soft plastic tube (about half the size of IV tubing) that allows the kidney to drain to the bladder regardless of edema or obstruction. Not only can the stent "rub" on the inside of the bladder, causing a feeling of needing to urinate/overactive bladder, but also the stent allows urine to pass up from the bladder to the kidney during urination - causing symptoms from a warm, tingling sensation to intense pain in the affected flank. I also explained the risks of general anesthesia, such as: MI, CVA, paralysis, coma and/or death. Current Plans l Pt Education - How to access health information online: discussed with patient and provided information. l URINALYSIS, AUTOMATED W/ MICRO (- [facility] -) (81001) l URINE CULTURE, COMPREHENSIVE (16109)  Ovarian cyst (620.2  N83.20) Impression: Ovarian cyst was seen on her CT Urogram which is abnormal in a post menopausal woman. We will refer to gynecology for further evaluation. She is requesting Dane[facility] Dan and a female practitioner.  Tobacco use (305.1  Z72.0) Impression: Discussed with patient that she is at higher risk for GU malignancy due to her smoking.  Hypertension (401.9  I10)   Signed electronically by Nori Riis, PA-C (11/19/2014 3:52 PM)

## 2015-02-09 NOTE — Transfer of Care (Signed)
Immediate Anesthesia Transfer of Care Note  Patient: Rose Irwin  Procedure(s) Performed: Procedure(s): CYSTOSCOPY WITH RETROGRADE PYELOGRAM (Bilateral)  Patient Location: PACU  Anesthesia Type:General  Level of Consciousness: awake, alert , oriented and patient cooperative  Airway & Oxygen Therapy: Patient Spontanous Breathing and Patient connected to face mask oxygen  Post-op Assessment: Report given to RN, Post -op Vital signs reviewed and stable and Patient moving all extremities X 4  Post vital signs: Reviewed and stable  Last Vitals:  Filed Vitals:   02/09/15 1147  BP: 137/57  Pulse: 74  Temp: 36.3 C  Resp: 15    Complications: No apparent anesthesia complications

## 2015-02-09 NOTE — Anesthesia Postprocedure Evaluation (Signed)
  Anesthesia Post-op Note  Patient: Rose Irwin  Procedure(s) Performed: Procedure(s): CYSTOSCOPY WITH RETROGRADE PYELOGRAM (Bilateral)  Anesthesia type:General  Patient location: PACU  Post pain: Pain level controlled  Post assessment: Post-op Vital signs reviewed, Patient's Cardiovascular Status Stable, Respiratory Function Stable, Patent Airway and No signs of Nausea or vomiting  Post vital signs: Reviewed and stable  Last Vitals:  Filed Vitals:   02/09/15 1147  BP: 137/57  Pulse: 74  Temp: 36.3 C  Resp: 15    Level of consciousness: awake, alert  and patient cooperative  Complications: No apparent anesthesia complications

## 2015-02-09 NOTE — Anesthesia Procedure Notes (Signed)
Procedure Name: LMA Insertion Date/Time: 02/09/2015 11:25 AM Performed by: Silvana Newness Pre-anesthesia Checklist: Patient identified, Emergency Drugs available, Suction available, Patient being monitored and Timeout performed Patient Re-evaluated:Patient Re-evaluated prior to inductionOxygen Delivery Method: Circle system utilized Preoxygenation: Pre-oxygenation with 100% oxygen Intubation Type: IV induction Ventilation: Mask ventilation without difficulty LMA: LMA inserted LMA Size: 3.5 Number of attempts: 1 Placement Confirmation: positive ETCO2 and breath sounds checked- equal and bilateral Tube secured with: Tape Dental Injury: Teeth and Oropharynx as per pre-operative assessment

## 2015-02-09 NOTE — Anesthesia Preprocedure Evaluation (Addendum)
Anesthesia Evaluation   Patient awake    Reviewed: Allergy & Precautions  Airway Mallampati: III       Dental  (+) Edentulous Upper, Edentulous Lower   Pulmonary COPDformer smoker,  + rhonchi   + decreased breath sounds      Cardiovascular hypertension, Pt. on medications and Pt. on home beta blockers Normal cardiovascular exam    Neuro/Psych Anxiety Depression    GI/Hepatic GERD-  ,  Endo/Other  Hypothyroidism   Renal/GU      Musculoskeletal  (+) Arthritis -, Osteoarthritis,    Abdominal   Peds  Hematology   Anesthesia Other Findings   Reproductive/Obstetrics negative OB ROS                            Anesthesia Physical Anesthesia Plan  ASA: III  Anesthesia Plan: General   Post-op Pain Management:    Induction: Intravenous  Airway Management Planned: LMA  Additional Equipment:   Intra-op Plan:   Post-operative Plan: Extubation in OR  Informed Consent: I have reviewed the patients History and Physical, chart, labs and discussed the procedure including the risks, benefits and alternatives for the proposed anesthesia with the patient or authorized representative who has indicated his/her understanding and acceptance.     Plan Discussed with: CRNA  Anesthesia Plan Comments:         Anesthesia Quick Evaluation

## 2015-02-09 NOTE — Brief Op Note (Signed)
02/09/2015  12:03 PM  PATIENT:  Rose Irwin  81 y.o. female  PRE-OPERATIVE DIAGNOSIS:  GROSS HEMATOMA  POST-OPERATIVE DIAGNOSIS:  GROSS HEMATOMA  PROCEDURE:  Procedure(s): CYSTOSCOPY WITH RETROGRADE PYELOGRAM (Bilateral)  SURGEON:  Surgeon(s) and Role:    * Hollice Espy, MD - Primary  ASSISTANTS: none   ANESTHESIA:   general  EBL:  Total I/O In: 500 [I.V.:500] Out: 5 [Blood:5]  Drains: none  Specimen: urine cytology  COUNTS CORRECT: YES  PLAN OF CARE: Discharge to home after PACU  PATIENT DISPOSITION:  PACU - hemodynamically stable.

## 2015-02-09 NOTE — Op Note (Signed)
Date of procedure: 02/09/2015  Preoperative diagnosis:  1. Gross hematuria   Postoperative diagnosis:  1. Same as above   Procedure: 1. Cystoscopy 2. Bilateral retrograde pyelogram  Surgeon: Hollice Espy, MD  Anesthesia: General  Complications: None  Intraoperative findings: Normal bilateral retrogrades. No tumors or masses identified on cystoscopy.  EBL: Minimal  Specimens: Urine cytology  Drains: None  Indication: Rose Irwin is a 81 y.o. patient with history of smoking a gross hematuria here to complete her hematuria workup. She noted CT urogram which was essentially negative, however, the distal ureters were not completely opacified and the study. Given her relatively high risk and continued smoking, she was counseled to proceed to the operating room for cystoscopy, bilateral retrograde pyelogram to complete her workup.  After reviewing the management options for treatment, she elected to proceed with the above surgical procedure(s). We have discussed the potential benefits and risks of the procedure, side effects of the proposed treatment, the likelihood of the patient achieving the goals of the procedure, and any potential problems that might occur during the procedure or recuperation. Informed consent has been obtained.  Description of procedure:  The patient was taken to the operating room and general anesthesia was induced.  The patient was placed in the dorsal lithotomy position, prepped and draped in the usual sterile fashion, and preoperative antibiotics were administered. A preoperative time-out was performed.   At this point in time, a 16 French rigid cystoscope was advanced per urethra into the bladder. A careful cystoscopy was performed at this point at which time no ulcerations, mucosal lesions, or any other suspicious findings were identified. She did have a fairly large saccule on the right lateral border of the bladder which was mildly trabeculated. The  trigone appeared to be normal with clear reflux of urine from both UOs. There was some bladder descent noted consistent with a history of cystocele. Attention was introduced of the right ureteral orifice which was cannulated using a 5 [place] open-ended ureteral catheter. A retrograde pyelogram was performed by injecting contrast into the distal ureter. The retrograde revealed a very delicate normal appearing ureter and upper tract collecting system. There were no filling defects or hydronephrosis. These images were saved to the PACS imaging. Attention was then turned to the left ureteral orifice and the same procedure was performed. Again this revealed a normal caliber ureter without filling defects as well as a normal upper tract collecting system without hydronephrosis or defects. At this point time, the bladder was drained. The patient was then repositioned the supine position, reversed from anesthesia, and taken to the PACU in stable condition.  Hollice Espy, M.D.

## 2015-02-10 LAB — CYTOLOGY - NON PAP

## 2015-02-16 ENCOUNTER — Other Ambulatory Visit: Payer: Self-pay | Admitting: Oncology

## 2015-03-10 ENCOUNTER — Other Ambulatory Visit: Payer: Self-pay | Admitting: Hematology and Oncology

## 2015-03-15 ENCOUNTER — Other Ambulatory Visit: Payer: Self-pay | Admitting: Oncology

## 2015-03-16 ENCOUNTER — Other Ambulatory Visit: Payer: Self-pay | Admitting: Hematology and Oncology

## 2015-05-03 ENCOUNTER — Other Ambulatory Visit: Payer: Self-pay | Admitting: Hematology and Oncology

## 2015-05-03 ENCOUNTER — Other Ambulatory Visit: Payer: Self-pay

## 2015-05-03 DIAGNOSIS — C50911 Malignant neoplasm of unspecified site of right female breast: Secondary | ICD-10-CM

## 2015-05-03 DIAGNOSIS — C73 Malignant neoplasm of thyroid gland: Secondary | ICD-10-CM

## 2015-05-04 ENCOUNTER — Inpatient Hospital Stay: Payer: Medicare Other | Attending: Hematology and Oncology

## 2015-05-04 ENCOUNTER — Inpatient Hospital Stay (HOSPITAL_BASED_OUTPATIENT_CLINIC_OR_DEPARTMENT_OTHER): Payer: Medicare Other | Admitting: Hematology and Oncology

## 2015-05-04 VITALS — BP 154/64 | HR 54 | Temp 96.8°F | Wt 140.4 lb

## 2015-05-04 DIAGNOSIS — Z85828 Personal history of other malignant neoplasm of skin: Secondary | ICD-10-CM | POA: Insufficient documentation

## 2015-05-04 DIAGNOSIS — K222 Esophageal obstruction: Secondary | ICD-10-CM

## 2015-05-04 DIAGNOSIS — Z9223 Personal history of estrogen therapy: Secondary | ICD-10-CM | POA: Insufficient documentation

## 2015-05-04 DIAGNOSIS — C73 Malignant neoplasm of thyroid gland: Secondary | ICD-10-CM

## 2015-05-04 DIAGNOSIS — Z85850 Personal history of malignant neoplasm of thyroid: Secondary | ICD-10-CM | POA: Diagnosis not present

## 2015-05-04 DIAGNOSIS — M1990 Unspecified osteoarthritis, unspecified site: Secondary | ICD-10-CM | POA: Diagnosis not present

## 2015-05-04 DIAGNOSIS — Z170 Estrogen receptor positive status [ER+]: Secondary | ICD-10-CM | POA: Insufficient documentation

## 2015-05-04 DIAGNOSIS — J449 Chronic obstructive pulmonary disease, unspecified: Secondary | ICD-10-CM

## 2015-05-04 DIAGNOSIS — I10 Essential (primary) hypertension: Secondary | ICD-10-CM | POA: Diagnosis not present

## 2015-05-04 DIAGNOSIS — Z7982 Long term (current) use of aspirin: Secondary | ICD-10-CM | POA: Insufficient documentation

## 2015-05-04 DIAGNOSIS — Z79899 Other long term (current) drug therapy: Secondary | ICD-10-CM | POA: Insufficient documentation

## 2015-05-04 DIAGNOSIS — Z853 Personal history of malignant neoplasm of breast: Secondary | ICD-10-CM | POA: Diagnosis present

## 2015-05-04 DIAGNOSIS — Z923 Personal history of irradiation: Secondary | ICD-10-CM | POA: Diagnosis not present

## 2015-05-04 DIAGNOSIS — Z87891 Personal history of nicotine dependence: Secondary | ICD-10-CM | POA: Insufficient documentation

## 2015-05-04 DIAGNOSIS — M5136 Other intervertebral disc degeneration, lumbar region: Secondary | ICD-10-CM | POA: Insufficient documentation

## 2015-05-04 DIAGNOSIS — K219 Gastro-esophageal reflux disease without esophagitis: Secondary | ICD-10-CM

## 2015-05-04 DIAGNOSIS — M545 Low back pain: Secondary | ICD-10-CM | POA: Insufficient documentation

## 2015-05-04 DIAGNOSIS — M5430 Sciatica, unspecified side: Secondary | ICD-10-CM

## 2015-05-04 DIAGNOSIS — G8929 Other chronic pain: Secondary | ICD-10-CM | POA: Diagnosis not present

## 2015-05-04 DIAGNOSIS — E039 Hypothyroidism, unspecified: Secondary | ICD-10-CM | POA: Diagnosis not present

## 2015-05-04 DIAGNOSIS — R634 Abnormal weight loss: Secondary | ICD-10-CM | POA: Diagnosis not present

## 2015-05-04 DIAGNOSIS — C50911 Malignant neoplasm of unspecified site of right female breast: Secondary | ICD-10-CM

## 2015-05-04 DIAGNOSIS — C44301 Unspecified malignant neoplasm of skin of nose: Secondary | ICD-10-CM | POA: Insufficient documentation

## 2015-05-04 LAB — COMPREHENSIVE METABOLIC PANEL
ALT: 13 U/L — ABNORMAL LOW (ref 14–54)
AST: 19 U/L (ref 15–41)
Albumin: 4.2 g/dL (ref 3.5–5.0)
Alkaline Phosphatase: 98 U/L (ref 38–126)
Anion gap: 7 (ref 5–15)
BUN: 12 mg/dL (ref 6–20)
CO2: 35 mmol/L — ABNORMAL HIGH (ref 22–32)
Calcium: 9.2 mg/dL (ref 8.9–10.3)
Chloride: 96 mmol/L — ABNORMAL LOW (ref 101–111)
Creatinine, Ser: 0.72 mg/dL (ref 0.44–1.00)
GFR calc Af Amer: >60 mL/min (ref >60)
GFR calc non Af Amer: >60 mL/min (ref >60)
Glucose, Bld: 104 mg/dL — ABNORMAL HIGH (ref 65–99)
Potassium: 4.1 mmol/L (ref 3.5–5.1)
Sodium: 138 mmol/L (ref 135–145)
Total Bilirubin: 0.7 mg/dL (ref 0.3–1.2)
Total Protein: 7 g/dL (ref 6.5–8.1)

## 2015-05-04 LAB — CBC WITH DIFFERENTIAL/PLATELET
Basophils Absolute: 0 10*3/uL (ref 0–0.1)
Basophils Relative: 1 %
Eosinophils Absolute: 0.1 10*3/uL (ref 0–0.7)
Eosinophils Relative: 3 %
HCT: 45.5 % (ref 35.0–47.0)
Hemoglobin: 14.8 g/dL (ref 12.0–16.0)
Lymphocytes Relative: 18 %
Lymphs Abs: 1 10*3/uL (ref 1.0–3.6)
MCH: 27.3 pg (ref 26.0–34.0)
MCHC: 32.5 g/dL (ref 32.0–36.0)
MCV: 83.8 fL (ref 80.0–100.0)
Monocytes Absolute: 0.5 10*3/uL (ref 0.2–0.9)
Monocytes Relative: 10 %
Neutro Abs: 3.7 10*3/uL (ref 1.4–6.5)
Neutrophils Relative %: 68 %
Platelets: 211 10*3/uL (ref 150–440)
RBC: 5.44 MIL/uL — ABNORMAL HIGH (ref 3.80–5.20)
RDW: 14 % (ref 11.5–14.5)
WBC: 5.3 10*3/uL (ref 3.6–11.0)

## 2015-05-04 LAB — TSH: TSH: 0.543 u[IU]/mL (ref 0.350–4.500)

## 2015-05-04 NOTE — Progress Notes (Signed)
[facility] day:  05/04/2015  Chief Complaint: Rose Irwin is a 81 y.o. female with a history of thyroid carcinoma and breast cancer who is seen for 6 month assessment.  HPI: The patient was last seen in the medical oncology clinic on 11/03/2014.  At that time, she was seen for initial assessment by me.  Symptomatically, she was feeling well. Weight was stable.  She denied any pain.  Exam was unremarkable.  Mammogram on 10/12/2014 was negative.   Labs included a normal CBC with diff, CMP, TSH, and T4.  Thyroglobulin and thyroglobulin antibodies were inadvertantly not performed.  During the interim, he has lost 6 pounds. She notes a poor appetite. She notes that issues with low back pain (chronic).  She states that she is had back surgery. She is good degenerative disc disease.  She describes a stiff right leg.  She states that her right leg gives; she has sciatic nerve issues.    She notes a weekly spell of choking.  She states that the this has been dilated twice in the past. The last time her esophagus was dilated was about 15 years ago. Colonoscopy 3 years ago and does not plan to do anymore.  Regarding her history of breast cancer, she is no longer taking Arimidex.  She states that she doesn't want to try any other medications.  Past Medical History  Diagnosis Date  . Cancer     breast, thyroid  . Hypothyroidism   . Skin cancer   . Anxiety   . Hypertension   . Elevated lipids   . Arthritis   . GERD (gastroesophageal reflux disease)   . Osteopenia   . Hemorrhoids   . Depression   . Bronchitis   . COPD (chronic obstructive pulmonary disease)   . Complication of anesthesia     smothering  . Neuropathy     rt leg    Past Surgical History  Procedure Laterality Date  . Thyroid surgery    . Breast surgery    . Bladder polyps    . Abdominal hysterectomy    . Back surgery      lumbar  . Lung surgery    . Cystoscopy w/  retrogrades Bilateral 02/09/2015    Procedure: CYSTOSCOPY WITH RETROGRADE PYELOGRAM;  Surgeon: Hollice Espy, MD;  Location: [facility] ORS;  Service: Urology;  Laterality: Bilateral;    No family history on file.  Social History:  reports that she quit smoking about 10 months ago. She does not have any smokeless tobacco history on file. She reports that she does not drink alcohol or use illicit drugs.  She is smoking 2-3 cigarettes a day. The patient is alone today.  Allergies:  Allergies  Allergen Reactions  . Codeine Other (See Comments)    Go limp, sweat  . Sulfa Antibiotics Swelling    tongue  . Aspirin Palpitations    Current Medications: Current Outpatient Prescriptions  Medication Sig Dispense Refill  . albuterol-ipratropium (COMBIVENT) 18-103 MCG/ACT inhaler Inhale 1 puff into the lungs every 4 (four) hours as needed for wheezing or shortness of breath.    . ALPRAZolam (XANAX) 0.5 MG tablet TAKE ONE TABLET TWICE DAILY AS NEEDED FOR ANXIETY 60 tablet 0  . aspirin EC 81 MG tablet Take 81 mg by mouth daily.    Marland Kitchen losartan (COZAAR) 25 MG tablet Take 25 mg by mouth daily.    . metoprolol succinate (TOPROL-XL) 50 MG 24 hr tablet  Take 50 mg by mouth 2 (two) times daily. Take with or immediately following a meal.    . PARoxetine (PAXIL) 40 MG tablet Take 40 mg by mouth every morning.    . simvastatin (ZOCOR) 40 MG tablet Take 40 mg by mouth daily at 6 PM.    . SYNTHROID 100 MCG tablet TAKE ONE (1) TABLET BY MOUTH EVERY DAY 30 tablet 0   No current facility-administered medications for this visit.    Review of Systems:  GENERAL:  Feels "ok".  Active.  No fevers or sweats.  Weight loss of 6 pounds. PERFORMANCE STATUS (ECOG):  1 HEENT:  No visual changes, runny nose, sore throat, mouth sores or tenderness. Lungs: No shortness of breath or cough.  No hemoptysis. Cardiac:  No chest pain, palpitations, orthopnea, or PND. GI:  Poor appetite.  Weekly choking spell.  No nausea, vomiting,  diarrhea, constipation, melena or hematochezia. GU:  No urgency, frequency, dysuria, or hematuria. Musculoskeletal:   Chronic back pain (h/o back surgery; DDD).  No joint pain.  No muscle tenderness. Extremities:  No pain or swelling. Skin:  No rashes or skin changes. Neuro:  Sciatic nerve issues.  Right leg gives away.  No headache, numbness or weakness, balance or coordination issues. Endocrine:  No diabetes, thyroid issues, hot flashes or night sweats. Psych:  No mood changes, depression or anxiety. Pain:  No focal pain. Review of systems:  All other systems reviewed and found to be negative.  Physical Exam: Blood pressure 154/64, pulse 54, temperature 96.8 F (36 C), temperature source Tympanic, weight 140 lb 6.9 oz (63.7 kg), SpO2 95 %. GENERAL:  Well developed, well nourished, sitting comfortably in the exam room in no acute distress.  She has a cane at her side. MENTAL STATUS:  Alert and oriented to person, place and time. HEAD:  Short gray hair.  Normocephalic, atraumatic, face symmetric, no Cushingoid features. EYES:  Brown eyes.  Pupils equal round and reactive to light and accomodation.  No conjunctivitis or scleral icterus. ENT:  Oropharynx clear without lesion.  Tongue normal. Mucous membranes moist.  RESPIRATORY:  Clear to auscultation without rales, wheezes or rhonchi. CARDIOVASCULAR:  Regular rate and rhythm without murmur, rub or gallop. BREAST:  Right breast with with post-operative changes.  Moderate fibrocystic changes superiorly.  No discrete masses, skin changes or nipple discharge.  Left breast without masses, skin changes or nipple discharge.  Mild fibrocystic changes. ABDOMEN:  Soft, non-tender, with active bowel sounds, and no hepatosplenomegaly.  No masses. SKIN:  She has a history of skin cancer involving the nose 8 years ago.  She notes it being angry/swollen.  No rashes, ulcers or lesions. EXTREMITIES: No edema, no skin discoloration or tenderness.  No palpable  cords. LYMPH NODES: No palpable cervical, supraclavicular, axillary or inguinal adenopathy  NEUROLOGICAL: Unremarkable. PSYCH:  Appropriate.  Clinical Support on 05/04/2015  Component Date Value Ref Range Status  . WBC 05/04/2015 5.3  3.6 - 11.0 K/uL Final  . RBC 05/04/2015 5.44* 3.80 - 5.20 MIL/uL Final  . Hemoglobin 05/04/2015 14.8  12.0 - 16.0 g/dL Final  . HCT 05/04/2015 45.5  35.0 - 47.0 % Final  . MCV 05/04/2015 83.8  80.0 - 100.0 fL Final  . MCH 05/04/2015 27.3  26.0 - 34.0 pg Final  . MCHC 05/04/2015 32.5  32.0 - 36.0 g/dL Final  . RDW 05/04/2015 14.0  11.5 - 14.5 % Final  . Platelets 05/04/2015 211  150 - 440 K/uL Final  . Neutrophils  Relative % 05/04/2015 68   Final  . Neutro Abs 05/04/2015 3.7  1.4 - 6.5 K/uL Final  . Lymphocytes Relative 05/04/2015 18   Final  . Lymphs Abs 05/04/2015 1.0  1.0 - 3.6 K/uL Final  . Monocytes Relative 05/04/2015 10   Final  . Monocytes Absolute 05/04/2015 0.5  0.2 - 0.9 K/uL Final  . Eosinophils Relative 05/04/2015 3   Final  . Eosinophils Absolute 05/04/2015 0.1  0 - 0.7 K/uL Final  . Basophils Relative 05/04/2015 1   Final  . Basophils Absolute 05/04/2015 0.0  0 - 0.1 K/uL Final  . Sodium 05/04/2015 138  135 - 145 mmol/L Final  . Potassium 05/04/2015 4.1  3.5 - 5.1 mmol/L Final  . Chloride 05/04/2015 96* 101 - 111 mmol/L Final  . CO2 05/04/2015 35* 22 - 32 mmol/L Final  . Glucose, Bld 05/04/2015 104* 65 - 99 mg/dL Final  . BUN 05/04/2015 12  6 - 20 mg/dL Final  . Creatinine, Ser 05/04/2015 0.72  0.44 - 1.00 mg/dL Final  . Calcium 05/04/2015 9.2  8.9 - 10.3 mg/dL Final  . Total Protein 05/04/2015 7.0  6.5 - 8.1 g/dL Final  . Albumin 05/04/2015 4.2  3.5 - 5.0 g/dL Final  . AST 05/04/2015 19  15 - 41 U/L Final  . ALT 05/04/2015 13* 14 - 54 U/L Final  . Alkaline Phosphatase 05/04/2015 98  38 - 126 U/L Final  . Total Bilirubin 05/04/2015 0.7  0.3 - 1.2 mg/dL Final  . GFR calc non Af Amer 05/04/2015 >60  >60 mL/min Final  . GFR calc Af  Amer 05/04/2015 >60  >60 mL/min Final   Comment: (NOTE) The eGFR has been calculated using the CKD EPI equation. This calculation has not been validated in all clinical situations. eGFR's persistently <60 mL/min signify possible Chronic Kidney Disease.   . Anion gap 05/04/2015 7  5 - 15 Final  . CA 27.29 05/04/2015 35.6  0.0 - 38.6 U/mL Final   Comment: (NOTE) [employer] [facility]/ACS methodology Performed At: [facility] [place], [place] [id number] Lindon Romp MD [id number]   . TSH 05/04/2015 0.543  0.350 - 4.500 uIU/mL Final  . T4, Total 05/04/2015 10.0  4.5 - 12.0 ug/dL Final   Comment: (NOTE) Performed At: [facility] [place], [place] [id number] Lindon Romp MD [id number]   . Thyroglobulin Antibody 05/04/2015 <1.0  0.0 - 0.9 IU/mL Final   Comment: (NOTE) Thyroglobulin Antibody measured by [facility] Methodology Performed At: [facility] [place], [place] [id number] Lindon Romp MD [id number]   . Thyroglobulin by IMA 05/04/2015 34.0  1.5 - 38.5 ng/mL Final   Comment: (NOTE) According to the [facility] of Clinical Biochemistry, the reference interval for Thyroglobulin (TG) should be related to euthyroid patients and not for patients who underwent thyroidectomy. TG reference intervals for these patients depend on the residual mass of the thyroid tissue left after surgery. Establishing a post-operative baseline is recommended. The assay limit of quantitation is 0.1 ng/mL Thyroglobulin measured by [facility] Immunometric Assay Performed At: [facility] [place], [place] [id number] Lindon Romp MD [id number]     Assessment:  Rose Irwin is a 81 y.o. female with a history of stage IB right breast cancer (2013) and thyroid cancer (1999).  She presented with neck fullness.  She underwent thyrodectomy and I-131.  She has a substernal  goiter.  She was diagnosed with  with right breast cancer in 10/2011.  Pathology revealed a T1bN0M0 tumor which was ER positive, PR posiitive and her2/neu negative.  She underwent lumpectomy followed by radiation.  She began Arimidex after radiation.  She discontinued Arimidex this year.  Mammogram on 10/12/2014 was negative.  CA27.29 was 35.6 on 05/04/2015.  She has a history of esophageal dilatation x 2 (last 15 years ago).  She describes weekly choking episodes.  Symptomatically, notes issues with her right leg caused by sciatica.  Exam is stable.  Plan: 1. Labs today:  CBC with diff, CMP, CA27.29, TSH, T4, thyroglobulin, and anti-thyroglobulin antibodies. 2. Schedule mammogram 10/12/2015. 3. Schedule follow-up with Dr. Vira Agar (GI) for EGD. 4. Schedule appointment with dermatology. 5. RTC in 6 months for MD assessment and labs (CBC with diff, CMP, CA27.29, T4, TSH, thyroglobulin + antibodies)   Lequita Asal, MD  05/04/2015, 11:02 AM

## 2015-05-04 NOTE — Progress Notes (Signed)
Pt here for routine Follow up of R Breast Ca and Hx Thyroid cancer in 1999. She reports occ tenderness/burning/itching in axillary area where lymph node was removed. She denies hoarseness or throat pain. She does have to eat very slowly and chew her foods well because food tends to lodge in her esophagus. She avoids steak. She states she has a low appetite. Eats 1 meal a day on some days. She has had a gradual weight loss of about 6 lbs. She has chronic back pain and nerve damage in her R leg. She uses a cane because R leg gets stiff and sore. [facility] urology for bladder incontinence. On Monday she noticed tenderness and swelling in the corner of her L nare. She is concerned because she had a cancer removed 8 years ago at that same spot. Dr Kellie Moor was her dermatolologist but is no longer in her insurance network.

## 2015-05-05 LAB — CANCER ANTIGEN 27.29: CA 27.29: 35.6 U/mL (ref 0.0–38.6)

## 2015-05-05 LAB — THYROGLOBULIN BY IMA: Thyroglobulin by IMA: 34 ng/mL (ref 1.5–38.5)

## 2015-05-05 LAB — T4: T4, Total: 10 ug/dL (ref 4.5–12.0)

## 2015-05-05 LAB — TGAB+THYROGLOBULIN IMA OR RIA: Thyroglobulin Antibody: <1 IU/mL (ref 0.0–0.9)

## 2015-09-05 ENCOUNTER — Encounter: Payer: Self-pay | Admitting: Hematology and Oncology

## 2015-11-02 ENCOUNTER — Other Ambulatory Visit: Payer: Self-pay

## 2015-11-02 ENCOUNTER — Inpatient Hospital Stay: Payer: Medicare Other | Attending: Hematology and Oncology

## 2015-11-02 ENCOUNTER — Inpatient Hospital Stay (HOSPITAL_BASED_OUTPATIENT_CLINIC_OR_DEPARTMENT_OTHER): Payer: Medicare Other | Admitting: Hematology and Oncology

## 2015-11-02 VITALS — BP 164/70 | HR 72 | Temp 96.0°F | Resp 18 | Wt 139.1 lb

## 2015-11-02 DIAGNOSIS — E039 Hypothyroidism, unspecified: Secondary | ICD-10-CM | POA: Insufficient documentation

## 2015-11-02 DIAGNOSIS — Z85850 Personal history of malignant neoplasm of thyroid: Secondary | ICD-10-CM | POA: Diagnosis not present

## 2015-11-02 DIAGNOSIS — Z9223 Personal history of estrogen therapy: Secondary | ICD-10-CM

## 2015-11-02 DIAGNOSIS — C73 Malignant neoplasm of thyroid gland: Secondary | ICD-10-CM

## 2015-11-02 DIAGNOSIS — Z7982 Long term (current) use of aspirin: Secondary | ICD-10-CM | POA: Insufficient documentation

## 2015-11-02 DIAGNOSIS — E048 Other specified nontoxic goiter: Secondary | ICD-10-CM | POA: Insufficient documentation

## 2015-11-02 DIAGNOSIS — I10 Essential (primary) hypertension: Secondary | ICD-10-CM | POA: Diagnosis not present

## 2015-11-02 DIAGNOSIS — Z79899 Other long term (current) drug therapy: Secondary | ICD-10-CM | POA: Diagnosis not present

## 2015-11-02 DIAGNOSIS — C50911 Malignant neoplasm of unspecified site of right female breast: Secondary | ICD-10-CM

## 2015-11-02 DIAGNOSIS — Z87891 Personal history of nicotine dependence: Secondary | ICD-10-CM | POA: Diagnosis not present

## 2015-11-02 DIAGNOSIS — Z853 Personal history of malignant neoplasm of breast: Secondary | ICD-10-CM | POA: Insufficient documentation

## 2015-11-02 DIAGNOSIS — Z85828 Personal history of other malignant neoplasm of skin: Secondary | ICD-10-CM | POA: Insufficient documentation

## 2015-11-02 DIAGNOSIS — R1310 Dysphagia, unspecified: Secondary | ICD-10-CM | POA: Insufficient documentation

## 2015-11-02 DIAGNOSIS — Z170 Estrogen receptor positive status [ER+]: Secondary | ICD-10-CM | POA: Insufficient documentation

## 2015-11-02 DIAGNOSIS — M1990 Unspecified osteoarthritis, unspecified site: Secondary | ICD-10-CM | POA: Diagnosis not present

## 2015-11-02 DIAGNOSIS — J449 Chronic obstructive pulmonary disease, unspecified: Secondary | ICD-10-CM | POA: Diagnosis not present

## 2015-11-02 DIAGNOSIS — K219 Gastro-esophageal reflux disease without esophagitis: Secondary | ICD-10-CM | POA: Diagnosis not present

## 2015-11-02 DIAGNOSIS — M8580 Other specified disorders of bone density and structure, unspecified site: Secondary | ICD-10-CM

## 2015-11-02 DIAGNOSIS — G629 Polyneuropathy, unspecified: Secondary | ICD-10-CM | POA: Diagnosis not present

## 2015-11-02 DIAGNOSIS — F419 Anxiety disorder, unspecified: Secondary | ICD-10-CM | POA: Diagnosis not present

## 2015-11-02 DIAGNOSIS — Z923 Personal history of irradiation: Secondary | ICD-10-CM | POA: Diagnosis not present

## 2015-11-02 DIAGNOSIS — K222 Esophageal obstruction: Secondary | ICD-10-CM

## 2015-11-02 LAB — COMPREHENSIVE METABOLIC PANEL
ALT: 15 U/L (ref 14–54)
AST: 21 U/L (ref 15–41)
Albumin: 4 g/dL (ref 3.5–5.0)
Alkaline Phosphatase: 86 U/L (ref 38–126)
Anion gap: 7 (ref 5–15)
BUN: 13 mg/dL (ref 6–20)
CO2: 33 mmol/L — ABNORMAL HIGH (ref 22–32)
Calcium: 9.2 mg/dL (ref 8.9–10.3)
Chloride: 96 mmol/L — ABNORMAL LOW (ref 101–111)
Creatinine, Ser: 0.66 mg/dL (ref 0.44–1.00)
GFR calc Af Amer: >60 mL/min (ref >60)
GFR calc non Af Amer: >60 mL/min (ref >60)
Glucose, Bld: 99 mg/dL (ref 65–99)
Potassium: 3.7 mmol/L (ref 3.5–5.1)
Sodium: 136 mmol/L (ref 135–145)
Total Bilirubin: 1 mg/dL (ref 0.3–1.2)
Total Protein: 6.7 g/dL (ref 6.5–8.1)

## 2015-11-02 LAB — CBC WITH DIFFERENTIAL/PLATELET
Basophils Absolute: 0 10*3/uL (ref 0–0.1)
Basophils Relative: 1 %
Eosinophils Absolute: 0.1 10*3/uL (ref 0–0.7)
Eosinophils Relative: 2 %
HCT: 44.2 % (ref 35.0–47.0)
Hemoglobin: 14.8 g/dL (ref 12.0–16.0)
Lymphocytes Relative: 21 %
Lymphs Abs: 1.1 10*3/uL (ref 1.0–3.6)
MCH: 27.7 pg (ref 26.0–34.0)
MCHC: 33.4 g/dL (ref 32.0–36.0)
MCV: 82.8 fL (ref 80.0–100.0)
Monocytes Absolute: 0.6 10*3/uL (ref 0.2–0.9)
Monocytes Relative: 11 %
Neutro Abs: 3.4 10*3/uL (ref 1.4–6.5)
Neutrophils Relative %: 65 %
Platelets: 186 10*3/uL (ref 150–440)
RBC: 5.33 MIL/uL — ABNORMAL HIGH (ref 3.80–5.20)
RDW: 13.6 % (ref 11.5–14.5)
WBC: 5.2 10*3/uL (ref 3.6–11.0)

## 2015-11-02 LAB — TSH: TSH: 0.241 u[IU]/mL — ABNORMAL LOW (ref 0.350–4.500)

## 2015-11-02 NOTE — Progress Notes (Signed)
[facility] day:  11/02/2015   Chief Complaint: Rose Irwin is a 82 y.o. female with a history of thyroid carcinoma and breast cancer who is seen for 6 month assessment.  HPI: The patient was last seen in the medical oncology clinic on 05/04/2015.  At that time, she was no longer taking Arimidex and wasn't interested in trying other hormonal therapy. Exam was stable.  CA27.29 was 35.6 (normal).  At last visit, she described choking episodes.  She had a history of esophageal dilatations.  It was recommended that she follow-up with gastroenterology.  It was also recommended that she follow-up with dermatology regarding a non-healing nasal lesion.  The patient was scheduled for follow-up mammogram on 10/12/2015.   The patient did not have her mammogram.  During the interim, she notes ongoing swallowing issues.  She feels that food is "pausing" in her esophagus.  She has to be careful and chew her food well.  She also notes ongoing back issues and numbness down her right leg.  She is being seen by orthopedics.  She denies any breast concerns.  Review of prior imaging notes the following:  Chest CT from 08/01/2011 revealed a 3.8 x 3.9 x 5.7 cm mass in the superior mediastinum lying posterior to the trachea and displacing the esophagus toward the right along its upper portion. It may have involved the esophageal wall. This was felt likely to reflect the known thyroid malignancy. More distally the thoracic esophagus appears normal. Decision was made for observation as the mass was stable.   Past Medical History  Diagnosis Date  . Cancer [facility])     breast, thyroid  . Hypothyroidism   . Skin cancer   . Anxiety   . Hypertension   . Elevated lipids   . Arthritis   . GERD (gastroesophageal reflux disease)   . Osteopenia   . Hemorrhoids   . Depression   . Bronchitis   . COPD (chronic obstructive pulmonary disease) ([place])   . Complication of  anesthesia     smothering  . Neuropathy ([place])     rt leg    Past Surgical History  Procedure Laterality Date  . Thyroid surgery    . Breast surgery    . Bladder polyps    . Abdominal hysterectomy    . Back surgery      lumbar  . Lung surgery    . Cystoscopy w/ retrogrades Bilateral 02/09/2015    Procedure: CYSTOSCOPY WITH RETROGRADE PYELOGRAM;  Surgeon: Hollice Espy, MD;  Location: [facility] ORS;  Service: Urology;  Laterality: Bilateral;    No family history on file.  Social History:  reports that she quit smoking about a year ago. She does not have any smokeless tobacco history on file. She reports that she does not drink alcohol or use illicit drugs.  She is smoking 2-3 cigarettes a day. The patient is alone today.  Allergies:  Allergies  Allergen Reactions  . Codeine Other (See Comments)    Go limp, sweat  . Sulfa Antibiotics Swelling    tongue  . Aspirin Palpitations    Current Medications: Current Outpatient Prescriptions  Medication Sig Dispense Refill  . albuterol-ipratropium (COMBIVENT) 18-103 MCG/ACT inhaler Inhale 1 puff into the lungs every 4 (four) hours as needed for wheezing or shortness of breath.    . ALPRAZolam (XANAX) 0.5 MG tablet TAKE ONE TABLET TWICE DAILY AS NEEDED FOR ANXIETY 60 tablet 0  . aspirin EC 81  MG tablet Take 81 mg by mouth daily.    . Fluticasone-Salmeterol (ADVAIR DISKUS) 250-50 MCG/DOSE AEPB Inhale 1 puff into the lungs every 12 (twelve) hours as needed.    Marland Kitchen losartan (COZAAR) 25 MG tablet Take 25 mg by mouth daily.    . metoprolol succinate (TOPROL-XL) 50 MG 24 hr tablet Take 50 mg by mouth 2 (two) times daily. Take with or immediately following a meal.    . PARoxetine (PAXIL) 20 MG tablet Take 20 mg by mouth daily.    . simvastatin (ZOCOR) 40 MG tablet Take 40 mg by mouth daily at 6 PM.    . SYNTHROID 100 MCG tablet TAKE ONE (1) TABLET BY MOUTH EVERY DAY 30 tablet 0   No current facility-administered medications for this visit.     Review of Systems:  GENERAL:  Feels "ok".  Active.  No fevers or sweats.  Weight loss of 1 pound. PERFORMANCE STATUS (ECOG):  1 HEENT:  No visual changes, runny nose, sore throat, mouth sores or tenderness. Lungs: No shortness of breath or cough.  No hemoptysis. Cardiac:  No chest pain, palpitations, orthopnea, or PND. GI:  Poor appetite.  Weekly choking spells.  No nausea, vomiting, diarrhea, constipation, melena or hematochezia. GU:  No urgency, frequency, dysuria, or hematuria. Musculoskeletal:   Chronic back pain (h/o back surgery; DDD) seeing orthopedics.  No joint pain.  No muscle tenderness. Extremities:  No pain or swelling. Skin:  No rashes or skin changes. Neuro:  Sciatic nerve issues (shooting pain down right leg).  Right leg gives away.  No headache, numbness or weakness, balance or coordination issues. Endocrine:  No diabetes.  h/o thyroid cancer.  No hot flashes or night sweats. Psych:  No mood changes, depression or anxiety. Pain:  No focal pain. Review of systems:  All other systems reviewed and found to be negative.  Physical Exam: Blood pressure 164/70, pulse 72, temperature 96 F (35.6 C), temperature source Tympanic, resp. rate 18, weight 139 lb 1.8 oz (63.1 kg). GENERAL:  Well developed, well nourished, sitting comfortably in the exam room in no acute distress.  She has a cane at her side. MENTAL STATUS:  Alert and oriented to person, place and time. HEAD:  Short gray hair.  Normocephalic, atraumatic, face symmetric, no Cushingoid features. EYES:  Brown eyes.  Pupils equal round and reactive to light and accomodation.  No conjunctivitis or scleral icterus. ENT:  Oropharynx clear without lesion.  Tongue normal. Mucous membranes moist.  RESPIRATORY:  Clear to auscultation without rales, wheezes or rhonchi. CARDIOVASCULAR:  Regular rate and rhythm without murmur, rub or gallop. BREAST:  Right breast with with post-operative changes (curved incision).  Moderate  fibrocystic changes superiorly.  No discrete masses, skin changes or nipple discharge.  Left breast without masses, skin changes or nipple discharge.  Mild fibrocystic changes. ABDOMEN:  Soft, non-tender, with active bowel sounds, and no hepatosplenomegaly.  No masses. SKIN:  No rashes, ulcers or lesions. EXTREMITIES: No edema, no skin discoloration or tenderness.  No palpable cords. LYMPH NODES: No palpable cervical, supraclavicular, axillary or inguinal adenopathy  NEUROLOGICAL: Unremarkable. PSYCH:  Appropriate.  Appointment on 11/02/2015  Component Date Value Ref Range Status  . Sodium 11/02/2015 136  135 - 145 mmol/L Final  . Potassium 11/02/2015 3.7  3.5 - 5.1 mmol/L Final  . Chloride 11/02/2015 96* 101 - 111 mmol/L Final  . CO2 11/02/2015 33* 22 - 32 mmol/L Final  . Glucose, Bld 11/02/2015 99  65 - 99 mg/dL  Final  . BUN 11/02/2015 13  6 - 20 mg/dL Final  . Creatinine, Ser 11/02/2015 0.66  0.44 - 1.00 mg/dL Final  . Calcium 11/02/2015 9.2  8.9 - 10.3 mg/dL Final  . Total Protein 11/02/2015 6.7  6.5 - 8.1 g/dL Final  . Albumin 11/02/2015 4.0  3.5 - 5.0 g/dL Final  . AST 11/02/2015 21  15 - 41 U/L Final  . ALT 11/02/2015 15  14 - 54 U/L Final  . Alkaline Phosphatase 11/02/2015 86  38 - 126 U/L Final  . Total Bilirubin 11/02/2015 1.0  0.3 - 1.2 mg/dL Final  . GFR calc non Af Amer 11/02/2015 >60  >60 mL/min Final  . GFR calc Af Amer 11/02/2015 >60  >60 mL/min Final   Comment: (NOTE) The eGFR has been calculated using the CKD EPI equation. This calculation has not been validated in all clinical situations. eGFR's persistently <60 mL/min signify possible Chronic Kidney Disease.   . Anion gap 11/02/2015 7  5 - 15 Final  . WBC 11/02/2015 5.2  3.6 - 11.0 K/uL Final  . RBC 11/02/2015 5.33* 3.80 - 5.20 MIL/uL Final  . Hemoglobin 11/02/2015 14.8  12.0 - 16.0 g/dL Final  . HCT 11/02/2015 44.2  35.0 - 47.0 % Final  . MCV 11/02/2015 82.8  80.0 - 100.0 fL Final  . MCH 11/02/2015 27.7   26.0 - 34.0 pg Final  . MCHC 11/02/2015 33.4  32.0 - 36.0 g/dL Final  . RDW 11/02/2015 13.6  11.5 - 14.5 % Final  . Platelets 11/02/2015 186  150 - 440 K/uL Final  . Neutrophils Relative % 11/02/2015 65   Final  . Neutro Abs 11/02/2015 3.4  1.4 - 6.5 K/uL Final  . Lymphocytes Relative 11/02/2015 21   Final  . Lymphs Abs 11/02/2015 1.1  1.0 - 3.6 K/uL Final  . Monocytes Relative 11/02/2015 11   Final  . Monocytes Absolute 11/02/2015 0.6  0.2 - 0.9 K/uL Final  . Eosinophils Relative 11/02/2015 2   Final  . Eosinophils Absolute 11/02/2015 0.1  0 - 0.7 K/uL Final  . Basophils Relative 11/02/2015 1   Final  . Basophils Absolute 11/02/2015 0.0  0 - 0.1 K/uL Final    Assessment:  Rose Irwin is a 82 y.o. female with a history of stage IB right breast cancer (2013) and thyroid cancer (1999).  She presented with neck fullness.  She underwent thyrodectomy and I-131.  She has a substernal goiter.  Chest CT on 08/01/2011 revealed a 3.8 x 3.9 x 5.7 cm mass in the superior mediastinum lying posterior to the trachea and displacing the esophagus toward the right along its upper portion.  More distally the thoracic esophagus appears normal. Decision was made for observation  She was diagnosed with with right breast cancer in 10/2011.  Pathology revealed a T1bN0M0 tumor which was ER positive, PR posiitive and her2/neu negative.  She underwent lumpectomy followed by radiation.  She began Arimidex after radiation.  She discontinued Arimidex this year.    Mammogram on 10/12/2014 was negative.  CA27.29 was 35.6 on 05/04/2015.  She has a history of esophageal dilatation x 2 (last 15 years ago).  She describes weekly choking episodes.  Symptomatically, notes issues with her right leg caused by sciatica.  Exam is stable.  Plan: 1. Labs today:  CBC with diff, CMP, CA27.29, TSH, T4, thyroglobulin, and anti-thyroglobulin antibodies. 2. Reschedule mammogram. 3. Schedule appointment with Dr. Vira Agar (GI) for  EGD.  She is an established  patient. 4. Schedule chest CT without contrast:  assess thyroid mass previously displacing esophagus; recurrent swallowing issues. 5. RTC after CT scan for MD assessment and discussion regarding direction of therapy.   Lequita Asal, MD  11/02/2015, 11:25 AM

## 2015-11-02 NOTE — Progress Notes (Signed)
States has been having back issues that are unrelated to cancer diagnosis but states has nerve pain in both legs.

## 2015-11-03 ENCOUNTER — Other Ambulatory Visit: Payer: Self-pay

## 2015-11-03 DIAGNOSIS — C73 Malignant neoplasm of thyroid gland: Secondary | ICD-10-CM

## 2015-11-03 LAB — T4, FREE: Free T4: 1.23 ng/dL — ABNORMAL HIGH (ref 0.61–1.12)

## 2015-11-06 ENCOUNTER — Encounter: Payer: Self-pay | Admitting: Hematology and Oncology

## 2015-11-06 LAB — CANCER ANTIGEN 27.29: CA 27.29: 33.4 U/mL (ref 0.0–38.6)

## 2015-11-06 LAB — THYROGLOBULIN ANTIBODY: Thyroglobulin Antibody: <1 IU/mL (ref 0.0–0.9)

## 2015-11-06 LAB — T4: T4, Total: 10.7 ug/dL (ref 4.5–12.0)

## 2015-11-06 LAB — THYROGLOBULIN LEVEL: Thyroglobulin: 33 ng/mL

## 2015-11-09 ENCOUNTER — Ambulatory Visit
Admission: RE | Admit: 2015-11-09 | Discharge: 2015-11-09 | Disposition: A | Discharge status: Home / Self Care | Payer: Medicare Other | Source: Ambulatory Visit | Attending: Hematology and Oncology | Admitting: Hematology and Oncology

## 2015-11-09 DIAGNOSIS — I272 Other secondary pulmonary hypertension: Secondary | ICD-10-CM | POA: Insufficient documentation

## 2015-11-09 DIAGNOSIS — C73 Malignant neoplasm of thyroid gland: Secondary | ICD-10-CM | POA: Diagnosis present

## 2015-11-15 ENCOUNTER — Other Ambulatory Visit: Payer: Self-pay | Admitting: Physician Assistant

## 2015-11-15 DIAGNOSIS — R1319 Other dysphagia: Secondary | ICD-10-CM

## 2015-11-17 ENCOUNTER — Other Ambulatory Visit: Payer: Self-pay | Admitting: Hematology and Oncology

## 2015-11-17 ENCOUNTER — Ambulatory Visit
Admission: RE | Admit: 2015-11-17 | Discharge: 2015-11-17 | Disposition: A | Discharge status: Home / Self Care | Payer: Medicare Other | Source: Ambulatory Visit | Attending: Hematology and Oncology | Admitting: Hematology and Oncology

## 2015-11-17 DIAGNOSIS — C50911 Malignant neoplasm of unspecified site of right female breast: Secondary | ICD-10-CM

## 2015-11-17 DIAGNOSIS — Z9889 Other specified postprocedural states: Secondary | ICD-10-CM | POA: Insufficient documentation

## 2015-11-17 HISTORY — DX: Malignant neoplasm of unspecified site of unspecified female breast: C50.919

## 2015-11-21 ENCOUNTER — Ambulatory Visit
Admission: RE | Admit: 2015-11-21 | Discharge: 2015-11-21 | Disposition: A | Discharge status: Home / Self Care | Payer: Medicare Other | Source: Ambulatory Visit | Attending: Physician Assistant | Admitting: Physician Assistant

## 2015-11-21 DIAGNOSIS — R1319 Other dysphagia: Secondary | ICD-10-CM | POA: Diagnosis not present

## 2015-11-23 ENCOUNTER — Encounter: Payer: Self-pay | Admitting: Hematology and Oncology

## 2015-11-23 ENCOUNTER — Inpatient Hospital Stay: Payer: Medicare Other | Attending: Hematology and Oncology | Admitting: Hematology and Oncology

## 2015-11-23 VITALS — BP 167/64 | HR 59 | Temp 97.0°F | Resp 18 | Ht 65.5 in | Wt 139.8 lb

## 2015-11-23 DIAGNOSIS — M549 Dorsalgia, unspecified: Secondary | ICD-10-CM | POA: Diagnosis not present

## 2015-11-23 DIAGNOSIS — Z85828 Personal history of other malignant neoplasm of skin: Secondary | ICD-10-CM | POA: Insufficient documentation

## 2015-11-23 DIAGNOSIS — Z170 Estrogen receptor positive status [ER+]: Secondary | ICD-10-CM | POA: Diagnosis not present

## 2015-11-23 DIAGNOSIS — K219 Gastro-esophageal reflux disease without esophagitis: Secondary | ICD-10-CM | POA: Diagnosis not present

## 2015-11-23 DIAGNOSIS — E049 Nontoxic goiter, unspecified: Secondary | ICD-10-CM | POA: Diagnosis not present

## 2015-11-23 DIAGNOSIS — Z853 Personal history of malignant neoplasm of breast: Secondary | ICD-10-CM | POA: Diagnosis not present

## 2015-11-23 DIAGNOSIS — M8580 Other specified disorders of bone density and structure, unspecified site: Secondary | ICD-10-CM | POA: Diagnosis not present

## 2015-11-23 DIAGNOSIS — E039 Hypothyroidism, unspecified: Secondary | ICD-10-CM | POA: Diagnosis not present

## 2015-11-23 DIAGNOSIS — F17210 Nicotine dependence, cigarettes, uncomplicated: Secondary | ICD-10-CM

## 2015-11-23 DIAGNOSIS — G8929 Other chronic pain: Secondary | ICD-10-CM

## 2015-11-23 DIAGNOSIS — M1990 Unspecified osteoarthritis, unspecified site: Secondary | ICD-10-CM | POA: Diagnosis not present

## 2015-11-23 DIAGNOSIS — Z7982 Long term (current) use of aspirin: Secondary | ICD-10-CM | POA: Insufficient documentation

## 2015-11-23 DIAGNOSIS — I272 Other secondary pulmonary hypertension: Secondary | ICD-10-CM | POA: Diagnosis not present

## 2015-11-23 DIAGNOSIS — Z923 Personal history of irradiation: Secondary | ICD-10-CM | POA: Diagnosis not present

## 2015-11-23 DIAGNOSIS — Z85850 Personal history of malignant neoplasm of thyroid: Secondary | ICD-10-CM

## 2015-11-23 DIAGNOSIS — K228 Other specified diseases of esophagus: Secondary | ICD-10-CM | POA: Diagnosis not present

## 2015-11-23 DIAGNOSIS — C73 Malignant neoplasm of thyroid gland: Secondary | ICD-10-CM

## 2015-11-23 DIAGNOSIS — Z9223 Personal history of estrogen therapy: Secondary | ICD-10-CM

## 2015-11-23 DIAGNOSIS — Z79899 Other long term (current) drug therapy: Secondary | ICD-10-CM | POA: Insufficient documentation

## 2015-11-23 DIAGNOSIS — J449 Chronic obstructive pulmonary disease, unspecified: Secondary | ICD-10-CM | POA: Diagnosis not present

## 2015-11-23 DIAGNOSIS — C50911 Malignant neoplasm of unspecified site of right female breast: Secondary | ICD-10-CM

## 2015-11-23 DIAGNOSIS — I10 Essential (primary) hypertension: Secondary | ICD-10-CM | POA: Diagnosis not present

## 2015-11-23 DIAGNOSIS — R1319 Other dysphagia: Secondary | ICD-10-CM

## 2015-11-23 NOTE — Progress Notes (Signed)
Pt reports right leg pain and that's nothing new.

## 2015-11-23 NOTE — Progress Notes (Signed)
[facility] day:  11/23/2015   Chief Complaint: Rose Irwin is a 82 y.o. female with a history of thyroid carcinoma and breast cancer who is seen for review of interval studies and discussion regarding direction of therapy.  HPI: The patient was last seen in the medical oncology clinic on 11/02/2015.  At that time, she was seen for 6 month assessment.  She noted ongoing swallowing studies.  She denied any breast concerns.  Review of prior imaging noted the following:  Chest CT from 08/01/2011 revealed a 3.8 x 3.9 x 5.7 cm mass in the superior mediastinum lying posterior to the trachea and displacing the esophagus toward the right along its upper portion. It may have involved the esophageal wall. This was felt likely to reflect the known thyroid malignancy. More distally the thoracic esophagus appeared normal. Decision was made for observation as the mass was stable.  Labs on 11/02/2015 revealed a normal CBC with diff and CMP.  CA27.29 was 33.4 (normal).  TSH was 0.241 (.35-4.5) with a free T4 of 1.23 (0.61-1.12). Thyroglobulin was 33 with anti-thyroglobulin of < 1.0 (low).  Patient underwent follow-up chest CT on 11/09/2015.  Imaging revealed a 3.7 x 4.0 mediastinal mass posterior to the mid trachea which was stable in size from 08/01/2011.  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 It exerted significant mass effect on the upper esophagus and was likely the cause for the patient's given symptoms.  There were enlarged pulmonary arteries, indicative of pulmonary arterial hypertension.  Mammogram on 11/17/2015 revealed no evidence of malignancy.  She was seen by Rose Leach, PA from Dr. Percell Boston office in gastroenterology on 11/22/2015.  Barium swallow on 11/21/2015 revealed deviation of the cervical esophagus to the right from previously identified paratracheal mass lesion.  There were no motility issues with her  esophagus.  Symptomatically, she continues to have swallowing issues.  She is feels that she is getting strangled when she eats and drinks.    Past Medical History  Diagnosis Date  . Cancer [facility]     breast, thyroid  . Hypothyroidism   . Skin cancer   . Anxiety   . Hypertension   . Elevated lipids   . Arthritis   . GERD (gastroesophageal reflux disease)   . Osteopenia   . Hemorrhoids   . Depression   . Bronchitis   . COPD (chronic obstructive pulmonary disease) ([place])   . Complication of anesthesia     smothering  . Neuropathy ([facility])     rt leg  . Breast cancer ([place]) 11/21/2011    right breast cancer - radiation    Past Surgical History  Procedure Laterality Date  . Thyroid surgery    . Breast surgery Right   . Bladder polyps    . Abdominal hysterectomy    . Back surgery      lumbar  . Lung surgery    . Cystoscopy w/ retrogrades Bilateral 02/09/2015    Procedure: CYSTOSCOPY WITH RETROGRADE PYELOGRAM;  Surgeon: Hollice Espy, MD;  Location: [facility] ORS;  Service: Urology;  Laterality: Bilateral;    Family History  Problem Relation Age of Onset  . Breast cancer Mother   . Breast cancer Sister   . Breast cancer Sister     Social History:  reports that she quit smoking about 13 months ago. She does not have any smokeless tobacco history on file. She reports that she does not drink alcohol or  use illicit drugs.  She is smoking 1/4 pack a day. The patient is accompanied by her sister today.  Allergies:  Allergies  Allergen Reactions  . Codeine Other (See Comments)    Go limp, sweat  . Sulfa Antibiotics Swelling    tongue  . Aspirin Palpitations    Current Medications: Current Outpatient Prescriptions  Medication Sig Dispense Refill  . albuterol-ipratropium (COMBIVENT) 18-103 MCG/ACT inhaler Inhale 1 puff into the lungs every 4 (four) hours as needed for wheezing or shortness of breath.    . ALPRAZolam (XANAX) 0.5 MG tablet TAKE ONE TABLET TWICE DAILY AS NEEDED FOR  ANXIETY 60 tablet 0  . aspirin EC 81 MG tablet Take 81 mg by mouth daily.    . Fluticasone-Salmeterol (ADVAIR DISKUS) 250-50 MCG/DOSE AEPB Inhale 1 puff into the lungs every 12 (twelve) hours as needed.    Marland Kitchen losartan (COZAAR) 25 MG tablet Take 25 mg by mouth daily.    . metoprolol succinate (TOPROL-XL) 50 MG 24 hr tablet Take 50 mg by mouth 2 (two) times daily. Take with or immediately following a meal.    . PARoxetine (PAXIL) 20 MG tablet Take 20 mg by mouth daily.    . simvastatin (ZOCOR) 40 MG tablet Take 40 mg by mouth daily at 6 PM.    . SYNTHROID 100 MCG tablet TAKE ONE (1) TABLET BY MOUTH EVERY DAY 30 tablet 0   No current facility-administered medications for this visit.    Review of Systems:  GENERAL:  Feels "ok".  Active.  No fevers or sweats.  Weight stable. PERFORMANCE STATUS (ECOG):  1 HEENT:  No visual changes, runny nose, sore throat, mouth sores or tenderness. Lungs: No shortness of breath or cough.  No hemoptysis. Cardiac:  No chest pain, palpitations, orthopnea, or PND. GI:  Poor appetite.  Weekly choking spells.  No nausea, vomiting, diarrhea, constipation, melena or hematochezia. GU:  Overactive bladder.  No urgency, frequency, dysuria, or hematuria. Musculoskeletal:   Chronic back pain (h/o back surgery; DDD) seeing orthopedics.  No joint pain.  No muscle tenderness. Extremities:  No pain or swelling. Skin:  No rashes or skin changes. Neuro:  Sciatic nerve issues (shooting pain down right leg).  Right leg gives away.  No headache, numbness or weakness, balance or coordination issues. Endocrine:  No diabetes.  h/o thyroid cancer.  No hot flashes or night sweats. Psych:  No mood changes, depression or anxiety. Pain:  No focal pain. Review of systems:  All other systems reviewed and found to be negative.  Physical Exam: Blood pressure 167/64, pulse 59, temperature 97 F (36.1 C), temperature source Tympanic, resp. rate 18, height 5' 5.5" (1.664 m), weight 139 lb 12.4  oz (63.4 kg). GENERAL:  Well developed, well nourished, sitting comfortably in the exam room in no acute distress.  She has a cane at her side. MENTAL STATUS:  Alert and oriented to person, place and time. HEAD:  Short gray hair.  Normocephalic, atraumatic, face symmetric, no Cushingoid features. EYES:  Brown eyes.  No conjunctivitis or scleral icterus. NEUROLOGICAL: Unremarkable. PSYCH:  Appropriate.  No visits with results within 3 Day(s) from this visit. Latest known visit with results is:  Office Visit on 11/02/2015  Component Date Value Ref Range Status  . Free T4 11/02/2015 1.23* 0.61 - 1.12 ng/dL Final    Assessment:  DAYLE SHERPA is a 82 y.o. female with a history of stage IB right breast cancer (2013) and thyroid cancer (1999).  She presented  with neck fullness.  She underwent thyrodectomy and I-131.  She has a substernal goiter.  Chest CT on 08/01/2011 revealed a 3.8 x 3.9 x 5.7 cm mass in the superior mediastinum lying posterior to the trachea and displacing the esophagus toward the right along its upper portion.  More distally the thoracic esophagus appears normal. Decision was made for observation  She was diagnosed with with right breast cancer in 10/2011.  Pathology revealed a T1bN0M0 tumor which was ER positive, PR posiitive and her2/neu negative.  She underwent lumpectomy followed by radiation.  She began Arimidex after radiation.  She discontinued Arimidex this year.    Mammogram on 11/17/2015 revealed no evidence of malignancy. CA27.29 was 35.6 on 05/04/2015 and 33.4 on 11/02/2015.  She has a history of esophageal dilatation x 2 (last 15 years ago).  Chest CT on 11/09/2015 revealed a 3.7 x 4.0 mediastinal mass posterior to the mid trachea which was stable in size from 08/01/2011.  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 It exerted significant mass effect on the upper esophagus.  Barium swallow on 11/21/2015 revealed deviation of the  cervical esophagus to the right from previously identified paratracheal mass lesion.  There were no motility issues with her esophagus.   Symptomatically, she notes ongoing choking episodes.  Exam is stable.  Plan: 1.  Review labs, chest CT, and mammogram. 2.  Review evaluation by gastroenterology (no esophageal duplication cyst). 3.  Discuss prior tumor board discussions.  Discuss unclear etiology of mass (goiter, thyroid malignancy or other).  Concern mass represents thyroid tissue (cancer or benign) given elevated thyroglobulin.  Discuss I-131 imaging to determine if etiology is thyroid tissue.  Discuss representing case at tumor board. 4.  Schedule I-131 scan. 5.  Present at tumor board. 6.  RTC in 2 weeks for MD assessment and discussion regarding direction of therapy.   Lequita Asal, MD  11/23/2015, 11:01 AM

## 2015-11-25 ENCOUNTER — Other Ambulatory Visit: Payer: Self-pay | Admitting: *Deleted

## 2015-11-25 DIAGNOSIS — C73 Malignant neoplasm of thyroid gland: Secondary | ICD-10-CM

## 2015-11-28 ENCOUNTER — Telehealth: Payer: Self-pay | Admitting: Hematology and Oncology

## 2015-11-28 ENCOUNTER — Telehealth: Payer: Self-pay

## 2015-11-28 NOTE — Telephone Encounter (Signed)
Pt contacted and she can take synthroid and they do rec: low iodine diet starting today and until she has scan. Went over the low iodine diet and she is familiar because she had same diet last time. She understands that she can call if any questions.

## 2015-11-28 NOTE — Telephone Encounter (Signed)
Patient wants to make sure it's okay for her to keep taking all of her medications during the testing next week. Please call her to advise. Thanks.

## 2015-11-28 NOTE — Telephone Encounter (Signed)
Returned pt's phone call.  Per nuclear medicine pt is ok to take her thyroid medication and other medications.  Will contact Nuclear medicine regarding pt's question about diet.  No other concerns noted

## 2015-12-05 ENCOUNTER — Encounter
Admission: RE | Admit: 2015-12-05 | Discharge: 2015-12-05 | Disposition: A | Discharge status: Home / Self Care | Payer: Medicare Other | Source: Ambulatory Visit | Attending: Hematology and Oncology | Admitting: Hematology and Oncology

## 2015-12-05 DIAGNOSIS — C73 Malignant neoplasm of thyroid gland: Secondary | ICD-10-CM | POA: Insufficient documentation

## 2015-12-05 MED ORDER — THYROTROPIN ALFA 1.1 MG IM SOLR: 0.9000 mg | INTRAMUSCULAR | Status: DC

## 2015-12-06 ENCOUNTER — Encounter
Admission: RE | Admit: 2015-12-06 | Discharge: 2015-12-06 | Disposition: A | Discharge status: Home / Self Care | Payer: Medicare Other | Source: Ambulatory Visit | Attending: Hematology and Oncology | Admitting: Hematology and Oncology

## 2015-12-06 DIAGNOSIS — C73 Malignant neoplasm of thyroid gland: Secondary | ICD-10-CM | POA: Diagnosis not present

## 2015-12-06 MED ORDER — THYROTROPIN ALFA 1.1 MG IM SOLR
0.9000 mg | INTRAMUSCULAR | Status: AC
  Administered 2015-12-06: 0.9 mg via INTRAMUSCULAR
  Filled 2015-12-06: qty 0.9

## 2015-12-07 ENCOUNTER — Encounter
Admission: RE | Admit: 2015-12-07 | Discharge: 2015-12-07 | Disposition: A | Discharge status: Home / Self Care | Payer: Medicare Other | Source: Ambulatory Visit | Attending: Hematology and Oncology | Admitting: Hematology and Oncology

## 2015-12-07 ENCOUNTER — Ambulatory Visit: Payer: 59 | Admitting: Hematology and Oncology

## 2015-12-07 DIAGNOSIS — C73 Malignant neoplasm of thyroid gland: Secondary | ICD-10-CM | POA: Diagnosis not present

## 2015-12-07 MED ORDER — SODIUM IODIDE I 131 CAPSULE
4.0700 | Freq: Once | INTRAVENOUS | Status: AC | PRN
  Administered 2015-12-07: 4.07 via ORAL

## 2015-12-09 ENCOUNTER — Encounter
Admission: RE | Admit: 2015-12-09 | Discharge: 2015-12-09 | Disposition: A | Discharge status: Home / Self Care | Payer: Medicare Other | Source: Ambulatory Visit | Attending: Hematology and Oncology | Admitting: Hematology and Oncology

## 2015-12-09 DIAGNOSIS — R1310 Dysphagia, unspecified: Secondary | ICD-10-CM | POA: Insufficient documentation

## 2015-12-09 DIAGNOSIS — E890 Postprocedural hypothyroidism: Secondary | ICD-10-CM | POA: Insufficient documentation

## 2015-12-09 DIAGNOSIS — C73 Malignant neoplasm of thyroid gland: Secondary | ICD-10-CM | POA: Diagnosis not present

## 2015-12-14 ENCOUNTER — Encounter: Payer: Self-pay | Admitting: Hematology and Oncology

## 2015-12-14 ENCOUNTER — Inpatient Hospital Stay (HOSPITAL_BASED_OUTPATIENT_CLINIC_OR_DEPARTMENT_OTHER): Payer: Medicare Other | Admitting: Hematology and Oncology

## 2015-12-14 VITALS — BP 149/73 | HR 59 | Temp 96.3°F | Resp 18 | Ht 65.5 in | Wt 138.8 lb

## 2015-12-14 DIAGNOSIS — E049 Nontoxic goiter, unspecified: Secondary | ICD-10-CM

## 2015-12-14 DIAGNOSIS — Z85850 Personal history of malignant neoplasm of thyroid: Secondary | ICD-10-CM | POA: Diagnosis not present

## 2015-12-14 DIAGNOSIS — Z9223 Personal history of estrogen therapy: Secondary | ICD-10-CM

## 2015-12-14 DIAGNOSIS — R1319 Other dysphagia: Secondary | ICD-10-CM

## 2015-12-14 DIAGNOSIS — G8929 Other chronic pain: Secondary | ICD-10-CM

## 2015-12-14 DIAGNOSIS — Z923 Personal history of irradiation: Secondary | ICD-10-CM

## 2015-12-14 DIAGNOSIS — Z853 Personal history of malignant neoplasm of breast: Secondary | ICD-10-CM

## 2015-12-14 DIAGNOSIS — R1310 Dysphagia, unspecified: Secondary | ICD-10-CM

## 2015-12-14 DIAGNOSIS — M549 Dorsalgia, unspecified: Secondary | ICD-10-CM

## 2015-12-14 DIAGNOSIS — E039 Hypothyroidism, unspecified: Secondary | ICD-10-CM

## 2015-12-14 DIAGNOSIS — Z170 Estrogen receptor positive status [ER+]: Secondary | ICD-10-CM | POA: Diagnosis not present

## 2015-12-14 DIAGNOSIS — I272 Other secondary pulmonary hypertension: Secondary | ICD-10-CM

## 2015-12-14 DIAGNOSIS — C73 Malignant neoplasm of thyroid gland: Secondary | ICD-10-CM

## 2015-12-14 DIAGNOSIS — K228 Other specified diseases of esophagus: Secondary | ICD-10-CM

## 2015-12-14 DIAGNOSIS — F17210 Nicotine dependence, cigarettes, uncomplicated: Secondary | ICD-10-CM

## 2015-12-14 NOTE — Progress Notes (Signed)
Brother in law currently hospitalized outcome being positive not so good.  Pt remains with the same pain in right hip, back and leg. Pt remains with difficulty swallowing.  Pt reports day she took tablet for scan she didn't good at all.  She just wanted to lay down and felt exhausted.  Where scan took place said it did not have any side effects.

## 2015-12-14 NOTE — Progress Notes (Signed)
[facility]-  Cancer Center  Clinic day:  12/14/2015   Chief Complaint: Rose Irwin is a 82 y.o. female with a history of thyroid carcinoma and breast cancer who is seen for review of interval I-131 scan and discussion regarding direction of therapy.  HPI: The patient was last seen in the medical oncology clinic on 11/23/2015.  At that time, several imaging studies were reviewed.  Chest CT on 11/09/2015 revealed a 3.7 x 4.0 mediastinal mass posterior to the mid trachea (stable in size from 08/01/2011). Barium swallow on 11/21/2015 revealed deviation of the cervical esophagus to the right from previously identified paratracheal mass lesion.  There were no motility issues with her esophagus. Thyroglobulin was elevated.  Given her ongoing swallowing difficulties, decision was made to pursue a I-131 scan to determine if this represented thyroid tissue (benign or malignant).  I-131 scan with Thyrogen on 12/03/2015 revealed a large focus of intense radiotracer uptake within the superior mediastinum corresponding to the mediastinal mass identified on recent chest CT. This was compatible with residual functioning thyroid tissue. Findings are compatible with recurrent thyroid cancer or normal thyroid tissue associated with sub sternal extension of previous thyroid goiter  Symptomatically, she continues to get "strangled on liquids".  Solid food "goes down slower and "pauses".  She has to puree' her food.   Past Medical History  Diagnosis Date  . Cancer ([facility])     breast, thyroid  . Hypothyroidism   . Skin cancer   . Anxiety   . Hypertension   . Elevated lipids   . Arthritis   . GERD (gastroesophageal reflux disease)   . Osteopenia   . Hemorrhoids   . Depression   . Bronchitis   . COPD (chronic obstructive pulmonary disease) ([facility])   . Complication of anesthesia     smothering  . Neuropathy ([facility])     rt leg  . Breast cancer ([facility]) 11/21/2011    right breast cancer -  radiation    Past Surgical History  Procedure Laterality Date  . Thyroid surgery    . Breast surgery Right   . Bladder polyps    . Abdominal hysterectomy    . Back surgery      lumbar  . Lung surgery    . Cystoscopy w/ retrogrades Bilateral 02/09/2015    Procedure: CYSTOSCOPY WITH RETROGRADE PYELOGRAM;  Surgeon: Ashley Brandon, MD;  Location: [facility] ORS;  Service: Urology;  Laterality: Bilateral;    Family History  Problem Relation Age of Onset  . Breast cancer Mother   . Breast cancer Sister   . Breast cancer Sister     Social History:  reports that she has been smoking Cigarettes.  She has been smoking about 0.25 packs per day. She does not have any smokeless tobacco history on file. She reports that she does not drink alcohol or use illicit drugs.  She is smoking 1/4 pack a day. The patient is accompanied by her sister today.  Allergies:  Allergies  Allergen Reactions  . Codeine Other (See Comments)    Go limp, sweat  . Sulfa Antibiotics Swelling    tongue  . Aspirin Palpitations    Current Medications: Current Outpatient Prescriptions  Medication Sig Dispense Refill  . albuterol-ipratropium (COMBIVENT) 18-103 MCG/ACT inhaler Inhale 1 puff into the lungs every 4 (four) hours as needed for wheezing or shortness of breath.    . ALPRAZolam (XANAX) 0.5 MG tablet TAKE ONE TABLET TWICE DAILY AS NEEDED FOR ANXIETY 60 tablet   0  . aspirin EC 81 MG tablet Take 81 mg by mouth daily.    . Fluticasone-Salmeterol (ADVAIR DISKUS) 250-50 MCG/DOSE AEPB Inhale 1 puff into the lungs every 12 (twelve) hours as needed.    Marland Kitchen losartan-hydrochlorothiazide (HYZAAR) 50-12.5 MG tablet Take by mouth.    . metoprolol (LOPRESSOR) 50 MG tablet Take 50 mg by mouth 2 (two) times daily.  3  . metoprolol succinate (TOPROL-XL) 50 MG 24 hr tablet Take 50 mg by mouth 2 (two) times daily. Take with or immediately following a meal.    . PARoxetine (PAXIL) 20 MG tablet Take 20 mg by mouth daily.    .  simvastatin (ZOCOR) 40 MG tablet Take 40 mg by mouth daily at 6 PM.    . SYNTHROID 100 MCG tablet TAKE ONE (1) TABLET BY MOUTH EVERY DAY 30 tablet 0   No current facility-administered medications for this visit.    Review of Systems:  GENERAL:  Feels "ok".  Active.  No fevers or sweats.  Weight stable. PERFORMANCE STATUS (ECOG):  1 HEENT:  No visual changes, runny nose, sore throat, mouth sores or tenderness. Lungs: No shortness of breath or cough.  No hemoptysis. Cardiac:  No chest pain, palpitations, orthopnea, or PND. GI:  Poor appetite.  Weekly choking spells.  No nausea, vomiting, diarrhea, constipation, melena or hematochezia. GU:  Overactive bladder.  No urgency, frequency, dysuria, or hematuria. Musculoskeletal:   Chronic back pain (h/o back surgery; DDD) seeing orthopedics.  No joint pain.  No muscle tenderness. Extremities:  No pain or swelling. Skin:  No rashes or skin changes. Neuro:  Sciatic nerve issues (shooting pain down right leg).  Right leg gives away.  No headache, numbness or weakness, balance or coordination issues. Endocrine:  No diabetes.  h/o thyroid cancer.  No hot flashes or night sweats. Psych:  No mood changes, depression or anxiety. Pain:  No focal pain. Review of systems:  All other systems reviewed and found to be negative.  Physical Exam: Blood pressure 149/73, pulse 59, temperature 96.3 F (35.7 C), temperature source Tympanic, resp. rate 18, height 5' 5.5" (1.664 m), weight 138 lb 12.5 oz (62.95 kg). GENERAL:  Well developed, well nourished, sitting comfortably in the exam room in no acute distress.  She has a cane at her side. MENTAL STATUS:  Alert and oriented to person, place and time. HEAD:  Short styled gray hair.  Normocephalic, atraumatic, face symmetric, no Cushingoid features. EYES:  Brown eyes.  No conjunctivitis or scleral icterus. NEUROLOGICAL: Unremarkable. PSYCH:  Appropriate.  No visits with results within 3 Day(s) from this  visit. Latest known visit with results is:  Office Visit on 11/02/2015  Component Date Value Ref Range Status  . Free T4 11/02/2015 1.23* 0.61 - 1.12 ng/dL Final    Assessment:  Rose Irwin is a 82 y.o. female with a history of stage IB right breast cancer (2013) and thyroid cancer (1999).  She presented with neck fullness.  She underwent thyrodectomy and I-131.  She has a substernal goiter.  Chest CT on 08/01/2011 revealed a 3.8 x 3.9 x 5.7 cm mass in the superior mediastinum lying posterior to the trachea and displacing the esophagus toward the right along its upper portion.  More distally the thoracic esophagus appears normal. Decision was made for observation  She was diagnosed with with right breast cancer in 10/2011.  Pathology revealed a T1bN0M0 tumor which was ER positive, PR posiitive and her2/neu negative.  She underwent lumpectomy followed  by radiation.  She began Arimidex after radiation.  She discontinued Arimidex this year.    Mammogram on 11/17/2015 revealed no evidence of malignancy. CA27.29 was 35.6 on 05/04/2015 and 33.4 on 11/02/2015.  She has a history of esophageal dilatation x 2 (last 15 years ago).  Chest CT on 11/09/2015 revealed a 3.7 x 4.0 mediastinal mass posterior to the mid trachea which was stable in size from 08/01/2011.  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 It exerted significant mass effect on the upper esophagus.    Barium swallow on 11/21/2015 revealed deviation of the cervical esophagus to the right from previously identified paratracheal mass lesion.  There were no motility issues with her esophagus.   I-131 scan with Thyrogen on 12/03/2015 revealed a large focus of intense radiotracer uptake within the superior mediastinum corresponding to the mediastinal mass identified on chest CT and compatible with residual functioning thyroid tissue (recurrent thyroid cancer or normal thyroid tissue).  Symptomatically, she notes  ongoing choking episodes.  Exam is stable.  Plan: 1.  Review I-131 Thyrogen scan.  Mediastinal mass represents thyroid tissue (benign versus malignant).  She has had this mass for some time.  I-131 scan reveals no evidence of other disease.  She has seen surgery in the past as well as sought out opinions from [facility].  She is concerned about surgery.  She is willing to consider a biopsy.  We discussed presentation at tumor board. 2.  Present at tumor board. 3.  Phone follow-up after tumor board ([phone]). 4.  RTC 1 month for MD assessment.   Melissa C Corcoran, MD  12/14/2015, 10:18 AM  

## 2015-12-16 ENCOUNTER — Encounter: Payer: Self-pay | Admitting: Hematology and Oncology

## 2015-12-21 ENCOUNTER — Telehealth: Payer: Self-pay | Admitting: *Deleted

## 2015-12-21 NOTE — Telephone Encounter (Signed)
Called pt and let her know that pt can see dr Genevive Bi about her thyroid and if he cna do bx. She is agreeable to appt 6/2 11 am and I gave her directions. I also spoke to dr Genevive Bi this am and he confirmed that he would see her.

## 2015-12-23 ENCOUNTER — Ambulatory Visit (INDEPENDENT_AMBULATORY_CARE_PROVIDER_SITE_OTHER): Payer: Medicare Other | Admitting: Cardiothoracic Surgery

## 2015-12-23 ENCOUNTER — Encounter: Payer: Self-pay | Admitting: Cardiothoracic Surgery

## 2015-12-23 ENCOUNTER — Other Ambulatory Visit: Payer: Self-pay

## 2015-12-23 VITALS — BP 129/55 | HR 68 | Temp 98.3°F | Ht 66.0 in | Wt 137.0 lb

## 2015-12-23 DIAGNOSIS — E049 Nontoxic goiter, unspecified: Secondary | ICD-10-CM | POA: Diagnosis not present

## 2015-12-23 NOTE — Progress Notes (Signed)
Patient ID: Rose Irwin, female   DOB: Mar 19, 1932, 82 y.o.   MRN: [id number]  Chief Complaint  Patient presents with  . Other    Thyroid Nodule    Referred By Dr. Mike Gip Reason for Referral mediastinal mass  HPI Location, Quality, Duration, Severity, Timing, Context, Modifying Factors, Associated Signs and Symptoms.  Rose Irwin is a 82 y.o. female.  This patient has a long, located history. Apparently back in 1999 she underwent a thyroid resection for goiter and for carcinoma. She states she had several operations done. At some point in the late 1990s she underwent esophagoscopy and had 2 esophageal dilatations. She's been followed ever since and recently had a diagnosis of breast cancer made in 2013. She continues to follow-up with Dr. Mike Gip for both her breast and thyroid cancer. A recent T scan of the chest revealed a 39 mm mass in the mediastinum centered posterior to the trachea. On further review of prior imaging this has been present since at least 2007. In fact it is now slightly smaller than has been in the past. The patient's main symptom is that of food being stuck in the proximal esophagus. She states it's difficult to swallow meat products and large pills. Liquids and softer foods go down fine. She tells me she's had a 12 pound weight loss over the last 6 months although according to our records this has only been 3 pounds. She states she does have some hoarseness which has been present before. She is a smoker. Her oxygen saturations were in the upper 80s today and on recheck were 89-90% after a substantial rest period.     Past Medical History  Diagnosis Date  . Cancer [facility])     breast, thyroid  . Hypothyroidism   . Skin cancer   . Anxiety   . Hypertension   . Elevated lipids   . Arthritis   . GERD (gastroesophageal reflux disease)   . Osteopenia   . Hemorrhoids   . Depression   . Bronchitis   . COPD (chronic obstructive pulmonary disease) ([place])   .  Complication of anesthesia     smothering  . Neuropathy ([facility])     rt leg  . Breast cancer ([place]) 11/21/2011    right breast cancer - radiation    Past Surgical History  Procedure Laterality Date  . Thyroid surgery    . Breast surgery Right   . Bladder polyps    . Abdominal hysterectomy    . Back surgery      lumbar  . Lung surgery    . Cystoscopy w/ retrogrades Bilateral 02/09/2015    Procedure: CYSTOSCOPY WITH RETROGRADE PYELOGRAM;  Surgeon: Hollice Espy, MD;  Location: [facility] ORS;  Service: Urology;  Laterality: Bilateral;    Family History  Problem Relation Age of Onset  . Breast cancer Mother   . Breast cancer Sister   . Breast cancer Sister     Social History Social History  Substance Use Topics  . Smoking status: Current Every Day Smoker -- 7.00 packs/day for 30 years    Types: Cigarettes    Last Attempt to Quit: 10/20/2014  . Smokeless tobacco: None  . Alcohol Use: No    Allergies  Allergen Reactions  . Codeine Other (See Comments)    Go limp, sweat  . Sulfa Antibiotics Swelling    tongue  . Aspirin Palpitations    Current Outpatient Prescriptions  Medication Sig Dispense Refill  . albuterol-ipratropium (COMBIVENT) 18-103 MCG/ACT inhaler  Inhale 1 puff into the lungs every 4 (four) hours as needed for wheezing or shortness of breath.    . ALPRAZolam (XANAX) 0.5 MG tablet TAKE ONE TABLET TWICE DAILY AS NEEDED FOR ANXIETY 60 tablet 0  . aspirin EC 81 MG tablet Take 81 mg by mouth daily.    . COMBIVENT RESPIMAT 20-100 MCG/ACT AERS respimat Inhale 1 puff into the lungs 4 (four) times daily.  6  . Fluticasone-Salmeterol (ADVAIR DISKUS) 250-50 MCG/DOSE AEPB Inhale 1 puff into the lungs every 12 (twelve) hours as needed.    Marland Kitchen losartan-hydrochlorothiazide (HYZAAR) 50-12.5 MG tablet Take by mouth.    . metoprolol (LOPRESSOR) 50 MG tablet Take 50 mg by mouth 2 (two) times daily.  3  . metoprolol succinate (TOPROL-XL) 50 MG 24 hr tablet Take 50 mg by mouth 2 (two) times  daily. Take with or immediately following a meal.    . PARoxetine (PAXIL) 20 MG tablet Take 20 mg by mouth daily.    . simvastatin (ZOCOR) 40 MG tablet Take 40 mg by mouth daily at 6 PM.    . SYNTHROID 100 MCG tablet TAKE ONE (1) TABLET BY MOUTH EVERY DAY 30 tablet 0   No current facility-administered medications for this visit.      Review of Systems A complete review of systems was asked and was negative except for the following positive findingsSwelling of her lower extremities, joint pain, easy bruising  Blood pressure 129/55, pulse 68, temperature 98.3 F (36.8 C), temperature source Oral, height 5\' 6"  (1.676 m), weight 137 lb (62.143 kg), SpO2 86 %.  Physical Exam CONSTITUTIONAL:  Pleasant, well-developed, well-nourished, and in no acute distress. EYES: Pupils equal and reactive to light, Sclera non-icteric EARS, NOSE, MOUTH AND THROAT:  The oropharynx was clear.  Dentition is good repair.  Oral mucosa pink and moist. LYMPH NODES:  Lymph nodes in the neck and axillae were normal RESPIRATORY:  Lungs were very distant.  Normal respiratory effort without pathologic use of accessory muscles of respiration CARDIOVASCULAR: Heart was regular without murmurs.  There were no carotid bruits. GI: The abdomen was soft, nontender, and nondistended. There were no palpable masses. There was no hepatosplenomegaly. There were normal bowel sounds in all quadrants. GU:  Rectal deferred.   MUSCULOSKELETAL:  Normal muscle strength and tone.  No clubbing or cyanosis.   SKIN:  There were no pathologic skin lesions.  There were no nodules on palpation. NEUROLOGIC:  Sensation is normal.  Cranial nerves are grossly intact. PSYCH:  Oriented to person, place and time.  Mood and affect are normal.  Data Reviewed CT scans and upper GI  I have personally reviewed the patient's imaging, laboratory findings and medical records.    Assessment    I have independently reviewed the patient's CT scan and upper  GI. There is no evidence of esophageal compression on the upper GI series. The CT scans aback she shown a slightly smaller tumor now compared to 2007.    Plan    I had a long discussion with the patient regarding the options. I do not believe she is a candidate for surgical resection as there is no evidence that this is actually the cause of her symptoms. In addition her oxygen saturations of 88% may preclude thoracotomy. I did ask her to make an appointment with Dr. Vira Agar for possible endoscopy to see if there is any intrinsic esophageal problems. She said that Dr. Vira Agar told her there is nothing further he could do. I  therefore asked that she be one to see someone else and she is agreed to see Dr. Lucilla Lame in our group. We'll go ahead and set her up for that now see her back to follow.       Nestor Lewandowsky, MD 12/23/2015, 12:04 PM

## 2015-12-23 NOTE — Patient Instructions (Signed)
Please go to your appointment to have your Endoscopy done. Then we will see you the following week.

## 2015-12-26 ENCOUNTER — Telehealth: Payer: Self-pay | Admitting: Gastroenterology

## 2015-12-26 NOTE — Telephone Encounter (Signed)
Noted  

## 2015-12-26 NOTE — Telephone Encounter (Signed)
Fyi... Dr. Genevive Bi wanted pt to have EGD. Was scheduled for Thursday, June 8th. Pt decided she didn't want this done. [place] has been notified of cancellation.

## 2015-12-26 NOTE — Telephone Encounter (Signed)
Patient called to cancel her procedure 6/8. She stated she decided she didn't want to have it done.

## 2015-12-29 ENCOUNTER — Ambulatory Visit: Admission: RE | Admit: 2015-12-29 | Payer: Medicare Other | Source: Ambulatory Visit | Admitting: Gastroenterology

## 2015-12-29 ENCOUNTER — Encounter: Admission: RE | Payer: Self-pay | Source: Ambulatory Visit

## 2015-12-29 SURGERY — ESOPHAGOGASTRODUODENOSCOPY (EGD) WITH PROPOFOL
Anesthesia: Choice

## 2016-01-03 ENCOUNTER — Ambulatory Visit: Payer: Self-pay | Admitting: Cardiothoracic Surgery

## 2016-01-11 ENCOUNTER — Other Ambulatory Visit: Payer: Self-pay | Admitting: Hematology and Oncology

## 2016-01-11 ENCOUNTER — Encounter: Payer: Self-pay | Admitting: Hematology and Oncology

## 2016-01-11 ENCOUNTER — Inpatient Hospital Stay: Payer: Medicare Other | Attending: Hematology and Oncology | Admitting: Hematology and Oncology

## 2016-01-11 VITALS — BP 160/67 | HR 60 | Temp 95.8°F | Resp 18 | Ht 66.0 in | Wt 137.5 lb

## 2016-01-11 DIAGNOSIS — E049 Nontoxic goiter, unspecified: Secondary | ICD-10-CM

## 2016-01-11 DIAGNOSIS — K219 Gastro-esophageal reflux disease without esophagitis: Secondary | ICD-10-CM | POA: Diagnosis not present

## 2016-01-11 DIAGNOSIS — C73 Malignant neoplasm of thyroid gland: Secondary | ICD-10-CM

## 2016-01-11 DIAGNOSIS — M1990 Unspecified osteoarthritis, unspecified site: Secondary | ICD-10-CM | POA: Diagnosis not present

## 2016-01-11 DIAGNOSIS — I10 Essential (primary) hypertension: Secondary | ICD-10-CM | POA: Diagnosis not present

## 2016-01-11 DIAGNOSIS — Z79899 Other long term (current) drug therapy: Secondary | ICD-10-CM

## 2016-01-11 DIAGNOSIS — R1310 Dysphagia, unspecified: Secondary | ICD-10-CM | POA: Diagnosis not present

## 2016-01-11 DIAGNOSIS — Z853 Personal history of malignant neoplasm of breast: Secondary | ICD-10-CM | POA: Diagnosis not present

## 2016-01-11 DIAGNOSIS — J449 Chronic obstructive pulmonary disease, unspecified: Secondary | ICD-10-CM | POA: Diagnosis not present

## 2016-01-11 DIAGNOSIS — M8580 Other specified disorders of bone density and structure, unspecified site: Secondary | ICD-10-CM

## 2016-01-11 DIAGNOSIS — Z85850 Personal history of malignant neoplasm of thyroid: Secondary | ICD-10-CM

## 2016-01-11 DIAGNOSIS — E0789 Other specified disorders of thyroid: Secondary | ICD-10-CM | POA: Diagnosis not present

## 2016-01-11 DIAGNOSIS — Z803 Family history of malignant neoplasm of breast: Secondary | ICD-10-CM | POA: Diagnosis not present

## 2016-01-11 DIAGNOSIS — Z9223 Personal history of estrogen therapy: Secondary | ICD-10-CM

## 2016-01-11 DIAGNOSIS — E039 Hypothyroidism, unspecified: Secondary | ICD-10-CM | POA: Diagnosis not present

## 2016-01-11 DIAGNOSIS — F17210 Nicotine dependence, cigarettes, uncomplicated: Secondary | ICD-10-CM

## 2016-01-11 DIAGNOSIS — Z85828 Personal history of other malignant neoplasm of skin: Secondary | ICD-10-CM | POA: Insufficient documentation

## 2016-01-11 DIAGNOSIS — Z7982 Long term (current) use of aspirin: Secondary | ICD-10-CM

## 2016-01-11 DIAGNOSIS — Z923 Personal history of irradiation: Secondary | ICD-10-CM

## 2016-01-11 NOTE — Progress Notes (Signed)
Pt reports same moderate fatigue.  SOB on exertion no other changes.  Younger sister passed away this week.Marland Kitchen

## 2016-03-14 ENCOUNTER — Inpatient Hospital Stay: Payer: Medicare Other | Attending: Hematology and Oncology

## 2016-03-14 ENCOUNTER — Encounter: Payer: Self-pay | Admitting: Hematology and Oncology

## 2016-03-14 ENCOUNTER — Other Ambulatory Visit: Payer: Self-pay | Admitting: *Deleted

## 2016-03-14 ENCOUNTER — Telehealth: Payer: Self-pay

## 2016-03-14 ENCOUNTER — Inpatient Hospital Stay (HOSPITAL_BASED_OUTPATIENT_CLINIC_OR_DEPARTMENT_OTHER): Payer: Medicare Other | Admitting: Hematology and Oncology

## 2016-03-14 VITALS — BP 118/68 | HR 45 | Temp 96.6°F | Resp 18 | Wt 137.8 lb

## 2016-03-14 DIAGNOSIS — J449 Chronic obstructive pulmonary disease, unspecified: Secondary | ICD-10-CM | POA: Diagnosis not present

## 2016-03-14 DIAGNOSIS — E049 Nontoxic goiter, unspecified: Secondary | ICD-10-CM

## 2016-03-14 DIAGNOSIS — R634 Abnormal weight loss: Secondary | ICD-10-CM | POA: Insufficient documentation

## 2016-03-14 DIAGNOSIS — G629 Polyneuropathy, unspecified: Secondary | ICD-10-CM | POA: Diagnosis not present

## 2016-03-14 DIAGNOSIS — Z9223 Personal history of estrogen therapy: Secondary | ICD-10-CM | POA: Diagnosis not present

## 2016-03-14 DIAGNOSIS — M8580 Other specified disorders of bone density and structure, unspecified site: Secondary | ICD-10-CM

## 2016-03-14 DIAGNOSIS — M1990 Unspecified osteoarthritis, unspecified site: Secondary | ICD-10-CM | POA: Diagnosis not present

## 2016-03-14 DIAGNOSIS — R630 Anorexia: Secondary | ICD-10-CM

## 2016-03-14 DIAGNOSIS — Z923 Personal history of irradiation: Secondary | ICD-10-CM | POA: Diagnosis not present

## 2016-03-14 DIAGNOSIS — Z170 Estrogen receptor positive status [ER+]: Secondary | ICD-10-CM

## 2016-03-14 DIAGNOSIS — E039 Hypothyroidism, unspecified: Secondary | ICD-10-CM

## 2016-03-14 DIAGNOSIS — Z79899 Other long term (current) drug therapy: Secondary | ICD-10-CM | POA: Insufficient documentation

## 2016-03-14 DIAGNOSIS — Z85850 Personal history of malignant neoplasm of thyroid: Secondary | ICD-10-CM | POA: Insufficient documentation

## 2016-03-14 DIAGNOSIS — I10 Essential (primary) hypertension: Secondary | ICD-10-CM | POA: Insufficient documentation

## 2016-03-14 DIAGNOSIS — K219 Gastro-esophageal reflux disease without esophagitis: Secondary | ICD-10-CM

## 2016-03-14 DIAGNOSIS — F17210 Nicotine dependence, cigarettes, uncomplicated: Secondary | ICD-10-CM

## 2016-03-14 DIAGNOSIS — Z7982 Long term (current) use of aspirin: Secondary | ICD-10-CM

## 2016-03-14 DIAGNOSIS — R1310 Dysphagia, unspecified: Secondary | ICD-10-CM | POA: Insufficient documentation

## 2016-03-14 DIAGNOSIS — Z85828 Personal history of other malignant neoplasm of skin: Secondary | ICD-10-CM | POA: Diagnosis not present

## 2016-03-14 DIAGNOSIS — Z853 Personal history of malignant neoplasm of breast: Secondary | ICD-10-CM | POA: Diagnosis not present

## 2016-03-14 DIAGNOSIS — C73 Malignant neoplasm of thyroid gland: Secondary | ICD-10-CM

## 2016-03-14 DIAGNOSIS — C50911 Malignant neoplasm of unspecified site of right female breast: Secondary | ICD-10-CM

## 2016-03-14 LAB — COMPREHENSIVE METABOLIC PANEL
ALT: 14 U/L (ref 14–54)
AST: 17 U/L (ref 15–41)
Albumin: 3.8 g/dL (ref 3.5–5.0)
Alkaline Phosphatase: 81 U/L (ref 38–126)
Anion gap: 5 (ref 5–15)
BUN: 12 mg/dL (ref 6–20)
CO2: 36 mmol/L — ABNORMAL HIGH (ref 22–32)
Calcium: 9.4 mg/dL (ref 8.9–10.3)
Chloride: 96 mmol/L — ABNORMAL LOW (ref 101–111)
Creatinine, Ser: 0.68 mg/dL (ref 0.44–1.00)
GFR calc Af Amer: >60 mL/min (ref >60)
GFR calc non Af Amer: >60 mL/min (ref >60)
Glucose, Bld: 116 mg/dL — ABNORMAL HIGH (ref 65–99)
Potassium: 4 mmol/L (ref 3.5–5.1)
Sodium: 137 mmol/L (ref 135–145)
Total Bilirubin: 1.1 mg/dL (ref 0.3–1.2)
Total Protein: 6.7 g/dL (ref 6.5–8.1)

## 2016-03-14 LAB — CBC WITH DIFFERENTIAL/PLATELET
Basophils Absolute: 0 10*3/uL (ref 0–0.1)
Basophils Relative: 1 %
Eosinophils Absolute: 0.1 10*3/uL (ref 0–0.7)
Eosinophils Relative: 2 %
HCT: 43.4 % (ref 35.0–47.0)
Hemoglobin: 14.3 g/dL (ref 12.0–16.0)
Lymphocytes Relative: 25 %
Lymphs Abs: 1.2 10*3/uL (ref 1.0–3.6)
MCH: 27.9 pg (ref 26.0–34.0)
MCHC: 33 g/dL (ref 32.0–36.0)
MCV: 84.7 fL (ref 80.0–100.0)
Monocytes Absolute: 0.5 10*3/uL (ref 0.2–0.9)
Monocytes Relative: 10 %
Neutro Abs: 2.9 10*3/uL (ref 1.4–6.5)
Neutrophils Relative %: 62 %
Platelets: 204 10*3/uL (ref 150–440)
RBC: 5.13 MIL/uL (ref 3.80–5.20)
RDW: 13.4 % (ref 11.5–14.5)
WBC: 4.7 10*3/uL (ref 3.6–11.0)

## 2016-03-14 NOTE — Progress Notes (Signed)
Patient is here for follow up  

## 2016-03-14 NOTE — Progress Notes (Signed)
[place] [facility] day:  03/14/16   Chief Complaint: DAILY DOE is a 82 y.o. female with a history of thyroid carcinoma and breast cancer who is seen for 2 month assessment.  HPI: The patient was last seen in the medical oncology clinic on 01/11/2016.  At that time, consult from Dr. Genevive Bi was reviewed.  She was not felt to be a surgical candidate.  She was referred back to GI, Dr. Allen Norris.  EGD was cancelled on 12/29/2015.  Patient was not aware of the appointment.  Symptomatically, she states that she is "doing okay under the circumstances".  She continues to have swallowing difficulties up. She states that she is losing weight slowly.  She has lost an additional pound since her last visit.   Past Medical History:  Diagnosis Date  . Anxiety   . Arthritis   . Breast cancer ([place]) 11/21/2011   right breast cancer - radiation  . Bronchitis   . Cancer [facility])    breast, thyroid  . Complication of anesthesia    smothering  . COPD (chronic obstructive pulmonary disease) ([place])   . Depression   . Elevated lipids   . GERD (gastroesophageal reflux disease)   . Hemorrhoids   . Hypertension   . Hypothyroidism   . Neuropathy ([facility])    rt leg  . Osteopenia   . Skin cancer     Past Surgical History:  Procedure Laterality Date  . ABDOMINAL HYSTERECTOMY    . BACK SURGERY     lumbar  . bladder polyps    . BREAST SURGERY Right   . CYSTOSCOPY W/ RETROGRADES Bilateral 02/09/2015   Procedure: CYSTOSCOPY WITH RETROGRADE PYELOGRAM;  Surgeon: Hollice Espy, MD;  Location: [facility] ORS;  Service: Urology;  Laterality: Bilateral;  . LUNG SURGERY    . THYROID SURGERY      Family History  Problem Relation Age of Onset  . Breast cancer Mother   . Breast cancer Sister   . Breast cancer Sister     Social History:  reports that she has been smoking Cigarettes.  She has a 210.00 pack-year smoking history. She does not have any smokeless tobacco history on file. She  reports that she does not drink alcohol or use drugs.  She is smoking 1/4 pack a day. The patient is alone today.  Allergies:  Allergies  Allergen Reactions  . Codeine Other (See Comments)    Go limp, sweat  . Sulfa Antibiotics Swelling    tongue  . Aspirin Palpitations    Current Medications: Current Outpatient Prescriptions  Medication Sig Dispense Refill  . ALPRAZolam (XANAX) 0.5 MG tablet TAKE ONE TABLET TWICE DAILY AS NEEDED FOR ANXIETY 60 tablet 0  . aspirin EC 81 MG tablet Take 81 mg by mouth daily.    . Fluticasone-Salmeterol (ADVAIR DISKUS) 250-50 MCG/DOSE AEPB Inhale into the lungs.    Marland Kitchen levothyroxine (SYNTHROID, LEVOTHROID) 88 MCG tablet Take 88 mcg by mouth daily before breakfast.     . losartan-hydrochlorothiazide (HYZAAR) 50-12.5 MG tablet Take by mouth.    . metoprolol succinate (TOPROL-XL) 50 MG 24 hr tablet Take 50 mg by mouth 2 (two) times daily. Take with or immediately following a meal.    . PARoxetine (PAXIL) 20 MG tablet Take 20 mg by mouth daily.    . simvastatin (ZOCOR) 20 MG tablet Take by mouth.    . triamcinolone ointment (KENALOG) 0.1 % Reported on 01/11/2016    . albuterol-ipratropium (COMBIVENT)  18-103 MCG/ACT inhaler Inhale 1 puff into the lungs every 4 (four) hours as needed for wheezing or shortness of breath.    . COMBIVENT RESPIMAT 20-100 MCG/ACT AERS respimat Inhale 1 puff into the lungs 4 (four) times daily.  6   No current facility-administered medications for this visit.     Review of Systems:  GENERAL:  Feels "ok".  No fevers or sweats.  Weight loss of 1 pound. PERFORMANCE STATUS (ECOG):  1 HEENT:  No visual changes, runny nose, sore throat, mouth sores or tenderness. Lungs: No shortness of breath or cough.  No hemoptysis. Cardiac:  No chest pain, palpitations, orthopnea, or PND. GI:  Poor appetite.  Weekly choking spells.  No nausea, vomiting, diarrhea, constipation, melena or hematochezia. GU:  Overactive bladder.  No urgency, frequency,  dysuria, or hematuria. Musculoskeletal:   Chronic back pain (h/o back surgery; DDD) seeing orthopedics.  No joint pain.  No muscle tenderness. Extremities:  No pain or swelling. Skin:  No rashes or skin changes. Neuro:  Sciatic nerve issues (shooting pain down right leg).  Right leg gives away.  No headache, numbness or weakness, balance or coordination issues. Endocrine:  No diabetes.  h/o thyroid cancer.  No hot flashes or night sweats. Psych:  No mood changes, depression or anxiety. Pain:  No focal pain. Review of systems:  All other systems reviewed and found to be negative.  Physical Exam: Blood pressure 118/68, pulse (!) 45, temperature (!) 96.6 F (35.9 C), temperature source Tympanic, resp. rate 18, weight 137 lb 12.6 oz (62.5 kg). GENERAL:  Thin elderly woman sitting comfortably in the exam room in no acute distress.  She has a cane at her side. MENTAL STATUS:  Alert and oriented to person, place and time. HEAD:  Short styled gray hair.  Normocephalic, atraumatic, face symmetric, no Cushingoid features. EYES:  Brown eyes.  Pupils equal round and reactive to light and accomodation.  No conjunctivitis or scleral icterus. ENT:  Oropharynx clear without lesion.  Tongue normal. Mucous membranes moist.  RESPIRATORY:  Clear to auscultation without rales, wheezes or rhonchi. CARDIOVASCULAR:  Regular rate and rhythm without murmur, rub or gallop. BREAST:  Right breast with with post-operative changes (curved incision).  No discrete masses, skin changes or nipple discharge.  Left breast without masses, skin changes or nipple discharge.  Bilateral fibrocystic changes inferiorly (left > right). ABDOMEN:  Soft, non-tender, with active bowel sounds, and no hepatosplenomegaly.  No masses. SKIN:  No rashes, ulcers or lesions. EXTREMITIES: No edema, no skin discoloration or tenderness.  No palpable cords. LYMPH NODES: No palpable cervical, supraclavicular, axillary or inguinal adenopathy   NEUROLOGICAL: Unremarkable. PSYCH:  Appropriate   Appointment on 03/14/2016  Component Date Value Ref Range Status  . WBC 03/14/2016 4.7  3.6 - 11.0 K/uL Final  . RBC 03/14/2016 5.13  3.80 - 5.20 MIL/uL Final  . Hemoglobin 03/14/2016 14.3  12.0 - 16.0 g/dL Final  . HCT 03/14/2016 43.4  35.0 - 47.0 % Final  . MCV 03/14/2016 84.7  80.0 - 100.0 fL Final  . MCH 03/14/2016 27.9  26.0 - 34.0 pg Final  . MCHC 03/14/2016 33.0  32.0 - 36.0 g/dL Final  . RDW 03/14/2016 13.4  11.5 - 14.5 % Final  . Platelets 03/14/2016 204  150 - 440 K/uL Final  . Neutrophils Relative % 03/14/2016 62  % Final  . Neutro Abs 03/14/2016 2.9  1.4 - 6.5 K/uL Final  . Lymphocytes Relative 03/14/2016 25  % Final  .  Lymphs Abs 03/14/2016 1.2  1.0 - 3.6 K/uL Final  . Monocytes Relative 03/14/2016 10  % Final  . Monocytes Absolute 03/14/2016 0.5  0.2 - 0.9 K/uL Final  . Eosinophils Relative 03/14/2016 2  % Final  . Eosinophils Absolute 03/14/2016 0.1  0 - 0.7 K/uL Final  . Basophils Relative 03/14/2016 1  % Final  . Basophils Absolute 03/14/2016 0.0  0 - 0.1 K/uL Final  . Sodium 03/14/2016 137  135 - 145 mmol/L Final  . Potassium 03/14/2016 4.0  3.5 - 5.1 mmol/L Final  . Chloride 03/14/2016 96* 101 - 111 mmol/L Final  . CO2 03/14/2016 36* 22 - 32 mmol/L Final  . Glucose, Bld 03/14/2016 116* 65 - 99 mg/dL Final  . BUN 03/14/2016 12  6 - 20 mg/dL Final  . Creatinine, Ser 03/14/2016 0.68  0.44 - 1.00 mg/dL Final  . Calcium 03/14/2016 9.4  8.9 - 10.3 mg/dL Final  . Total Protein 03/14/2016 6.7  6.5 - 8.1 g/dL Final  . Albumin 03/14/2016 3.8  3.5 - 5.0 g/dL Final  . AST 03/14/2016 17  15 - 41 U/L Final  . ALT 03/14/2016 14  14 - 54 U/L Final  . Alkaline Phosphatase 03/14/2016 81  38 - 126 U/L Final  . Total Bilirubin 03/14/2016 1.1  0.3 - 1.2 mg/dL Final  . GFR calc non Af Amer 03/14/2016 >60  >60 mL/min Final  . GFR calc Af Amer 03/14/2016 >60  >60 mL/min Final   Comment: (NOTE) The eGFR has been calculated using  the CKD EPI equation. This calculation has not been validated in all clinical situations. eGFR's persistently <60 mL/min signify possible Chronic Kidney Disease.   Georgiann Hahn gap 03/14/2016 5  5 - 15 Final    Assessment:  Rose Irwin is a 82 y.o. female with a history of stage IB right breast cancer (2013) and thyroid cancer (1999).  She presented with neck fullness.  She underwent thyrodectomy and I-131.  She has a substernal goiter.  Chest CT on 08/01/2011 revealed a 3.8 x 3.9 x 5.7 cm mass in the superior mediastinum lying posterior to the trachea and displacing the esophagus toward the right along its upper portion.  More distally the thoracic esophagus appears normal. Decision was made for observation  She was diagnosed with with right breast cancer in 10/2011.  Pathology revealed a T1bN0M0 tumor which was ER positive, PR posiitive and her2/neu negative.  She underwent lumpectomy followed by radiation.  She began Arimidex after radiation.  She discontinued Arimidex this year.    Mammogram on 11/17/2015 revealed no evidence of malignancy. CA27.29 was 35.6 on 05/04/2015 and 33.4 on 11/02/2015.  She has a history of esophageal dilatation x 2 (last 15 years ago).  Chest CT on 11/09/2015 revealed a 3.7 x 4.0 mediastinal mass posterior to the mid trachea which was stable in size from 08/01/2011.  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 It exerted significant mass effect on the upper esophagus.    Barium swallow on 11/21/2015 revealed deviation of the cervical esophagus to the right from previously identified paratracheal mass lesion.  There were no motility issues with her esophagus.   I-131 scan with Thyrogen on 12/03/2015 revealed a large focus of intense radiotracer uptake within the superior mediastinum corresponding to the mediastinal mass identified on chest CT and compatible with residual functioning thyroid tissue (recurrent thyroid cancer or normal  thyroid tissue).  She is not a surgical candidate.  Symptomatically,  she continues to struggle with swallowing issues.  She is slowly losing weight.  Plan: 1.  Labs today:  CBC with diff, CMP, CA27.29 2.  Reschedule appt with Dr. Allen Norris. 3   RTC in 3 months for MD assessment +/- labs.   Lequita Asal, MD  03/14/2016, 11:45 AM

## 2016-03-14 NOTE — Progress Notes (Signed)
[facility] day:  01/11/2016  Chief Complaint: Rose Irwin is a 82 y.o. female with a history of thyroid carcinoma and breast cancer who is seen for assessment after interval surgical evaluation.  HPI: The patient was last seen in the medical oncology clinic on 12/14/2015.  At that time, I-131 scan was reviewed.  Scans revealed a large focus of intense radiotracer uptake within the superior mediastinum corresponding to the mediastinal mass identified on chest CT and compatible with residual functioning thyroid tissue (recurrent thyroid cancer or normal thyroid tissue). There was no evidence of other disease.  She had seen surgery in the past as well as sought out opinions from [facility]  She was concerned about surgery.  She was willing to consider a biopsy.  She was presented at tumor board.  She was seen by Dr. Genevive Bi on 12/23/2015.  She described difficulty swallowing meat products and large pills.  Liquids and softer foods went down fine. She had lost 3 pounds in the past 6 months. She had chronic hoarseness.  Her oxygen saturations were in the upper 80s today and on recheck were 89-90% after rest.  Review of scans revealed no esophageal compression on the upper GI series.  She was not felt to be a surgical candidate.  She was referred back to GI, Dr. Allen Norris.  Upper endoscopy with Dr. Allen Norris was cancelled on 12/29/2015.  Symptomatically, she stats that she has 1 good meal a day.  She is eating about 2000 calories a day.  She notes that she chooses the foods that she can eat.  Her oxygen saturations are "always low".   Past Medical History:  Diagnosis Date  . Anxiety   . Arthritis   . Breast cancer ([place]) 11/21/2011   right breast cancer - radiation  . Bronchitis   . Cancer [facility])    breast, thyroid  . Complication of anesthesia    smothering  . COPD (chronic obstructive pulmonary disease) ([place])   . Depression   . Elevated lipids   . GERD  (gastroesophageal reflux disease)   . Hemorrhoids   . Hypertension   . Hypothyroidism   . Neuropathy ([facility])    rt leg  . Osteopenia   . Skin cancer     Past Surgical History:  Procedure Laterality Date  . ABDOMINAL HYSTERECTOMY    . BACK SURGERY     lumbar  . bladder polyps    . BREAST SURGERY Right   . CYSTOSCOPY W/ RETROGRADES Bilateral 02/09/2015   Procedure: CYSTOSCOPY WITH RETROGRADE PYELOGRAM;  Surgeon: Hollice Espy, MD;  Location: [facility] ORS;  Service: Urology;  Laterality: Bilateral;  . LUNG SURGERY    . THYROID SURGERY      Family History  Problem Relation Age of Onset  . Breast cancer Mother   . Breast cancer Sister   . Breast cancer Sister     Social History:  reports that she has been smoking Cigarettes.  She has a 210.00 pack-year smoking history. She does not have any smokeless tobacco history on file. She reports that she does not drink alcohol or use drugs.  She is smoking 1/4 pack a day. The patient is alone today.  Allergies:  Allergies  Allergen Reactions  . Codeine Other (See Comments)    Go limp, sweat  . Sulfa Antibiotics Swelling    tongue  . Aspirin Palpitations    Current Medications: Current Outpatient Prescriptions  Medication Sig Dispense Refill  .  albuterol-ipratropium (COMBIVENT) 18-103 MCG/ACT inhaler Inhale 1 puff into the lungs every 4 (four) hours as needed for wheezing or shortness of breath.    . ALPRAZolam (XANAX) 0.5 MG tablet TAKE ONE TABLET TWICE DAILY AS NEEDED FOR ANXIETY 60 tablet 0  . aspirin EC 81 MG tablet Take 81 mg by mouth daily.    . COMBIVENT RESPIMAT 20-100 MCG/ACT AERS respimat Inhale 1 puff into the lungs 4 (four) times daily.  6  . losartan-hydrochlorothiazide (HYZAAR) 50-12.5 MG tablet Take by mouth.    . metoprolol (LOPRESSOR) 50 MG tablet Take 50 mg by mouth 2 (two) times daily.  3  . metoprolol succinate (TOPROL-XL) 50 MG 24 hr tablet Take 50 mg by mouth 2 (two) times daily. Take with or immediately following  a meal.    . PARoxetine (PAXIL) 20 MG tablet Take 20 mg by mouth daily.    . simvastatin (ZOCOR) 40 MG tablet Take 40 mg by mouth daily at 6 PM.    . SYNTHROID 100 MCG tablet TAKE ONE (1) TABLET BY MOUTH EVERY DAY 30 tablet 0  . triamcinolone ointment (KENALOG) 0.1 % Reported on 01/11/2016     No current facility-administered medications for this visit.     Review of Systems:  GENERAL:  Feels "ok". No fevers or sweats.  Weight down 1 pound. PERFORMANCE STATUS (ECOG):  1 HEENT:  No visual changes, runny nose, sore throat, mouth sores or tenderness. Lungs: No shortness of breath or cough.  No hemoptysis. Cardiac:  No chest pain, palpitations, orthopnea, or PND. GI:  Chooses foods that she can eat.  Weekly choking spells.  No nausea, vomiting, diarrhea, constipation, melena or hematochezia. GU:  Overactive bladder.  No urgency, frequency, dysuria, or hematuria. Musculoskeletal:   Chronic back pain (h/o back surgery; DDD) seeing orthopedics.  No joint pain.  No muscle tenderness. Extremities:  No pain or swelling. Skin:  No rashes or skin changes. Neuro:  Sciatic nerve issues (shooting pain down right leg).  Right leg gives away.  No headache, numbness or weakness, balance or coordination issues. Endocrine:  No diabetes.  h/o thyroid cancer.  No hot flashes or night sweats. Psych:  No mood changes, depression or anxiety. Pain:  No focal pain. Review of systems:  All other systems reviewed and found to be negative.  Physical Exam: Blood pressure (!) 160/67, pulse 60, temperature (!) 95.8 F (35.4 C), temperature source Tympanic, resp. rate 18, height '5\' 6"'  (1.676 m), weight 137 lb 7.3 oz (62.4 kg). GENERAL:  Thin elderly woman sitting comfortably in the exam room in no acute distress.  She has a cane at her side. MENTAL STATUS:  Alert and oriented to person, place and time. HEAD:  Short styled gray hair.  Normocephalic, atraumatic, face symmetric, no Cushingoid features. EYES:  Brown eyes.   Pupils equal round and reactive to light and accomodation.  No conjunctivitis or scleral icterus. ENT:  Oropharynx clear without lesion.  Tongue normal. Mucous membranes moist.  RESPIRATORY:  Clear to auscultation without rales, wheezes or rhonchi. CARDIOVASCULAR:  Regular rate and rhythm without murmur, rub or gallop. ABDOMEN:  Soft, non-tender, with active bowel sounds, and no hepatosplenomegaly.  No masses. SKIN:  No rashes, ulcers or lesions. EXTREMITIES: No edema, no skin discoloration or tenderness.  No palpable cords. LYMPH NODES: No palpable cervical, supraclavicular, axillary or inguinal adenopathy  NEUROLOGICAL: Unremarkable. PSYCH:  Appropriate.   No visits with results within 3 Day(s) from this visit.  Latest known visit with  results is:  Office Visit on 11/02/2015  Component Date Value Ref Range Status  . Free T4 11/03/2015 1.23* 0.61 - 1.12 ng/dL Final    Assessment:  Rose Irwin is a 82 y.o. female with a history of stage IB right breast cancer (2013) and thyroid cancer (1999).  She presented with neck fullness.  She underwent thyrodectomy and I-131.  She has a substernal goiter.  Chest CT on 08/01/2011 revealed a 3.8 x 3.9 x 5.7 cm mass in the superior mediastinum lying posterior to the trachea and displacing the esophagus toward the right along its upper portion.  More distally the thoracic esophagus appears normal. Decision was made for observation  She was diagnosed with with right breast cancer in 10/2011.  Pathology revealed a T1bN0M0 tumor which was ER positive, PR posiitive and her2/neu negative.  She underwent lumpectomy followed by radiation.  She began Arimidex after radiation.  She discontinued Arimidex this year.    Mammogram on 11/17/2015 revealed no evidence of malignancy. CA27.29 was 35.6 on 05/04/2015 and 33.4 on 11/02/2015.  She has a history of esophageal dilatation x 2 (last 15 years ago).  Chest CT on 11/09/2015 revealed a 3.7 x 4.0 mediastinal mass  posterior to the mid trachea which was stable in size from 08/01/2011.  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 It exerted significant mass effect on the upper esophagus.    Barium swallow on 11/21/2015 revealed deviation of the cervical esophagus to the right from previously identified paratracheal mass lesion.  There were no motility issues with her esophagus.   I-131 scan with Thyrogen on 12/03/2015 revealed a large focus of intense radiotracer uptake within the superior mediastinum corresponding to the mediastinal mass identified on chest CT and compatible with residual functioning thyroid tissue (recurrent thyroid cancer or normal thyroid tissue).  Symptomatically, she is able to eat 1 good meal a day.  Caloric intake is good.  She has lost 3 pounds in the past 6 month.  She has chronic low oxygen saturations.  Plan: 1.  Review consult with Dr. Genevive Bi. 2.  Consult Dr Allen Norris: dysphagia  3.  RTC 2 months for MD assessment  + breast exam and labs (CBC with diff, CMP, CA27.29)   Lequita Asal, MD  01/11/2016

## 2016-03-15 LAB — CANCER ANTIGEN 27.29: CA 27.29: 30.2 U/mL (ref 0.0–38.6)

## 2016-03-18 ENCOUNTER — Encounter: Payer: Self-pay | Admitting: Hematology and Oncology

## 2016-03-19 ENCOUNTER — Other Ambulatory Visit: Payer: Self-pay

## 2016-03-19 NOTE — Telephone Encounter (Signed)
Dysphagia R13.10 [facility] [id number] Please pre cert

## 2016-03-19 NOTE — Telephone Encounter (Signed)
Gastroenterology Pre-Procedure Review  Request Date: 05/08/2016 Requesting Physician: Dr. Lovie Macadamia  PATIENT REVIEW QUESTIONS: The patient responded to the following health history questions as indicated:    1. Are you having any GI issues? no 2. Do you have a personal history of Polyps? yes (benign) 3. Do you have a family history of Colon Cancer or Polyps? yes (brother colon cancer) 4. Diabetes Mellitus? no 5. Joint replacements in the past 12 months?no 6. Major health problems in the past 3 months?no 7. Any artificial heart valves, MVP, or defibrillator?no    MEDICATIONS & ALLERGIES:    Patient reports the following regarding taking any anticoagulation/antiplatelet therapy:   Plavix, Coumadin, Eliquis, Xarelto, Lovenox, Pradaxa, Brilinta, or Effient? no Aspirin? yes (heart health)  Patient confirms/reports the following medications:  Current Outpatient Prescriptions  Medication Sig Dispense Refill  . albuterol-ipratropium (COMBIVENT) 18-103 MCG/ACT inhaler Inhale 1 puff into the lungs every 4 (four) hours as needed for wheezing or shortness of breath.    . ALPRAZolam (XANAX) 0.5 MG tablet TAKE ONE TABLET TWICE DAILY AS NEEDED FOR ANXIETY 60 tablet 0  . aspirin EC 81 MG tablet Take 81 mg by mouth daily.    . COMBIVENT RESPIMAT 20-100 MCG/ACT AERS respimat Inhale 1 puff into the lungs 4 (four) times daily.  6  . Fluticasone-Salmeterol (ADVAIR DISKUS) 250-50 MCG/DOSE AEPB Inhale into the lungs.    Marland Kitchen levothyroxine (SYNTHROID, LEVOTHROID) 88 MCG tablet Take 88 mcg by mouth daily before breakfast.     . losartan-hydrochlorothiazide (HYZAAR) 50-12.5 MG tablet Take by mouth.    . metoprolol succinate (TOPROL-XL) 50 MG 24 hr tablet Take 50 mg by mouth 2 (two) times daily. Take with or immediately following a meal.    . PARoxetine (PAXIL) 20 MG tablet Take 20 mg by mouth daily.    . simvastatin (ZOCOR) 20 MG tablet Take by mouth.    . triamcinolone ointment (KENALOG) 0.1 % Reported on  01/11/2016     No current facility-administered medications for this visit.     Patient confirms/reports the following allergies:  Allergies  Allergen Reactions  . Codeine Other (See Comments)    Go limp, sweat  . Sulfa Antibiotics Swelling    tongue  . Aspirin Palpitations    No orders of the defined types were placed in this encounter.   AUTHORIZATION INFORMATION Primary Insurance: 1D#: Group #:  Secondary Insurance: 1D#: Group #:  SCHEDULE INFORMATION: Date: 05/08/2016 Time: Location: [facility]

## 2016-03-22 NOTE — Telephone Encounter (Signed)
Auth provided for EGD.

## 2016-05-01 ENCOUNTER — Telehealth: Payer: Self-pay

## 2016-05-01 NOTE — Telephone Encounter (Signed)
Patient called office at this time to cancel Endoscopy on 05/08/16 with Dr.Wohl. She stated her husband is very ill and her funds are limited. [facility] department notified of cancellation.

## 2016-05-01 NOTE — Telephone Encounter (Signed)
Noted. Cancelled with [facility] Endo.

## 2016-05-01 NOTE — Telephone Encounter (Signed)
-----   Message from Celene Kras, [place] sent at 05/01/2016  2:13 PM EDT ----- Regarding: Endoscopy Cancellation Patient called office today and cancelled Endoscopy on 05/08/16 due to husband being very ill and funds are limited.

## 2016-05-08 ENCOUNTER — Ambulatory Visit: Admit: 2016-05-08 | Payer: Medicare Other | Admitting: Gastroenterology

## 2016-05-08 SURGERY — ESOPHAGOGASTRODUODENOSCOPY (EGD) WITH PROPOFOL
Anesthesia: General

## 2016-05-09 ENCOUNTER — Ambulatory Visit: Payer: Medicare Other

## 2016-05-09 ENCOUNTER — Encounter (INDEPENDENT_AMBULATORY_CARE_PROVIDER_SITE_OTHER): Payer: Self-pay

## 2016-05-10 ENCOUNTER — Ambulatory Visit: Payer: Medicare Other

## 2016-05-11 ENCOUNTER — Ambulatory Visit: Payer: Medicare Other | Admitting: Pharmacy Technician

## 2016-05-11 ENCOUNTER — Encounter (INDEPENDENT_AMBULATORY_CARE_PROVIDER_SITE_OTHER): Payer: Self-pay

## 2016-05-11 DIAGNOSIS — Z79899 Other long term (current) drug therapy: Secondary | ICD-10-CM

## 2016-05-11 NOTE — Progress Notes (Signed)
  Completed [facility] application and contract.  Patient agreed to all terms of the [facility] contract.  Patient to provide EOB, utility bill and checking account statement.  Patient is in the Medicare Part D coverage gap.  Verbally verified by [place].   During appointment patient indicated that her spouse has stage IV cancer and she is having a hard time dealing with it.  Also, stated that she is having difficulty providing in-home care for him.  Provided patient with information about grief counseling through Hospice and in-home care information about [facility] and Home Care Providers.  Also, provided patient with additional community resource material based on her particular needs.    Patient is to contact Dr. Reuel Boom office and request that they electronically send prescriptions to [facility].  Patient made aware and understood that [facility] does not fill or dispense controlled or narcotic medications.  Completed PAP applications for Combivent Respimat & Advair.  Patient understood that [facility] needs EOB before medication assistance can be provided for these medications.  [facility]

## 2016-06-13 ENCOUNTER — Ambulatory Visit: Payer: Medicare Other | Admitting: Hematology and Oncology

## 2016-06-13 ENCOUNTER — Other Ambulatory Visit: Payer: Medicare Other

## 2016-06-27 ENCOUNTER — Ambulatory Visit: Payer: Medicare Other | Admitting: Hematology and Oncology

## 2016-06-27 ENCOUNTER — Other Ambulatory Visit: Payer: Medicare Other

## 2016-07-03 NOTE — Progress Notes (Signed)
[facility] day:  07/04/16   Chief Complaint: Rose Irwin is a 82 y.o. female with a history of thyroid carcinoma and breast cancer who is seen for 3 month assessment.  HPI: The patient was last seen in the medical oncology clinic on 03/14/2016.  At that time, she had persistent swallowing difficulties.  She was slowly losing weight.  Her appointment with Dr. Allen Norris was rescheduled.  During the interim, she cancelled her appointment with Dr. Allen Norris.  She describes choking spells once in awhile.  She tries to eat.  She denies any nausea or vomiting.   Past Medical History:  Diagnosis Date  . Anxiety   . Arthritis   . Breast cancer ([place]) 11/21/2011   right breast cancer - radiation  . Bronchitis   . Cancer [facility])    breast, thyroid  . Complication of anesthesia    smothering  . COPD (chronic obstructive pulmonary disease) ([place])   . Depression   . Elevated lipids   . GERD (gastroesophageal reflux disease)   . Hemorrhoids   . Hypertension   . Hypothyroidism   . Neuropathy ([facility])    rt leg  . Osteopenia   . Skin cancer     Past Surgical History:  Procedure Laterality Date  . ABDOMINAL HYSTERECTOMY    . BACK SURGERY     lumbar  . bladder polyps    . BREAST SURGERY Right   . CYSTOSCOPY W/ RETROGRADES Bilateral 02/09/2015   Procedure: CYSTOSCOPY WITH RETROGRADE PYELOGRAM;  Surgeon: Hollice Espy, MD;  Location: [facility] ORS;  Service: Urology;  Laterality: Bilateral;  . LUNG SURGERY    . THYROID SURGERY      Family History  Problem Relation Age of Onset  . Breast cancer Mother   . Breast cancer Sister   . Breast cancer Sister     Social History:  reports that she has been smoking Cigarettes.  She has a 210.00 pack-year smoking history. She does not have any smokeless tobacco history on file. She reports that she does not drink alcohol or use drugs.  She is smoking 1/4 pack a day. The patient is alone today.  Allergies:  Allergies   Allergen Reactions  . Codeine Other (See Comments)    Go limp, sweat  . Sulfa Antibiotics Swelling    tongue  . Aspirin Palpitations    Current Medications: Current Outpatient Prescriptions  Medication Sig Dispense Refill  . albuterol-ipratropium (COMBIVENT) 18-103 MCG/ACT inhaler Inhale 1 puff into the lungs every 4 (four) hours as needed for wheezing or shortness of breath.    . ALPRAZolam (XANAX) 0.5 MG tablet TAKE ONE TABLET TWICE DAILY AS NEEDED FOR ANXIETY 60 tablet 0  . aspirin EC 81 MG tablet Take 81 mg by mouth daily.    . COMBIVENT RESPIMAT 20-100 MCG/ACT AERS respimat Inhale 1 puff into the lungs 4 (four) times daily.  6  . Fluticasone-Salmeterol (ADVAIR DISKUS) 250-50 MCG/DOSE AEPB Inhale into the lungs.    Marland Kitchen levothyroxine (SYNTHROID, LEVOTHROID) 88 MCG tablet Take 88 mcg by mouth daily before breakfast.     . losartan-hydrochlorothiazide (HYZAAR) 50-12.5 MG tablet Take by mouth.    . metoprolol succinate (TOPROL-XL) 50 MG 24 hr tablet Take 50 mg by mouth 2 (two) times daily. Take with or immediately following a meal.    . PARoxetine (PAXIL) 20 MG tablet Take 20 mg by mouth daily.    . simvastatin (ZOCOR) 20 MG tablet Take by  mouth.    . triamcinolone ointment (KENALOG) 0.1 % Reported on 01/11/2016     No current facility-administered medications for this visit.     Review of Systems:  GENERAL:  Feels "good".  No fevers or sweats.  Weight gain of 1 pound. PERFORMANCE STATUS (ECOG):  1 HEENT:  No visual changes, runny nose, sore throat, mouth sores or tenderness. Lungs: No shortness of breath or cough.  No hemoptysis. Cardiac:  No chest pain, palpitations, orthopnea, or PND. GI:  Choking spells once in awhile.  No nausea, vomiting, diarrhea, constipation, melena or hematochezia. GU:  Overactive bladder.  No urgency, frequency, dysuria, or hematuria. Musculoskeletal:   Chronic back pain (h/o back surgery; DDD) seeing orthopedics.  No joint pain.  No muscle  tenderness. Extremities:  No pain or swelling. Skin:  No rashes or skin changes. Neuro:  Sciatic nerve issues (shooting pain down right leg).  Right leg gives away.  No headache, numbness or weakness, balance or coordination issues. Endocrine:  No diabetes.  h/o thyroid cancer.  No hot flashes or night sweats. Psych:  No mood changes, depression or anxiety. Pain:  No focal pain. Review of systems:  All other systems reviewed and found to be negative.  Physical Exam: Blood pressure (!) 143/65, pulse (!) 53, temperature (!) 95.4 F (35.2 C), temperature source Tympanic, resp. rate 18, weight 138 lb 0.1 oz (62.6 kg). GENERAL:  Thin elderly woman sitting comfortably in the exam room in no acute distress.  She has a cane at her side. MENTAL STATUS:  Alert and oriented to person, place and time. HEAD:  Short styled gray hair.  Normocephalic, atraumatic, face symmetric, no Cushingoid features. EYES:  Brown eyes.  Pupils equal round and reactive to light and accomodation.  No conjunctivitis or scleral icterus. ENT:  Oropharynx clear without lesion.  Tongue normal. Mucous membranes moist.  RESPIRATORY:  Clear to auscultation without rales, wheezes or rhonchi. CARDIOVASCULAR:  Regular rate and rhythm without murmur, rub or gallop. ABDOMEN:  Soft, non-tender, with active bowel sounds, and no hepatosplenomegaly.  No masses. SKIN:  No rashes, ulcers or lesions. EXTREMITIES: No edema, no skin discoloration or tenderness.  No palpable cords. LYMPH NODES: No palpable cervical, supraclavicular, axillary or inguinal adenopathy  NEUROLOGICAL: Unremarkable. PSYCH:  Appropriate   Appointment on 07/04/2016  Component Date Value Ref Range Status  . WBC 07/04/2016 6.3  3.6 - 11.0 K/uL Final  . RBC 07/04/2016 5.34* 3.80 - 5.20 MIL/uL Final  . Hemoglobin 07/04/2016 15.1  12.0 - 16.0 g/dL Final  . HCT 07/04/2016 45.3  35.0 - 47.0 % Final  . MCV 07/04/2016 84.8  80.0 - 100.0 fL Final  . MCH 07/04/2016 28.2   26.0 - 34.0 pg Final  . MCHC 07/04/2016 33.2  32.0 - 36.0 g/dL Final  . RDW 07/04/2016 13.1  11.5 - 14.5 % Final  . Platelets 07/04/2016 219  150 - 440 K/uL Final  . Neutrophils Relative % 07/04/2016 69  % Final  . Neutro Abs 07/04/2016 4.4  1.4 - 6.5 K/uL Final  . Lymphocytes Relative 07/04/2016 19  % Final  . Lymphs Abs 07/04/2016 1.2  1.0 - 3.6 K/uL Final  . Monocytes Relative 07/04/2016 9  % Final  . Monocytes Absolute 07/04/2016 0.6  0.2 - 0.9 K/uL Final  . Eosinophils Relative 07/04/2016 2  % Final  . Eosinophils Absolute 07/04/2016 0.1  0 - 0.7 K/uL Final  . Basophils Relative 07/04/2016 1  % Final  . Basophils Absolute  07/04/2016 0.0  0 - 0.1 K/uL Final  . Sodium 07/04/2016 135  135 - 145 mmol/L Final  . Potassium 07/04/2016 3.6  3.5 - 5.1 mmol/L Final  . Chloride 07/04/2016 94* 101 - 111 mmol/L Final  . CO2 07/04/2016 34* 22 - 32 mmol/L Final  . Glucose, Bld 07/04/2016 119* 65 - 99 mg/dL Final  . BUN 07/04/2016 14  6 - 20 mg/dL Final  . Creatinine, Ser 07/04/2016 0.79  0.44 - 1.00 mg/dL Final  . Calcium 07/04/2016 9.3  8.9 - 10.3 mg/dL Final  . Total Protein 07/04/2016 6.9  6.5 - 8.1 g/dL Final  . Albumin 07/04/2016 4.1  3.5 - 5.0 g/dL Final  . AST 07/04/2016 21  15 - 41 U/L Final  . ALT 07/04/2016 14  14 - 54 U/L Final  . Alkaline Phosphatase 07/04/2016 75  38 - 126 U/L Final  . Total Bilirubin 07/04/2016 0.8  0.3 - 1.2 mg/dL Final  . GFR calc non Af Amer 07/04/2016 >60  >60 mL/min Final  . GFR calc Af Amer 07/04/2016 >60  >60 mL/min Final   Comment: (NOTE) The eGFR has been calculated using the CKD EPI equation. This calculation has not been validated in all clinical situations. eGFR's persistently <60 mL/min signify possible Chronic Kidney Disease.   Rose Irwin gap 07/04/2016 7  5 - 15 Final    Assessment:  ARDENE REMLEY is a 82 y.o. female with a history of stage IB right breast cancer (2013) and thyroid cancer (1999).  She presented with neck fullness.  She  underwent thyrodectomy and I-131.  She has a substernal goiter.  Chest CT on 08/01/2011 revealed a 3.8 x 3.9 x 5.7 cm mass in the superior mediastinum lying posterior to the trachea and displacing the esophagus toward the right along its upper portion.  More distally the thoracic esophagus appears normal. Decision was made for observation  She was diagnosed with with right breast cancer in 10/2011.  Pathology revealed a T1bN0M0 tumor which was ER positive, PR posiitive and her2/neu negative.  She underwent lumpectomy followed by radiation.  She began Arimidex after radiation.  She discontinued Arimidex in 2016.  She declined further hormonal therapy.    Mammogram on 11/17/2015 revealed no evidence of malignancy. CA27.29 was 35.6 on 05/04/2015, 33.4 on 11/02/2015, and 30.2 on 03/14/2016.  She has a history of esophageal dilatation x 2 (last 15 years ago).  Chest CT on 11/09/2015 revealed a 3.7 x 4.0 mediastinal mass posterior to the mid trachea which was stable in size from 08/01/2011.  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 It exerted significant mass effect on the upper esophagus.    Barium swallow on 11/21/2015 revealed deviation of the cervical esophagus to the right from previously identified paratracheal mass lesion.  There were no motility issues with her esophagus.   I-131 scan with Thyrogen on 12/03/2015 revealed a large focus of intense radiotracer uptake within the superior mediastinum corresponding to the mediastinal mass identified on chest CT and compatible with residual functioning thyroid tissue (recurrent thyroid cancer or normal thyroid tissue).  She is not a surgical candidate.  Symptomatically, she continues to struggle with swallowing issues.  She has gained a pound.  Plan: 1.  Labs today:  CBC with diff, CMP, TSH, free T4. 2.  Reschedule appt with Dr. Allen Norris. 3   RTC in 4 months for MD assessment and labs (CBC with diff, CMP,  CA27.29)   Lequita Asal,  MD  07/04/2016, 10:53 AM

## 2016-07-04 ENCOUNTER — Inpatient Hospital Stay (HOSPITAL_BASED_OUTPATIENT_CLINIC_OR_DEPARTMENT_OTHER): Payer: Medicare Other | Admitting: Hematology and Oncology

## 2016-07-04 ENCOUNTER — Inpatient Hospital Stay: Payer: Medicare Other | Attending: Hematology and Oncology

## 2016-07-04 ENCOUNTER — Telehealth: Payer: Self-pay | Admitting: Gastroenterology

## 2016-07-04 ENCOUNTER — Encounter: Payer: Self-pay | Admitting: Hematology and Oncology

## 2016-07-04 VITALS — BP 143/65 | HR 53 | Temp 95.4°F | Resp 18 | Wt 138.0 lb

## 2016-07-04 DIAGNOSIS — R634 Abnormal weight loss: Secondary | ICD-10-CM

## 2016-07-04 DIAGNOSIS — M1990 Unspecified osteoarthritis, unspecified site: Secondary | ICD-10-CM

## 2016-07-04 DIAGNOSIS — F17210 Nicotine dependence, cigarettes, uncomplicated: Secondary | ICD-10-CM | POA: Diagnosis not present

## 2016-07-04 DIAGNOSIS — M8580 Other specified disorders of bone density and structure, unspecified site: Secondary | ICD-10-CM | POA: Insufficient documentation

## 2016-07-04 DIAGNOSIS — Z85828 Personal history of other malignant neoplasm of skin: Secondary | ICD-10-CM | POA: Insufficient documentation

## 2016-07-04 DIAGNOSIS — R630 Anorexia: Secondary | ICD-10-CM

## 2016-07-04 DIAGNOSIS — Z7982 Long term (current) use of aspirin: Secondary | ICD-10-CM | POA: Diagnosis not present

## 2016-07-04 DIAGNOSIS — I10 Essential (primary) hypertension: Secondary | ICD-10-CM | POA: Diagnosis not present

## 2016-07-04 DIAGNOSIS — Z923 Personal history of irradiation: Secondary | ICD-10-CM | POA: Insufficient documentation

## 2016-07-04 DIAGNOSIS — C50911 Malignant neoplasm of unspecified site of right female breast: Secondary | ICD-10-CM

## 2016-07-04 DIAGNOSIS — Z853 Personal history of malignant neoplasm of breast: Secondary | ICD-10-CM | POA: Insufficient documentation

## 2016-07-04 DIAGNOSIS — E785 Hyperlipidemia, unspecified: Secondary | ICD-10-CM | POA: Diagnosis not present

## 2016-07-04 DIAGNOSIS — R1310 Dysphagia, unspecified: Secondary | ICD-10-CM | POA: Diagnosis not present

## 2016-07-04 DIAGNOSIS — N3281 Overactive bladder: Secondary | ICD-10-CM | POA: Insufficient documentation

## 2016-07-04 DIAGNOSIS — Z170 Estrogen receptor positive status [ER+]: Secondary | ICD-10-CM | POA: Insufficient documentation

## 2016-07-04 DIAGNOSIS — Z79899 Other long term (current) drug therapy: Secondary | ICD-10-CM

## 2016-07-04 DIAGNOSIS — E039 Hypothyroidism, unspecified: Secondary | ICD-10-CM | POA: Diagnosis not present

## 2016-07-04 DIAGNOSIS — Z9223 Personal history of estrogen therapy: Secondary | ICD-10-CM | POA: Diagnosis not present

## 2016-07-04 DIAGNOSIS — C73 Malignant neoplasm of thyroid gland: Secondary | ICD-10-CM

## 2016-07-04 DIAGNOSIS — Z85850 Personal history of malignant neoplasm of thyroid: Secondary | ICD-10-CM

## 2016-07-04 DIAGNOSIS — M5431 Sciatica, right side: Secondary | ICD-10-CM | POA: Insufficient documentation

## 2016-07-04 DIAGNOSIS — K219 Gastro-esophageal reflux disease without esophagitis: Secondary | ICD-10-CM | POA: Insufficient documentation

## 2016-07-04 DIAGNOSIS — K228 Other specified diseases of esophagus: Secondary | ICD-10-CM | POA: Insufficient documentation

## 2016-07-04 LAB — COMPREHENSIVE METABOLIC PANEL
ALT: 14 U/L (ref 14–54)
AST: 21 U/L (ref 15–41)
Albumin: 4.1 g/dL (ref 3.5–5.0)
Alkaline Phosphatase: 75 U/L (ref 38–126)
Anion gap: 7 (ref 5–15)
BUN: 14 mg/dL (ref 6–20)
CO2: 34 mmol/L — ABNORMAL HIGH (ref 22–32)
Calcium: 9.3 mg/dL (ref 8.9–10.3)
Chloride: 94 mmol/L — ABNORMAL LOW (ref 101–111)
Creatinine, Ser: 0.79 mg/dL (ref 0.44–1.00)
GFR calc Af Amer: >60 mL/min (ref >60)
GFR calc non Af Amer: >60 mL/min (ref >60)
Glucose, Bld: 119 mg/dL — ABNORMAL HIGH (ref 65–99)
Potassium: 3.6 mmol/L (ref 3.5–5.1)
Sodium: 135 mmol/L (ref 135–145)
Total Bilirubin: 0.8 mg/dL (ref 0.3–1.2)
Total Protein: 6.9 g/dL (ref 6.5–8.1)

## 2016-07-04 LAB — CBC WITH DIFFERENTIAL/PLATELET
Basophils Absolute: 0 10*3/uL (ref 0–0.1)
Basophils Relative: 1 %
Eosinophils Absolute: 0.1 10*3/uL (ref 0–0.7)
Eosinophils Relative: 2 %
HCT: 45.3 % (ref 35.0–47.0)
Hemoglobin: 15.1 g/dL (ref 12.0–16.0)
Lymphocytes Relative: 19 %
Lymphs Abs: 1.2 10*3/uL (ref 1.0–3.6)
MCH: 28.2 pg (ref 26.0–34.0)
MCHC: 33.2 g/dL (ref 32.0–36.0)
MCV: 84.8 fL (ref 80.0–100.0)
Monocytes Absolute: 0.6 10*3/uL (ref 0.2–0.9)
Monocytes Relative: 9 %
Neutro Abs: 4.4 10*3/uL (ref 1.4–6.5)
Neutrophils Relative %: 69 %
Platelets: 219 10*3/uL (ref 150–440)
RBC: 5.34 MIL/uL — ABNORMAL HIGH (ref 3.80–5.20)
RDW: 13.1 % (ref 11.5–14.5)
WBC: 6.3 10*3/uL (ref 3.6–11.0)

## 2016-07-04 LAB — TSH: TSH: 0.955 u[IU]/mL (ref 0.350–4.500)

## 2016-07-04 LAB — T4, FREE: Free T4: 1.19 ng/dL — ABNORMAL HIGH (ref 0.61–1.12)

## 2016-07-04 NOTE — Telephone Encounter (Signed)
Spoke with pt and she has advised me she will call back on Friday afternoon and let me know when she wants to schedule appt. Husband has an appt on Friday at 3:00 and they will be making a decision about surgery for him.

## 2016-07-04 NOTE — Telephone Encounter (Signed)
Dr Kem Parkinson nurse has called and stated that the patient would like to schedule her EGD at this time. This was previously scheduled however patient canceled due to her husband being sick.   Dr Mike Gip would like for [place] to call the patient to have this scheduled.

## 2016-07-04 NOTE — Progress Notes (Signed)
Patient offers no complaints today.  No new diagnosis since last visit. 

## 2016-07-04 NOTE — Telephone Encounter (Signed)
LVM for pt to return my call to reschedule EGD.

## 2016-07-04 NOTE — Telephone Encounter (Signed)
Dr. Dorothey Baseman office called for patient to reschedule patients appointment for endo and with Dr.Wohl.  They will call patient with appointment this week.

## 2016-07-06 ENCOUNTER — Telehealth: Payer: Self-pay | Admitting: *Deleted

## 2016-07-06 NOTE — Telephone Encounter (Addendum)
Called patient to ask if PCP or endocrinologist is handling thyroid medication.  Per patient, Dr. Oliva Bustard was managing it and Dr. Mike Gip changed the synthroid from 100 mcg to 88 mcg once she took over patient's care.

## 2016-07-06 NOTE — Telephone Encounter (Signed)
-----   Message from Lequita Asal, MD sent at 07/06/2016  1:44 PM EST ----- Regarding: thyroid function tests  I am unsure if her PCP or endocrinology managing these.  M  ----- Message ----- From: Interface, Lab In [place] Sent: 07/04/2016  10:12 AM To: Lequita Asal, MD

## 2016-07-09 ENCOUNTER — Other Ambulatory Visit: Payer: Self-pay

## 2016-07-09 NOTE — Telephone Encounter (Signed)
Pt rescheduled her EGD to 08/02/16 at [facility] with Dr. Allen Norris.

## 2016-07-09 NOTE — Telephone Encounter (Signed)
I have spoke with patient. She states that her husband is having surgery on 07/27/16, he has stage 4 cancer. She would like to know if she can wait and schedule her EGD after 07/27/16. Please call patient and schedule.

## 2016-07-09 NOTE — Telephone Encounter (Signed)
LVM for pt to return my call to schedule EGD after 07/27/16 per pt request.

## 2016-07-10 ENCOUNTER — Other Ambulatory Visit: Payer: Self-pay | Admitting: *Deleted

## 2016-07-10 ENCOUNTER — Telehealth: Payer: Self-pay | Admitting: Hematology and Oncology

## 2016-07-10 MED ORDER — LEVOTHYROXINE SODIUM 88 MCG PO TABS: 88.0000 ug | ORAL_TABLET | Freq: Every day | ORAL | 1 refills | Status: DC

## 2016-07-10 NOTE — Telephone Encounter (Signed)
Called and spoke to pt and she is on 88 mcg and she was called yest. By Rodena Piety to ask if she has a endocrinologist and she does not have one.  choksi had always managed her thyroid med. I tried Dr. Mike Gip and there is no answer and she is off work today . I spoke to on call md and he said based on the results of her thyroid tests she should stay on 88 mcg.  I have called pt and let her know and I am sending electronic rx refill

## 2016-07-10 NOTE — Telephone Encounter (Signed)
She only one has synthroid 88mg  left and someone called and told her they are going to raise her med levels so she wants you to know that you will need to call in another Rx since she only has one left. Thanks.

## 2016-07-24 ENCOUNTER — Other Ambulatory Visit: Payer: Self-pay | Admitting: Urology

## 2016-07-24 ENCOUNTER — Telehealth: Payer: Self-pay | Admitting: Gastroenterology

## 2016-07-24 NOTE — Telephone Encounter (Signed)
Patient needs to cancel her colonoscopy on 1/11. Her husband is in hospice and she will call us back to reschedule

## 2016-07-24 NOTE — Telephone Encounter (Signed)
Contacted [facility] regarding cancelling pt's procedure.

## 2016-07-25 ENCOUNTER — Telehealth: Payer: Self-pay | Admitting: Pharmacist

## 2016-07-25 NOTE — Telephone Encounter (Signed)
Advair 250/50 refill PAP submitted to manufacturer today.

## 2016-08-02 ENCOUNTER — Ambulatory Visit: Admit: 2016-08-02 | Payer: Medicare Other | Admitting: Gastroenterology

## 2016-08-02 SURGERY — ESOPHAGOGASTRODUODENOSCOPY (EGD) WITH PROPOFOL
Anesthesia: Choice

## 2016-08-27 ENCOUNTER — Other Ambulatory Visit: Payer: Self-pay | Admitting: Urology

## 2016-09-17 ENCOUNTER — Other Ambulatory Visit: Payer: Self-pay | Admitting: Urology

## 2016-09-21 ENCOUNTER — Encounter: Payer: Self-pay | Admitting: Hematology and Oncology

## 2016-10-08 ENCOUNTER — Other Ambulatory Visit: Payer: Self-pay | Admitting: *Deleted

## 2016-10-08 MED ORDER — LEVOTHYROXINE SODIUM 88 MCG PO TABS: 88.0000 ug | ORAL_TABLET | Freq: Every day | ORAL | 1 refills | Status: DC

## 2016-10-08 NOTE — Telephone Encounter (Signed)
  T4, free  Order: [id number]  Collected:  07/04/2016 09:59  Free T4 0.61 - 1.12 ng/dL 1.19      TSH  Order: [id number]  Collected:  07/04/2016 09:59   Ref Range & Units 67mo ago  TSH 0.350 - 4.500 uIU/mL 0.955

## 2016-10-15 ENCOUNTER — Other Ambulatory Visit: Payer: Self-pay | Admitting: Urology

## 2016-10-16 ENCOUNTER — Other Ambulatory Visit: Payer: Self-pay | Admitting: Urology

## 2016-10-30 ENCOUNTER — Ambulatory Visit
Admission: EM | Admit: 2016-10-30 | Discharge: 2016-10-30 | Disposition: A | Discharge status: Home / Self Care | Payer: Medicare Other | Attending: Family Medicine | Admitting: Family Medicine

## 2016-10-30 ENCOUNTER — Encounter: Payer: Self-pay | Admitting: *Deleted

## 2016-10-30 ENCOUNTER — Telehealth: Payer: Self-pay

## 2016-10-30 DIAGNOSIS — R05 Cough: Secondary | ICD-10-CM | POA: Diagnosis not present

## 2016-10-30 DIAGNOSIS — J441 Chronic obstructive pulmonary disease with (acute) exacerbation: Secondary | ICD-10-CM | POA: Diagnosis not present

## 2016-10-30 MED ORDER — PREDNISONE 20 MG PO TABS: ORAL_TABLET | ORAL | 0 refills | Status: DC

## 2016-10-30 MED ORDER — DOXYCYCLINE HYCLATE 100 MG PO TABS: 100.0000 mg | ORAL_TABLET | Freq: Two times a day (BID) | ORAL | 0 refills | Status: DC

## 2016-10-30 MED ORDER — IPRATROPIUM-ALBUTEROL 0.5-2.5 (3) MG/3ML IN SOLN
3.0000 mL | Freq: Once | RESPIRATORY_TRACT | Status: AC
  Administered 2016-10-30: 3 mL via RESPIRATORY_TRACT

## 2016-10-30 MED ORDER — ALBUTEROL SULFATE HFA 108 (90 BASE) MCG/ACT IN AERS: 2.0000 | INHALATION_SPRAY | RESPIRATORY_TRACT | 1 refills | Status: DC | PRN

## 2016-10-30 NOTE — ED Triage Notes (Signed)
Patient started having symptoms of cough and chest congestion 2 days ago. Patient has a history of bronchitis.

## 2016-10-30 NOTE — ED Provider Notes (Signed)
[facility]    CSN: [id number] Arrival date & time: 10/30/16  1037     History   Chief Complaint Chief Complaint  Patient presents with  . Cough    HPI Rose Irwin is a 83 y.o. female.   The history is provided by the patient.  URI  Presenting symptoms: congestion and cough   Severity:  Moderate Onset quality:  Sudden Duration:  4 days Timing:  Constant Progression:  Worsening Chronicity:  New Relieved by:  Nothing Ineffective treatments:  OTC medications Risk factors: being elderly and chronic respiratory disease (copd)   Risk factors: no chronic cardiac disease, no chronic kidney disease, no diabetes mellitus, no immunosuppression, no recent illness, no recent travel and no sick contacts     Past Medical History:  Diagnosis Date  . Anxiety   . Arthritis   . Breast cancer ([place]) 11/21/2011   right breast cancer - radiation  . Bronchitis   . Cancer [facility])    breast, thyroid  . Complication of anesthesia    smothering  . COPD (chronic obstructive pulmonary disease) ([place])   . Depression   . Elevated lipids   . GERD (gastroesophageal reflux disease)   . Hemorrhoids   . Hypertension   . Hypothyroidism   . Neuropathy ([facility])    rt leg  . Osteopenia   . Skin cancer     Patient Active Problem List   Diagnosis Date Noted  . Dysphagia 11/02/2015  . Skin cancer of nose 05/04/2015  . Esophageal stricture 05/04/2015  . Palpitations 01/18/2015  . Shortness of breath 01/17/2015  . COPD, mild ([place]) 01/03/2015  . Acquired hypothyroidism 10/28/2013  . Arthritis 10/28/2013  . Benign essential hypertension 10/28/2013  . GERD (gastroesophageal reflux disease) 10/28/2013  . History of hemorrhoids 10/28/2013  . History of nephrolithiasis 10/28/2013  . History of thyroid cancer 10/28/2013  . Keratoacanthoma 10/28/2013  . Major depression, chronic 10/28/2013  . Mixed hyperlipidemia 10/28/2013  . Osteopenia 10/28/2013  . Breast cancer ([place]) 10/22/2011  .  Thyroid cancer ([place]) 07/23/1997    Past Surgical History:  Procedure Laterality Date  . ABDOMINAL HYSTERECTOMY    . BACK SURGERY     lumbar  . bladder polyps    . BREAST SURGERY Right   . CYSTOSCOPY W/ RETROGRADES Bilateral 02/09/2015   Procedure: CYSTOSCOPY WITH RETROGRADE PYELOGRAM;  Surgeon: Hollice Espy, MD;  Location: [facility] ORS;  Service: Urology;  Laterality: Bilateral;  . LUNG SURGERY    . THYROID SURGERY      OB History    No data available       Home Medications    Prior to Admission medications   Medication Sig Start Date End Date Taking? Authorizing Provider  albuterol-ipratropium (COMBIVENT) 18-103 MCG/ACT inhaler Inhale 1 puff into the lungs every 4 (four) hours as needed for wheezing or shortness of breath.   Yes Historical Provider, MD  ALPRAZolam Duanne Moron) 0.5 MG tablet TAKE ONE TABLET TWICE DAILY AS NEEDED FOR ANXIETY 03/11/15  Yes Lequita Asal, MD  aspirin EC 81 MG tablet Take 81 mg by mouth daily.   Yes Historical Provider, MD  Fluticasone-Salmeterol (ADVAIR DISKUS) 250-50 MCG/DOSE AEPB Inhale into the lungs. 01/03/15  Yes Historical Provider, MD  levothyroxine (SYNTHROID, LEVOTHROID) 88 MCG tablet Take 1 tablet (88 mcg total) by mouth daily before breakfast. 10/08/16 10/08/17 Yes Lequita Asal, MD  losartan-hydrochlorothiazide (HYZAAR) 50-12.5 MG tablet Take by mouth. 11/18/15 11/17/16 Yes Historical Provider, MD  metoprolol succinate (TOPROL-XL)  50 MG 24 hr tablet Take 50 mg by mouth 2 (two) times daily. Take with or immediately following a meal.   Yes Historical Provider, MD  PARoxetine (PAXIL) 20 MG tablet Take 20 mg by mouth daily.   Yes Historical Provider, MD  COMBIVENT RESPIMAT 20-100 MCG/ACT AERS respimat Inhale 1 puff into the lungs 4 (four) times daily. 12/20/15   Historical Provider, MD  doxycycline (VIBRA-TABS) 100 MG tablet Take 1 tablet (100 mg total) by mouth 2 (two) times daily. 10/30/16   Norval Gable, MD  predniSONE (DELTASONE) 20 MG tablet  1 tab po qd for 3 days, then half a tab po qd for 3 days 10/30/16   Norval Gable, MD  simvastatin (ZOCOR) 20 MG tablet Take by mouth. 01/12/16   Historical Provider, MD  triamcinolone ointment (KENALOG) 0.1 % Reported on 01/11/2016 01/09/16   Historical Provider, MD    Family History Family History  Problem Relation Age of Onset  . Breast cancer Mother   . Breast cancer Sister   . Breast cancer Sister     Social History Social History  Substance Use Topics  . Smoking status: Current Every Day Smoker    Packs/day: 7.00    Years: 30.00    Types: Cigarettes    Last attempt to quit: 10/20/2014  . Smokeless tobacco: Never Used  . Alcohol use No     Allergies   Codeine; Sulfa antibiotics; and Aspirin   Review of Systems Review of Systems  HENT: Positive for congestion.   Respiratory: Positive for cough.      Physical Exam Triage Vital Signs ED Triage Vitals  Enc Vitals Group     BP 10/30/16 1124 (!) 161/65     Pulse Rate 10/30/16 1124 76     Resp 10/30/16 1124 18     Temp 10/30/16 1124 97.9 F (36.6 C)     Temp Source 10/30/16 1124 Oral     SpO2 10/30/16 1124 93 %     Weight 10/30/16 1126 139 lb (63 kg)     Height 10/30/16 1126 5\' 6"  (1.676 m)     Head Circumference --      Peak Flow --      Pain Score 10/30/16 1126 0     Pain Loc --      Pain Edu? --      Excl. in [place]? --    No data found.   Updated Vital Signs BP (!) 161/65 (BP Location: Left Arm)   Pulse 76   Temp 97.9 F (36.6 C) (Oral)   Resp 18   Ht 5\' 6"  (1.676 m)   Wt 139 lb (63 kg)   SpO2 93%   BMI 22.44 kg/m   Visual Acuity Right Eye Distance:   Left Eye Distance:   Bilateral Distance:    Right Eye Near:   Left Eye Near:    Bilateral Near:     Physical Exam  Constitutional: She appears well-developed and well-nourished. No distress.  HENT:  Head: Normocephalic and atraumatic.  Right Ear: Tympanic membrane, external ear and ear canal normal.  Left Ear: Tympanic membrane, external ear  and ear canal normal.  Nose: No mucosal edema, rhinorrhea, nose lacerations, sinus tenderness, nasal deformity, septal deviation or nasal septal hematoma. No epistaxis.  No foreign bodies. Right sinus exhibits no maxillary sinus tenderness and no frontal sinus tenderness. Left sinus exhibits no maxillary sinus tenderness and no frontal sinus tenderness.  Mouth/Throat: Uvula is midline, oropharynx is clear and  moist and mucous membranes are normal. No oropharyngeal exudate.  Eyes: Conjunctivae and EOM are normal. Pupils are equal, round, and reactive to light. Right eye exhibits no discharge. Left eye exhibits no discharge. No scleral icterus.  Neck: Normal range of motion. Neck supple. No thyromegaly present.  Cardiovascular: Normal rate, regular rhythm and normal heart sounds.   Pulmonary/Chest: Effort normal. No respiratory distress. She has wheezes (and diffuse rhonchi). She has no rales.  Lymphadenopathy:    She has no cervical adenopathy.  Skin: She is not diaphoretic.  Nursing note and vitals reviewed.    UC Treatments / Results  Labs (all labs ordered are listed, but only abnormal results are displayed) Labs Reviewed - No data to display  EKG  EKG Interpretation None       Radiology No results found.  Procedures Procedures (including critical care time)  Medications Ordered in UC Medications  ipratropium-albuterol (DUONEB) 0.5-2.5 (3) MG/3ML nebulizer solution 3 mL (3 mLs Nebulization Given 10/30/16 1202)     Initial Impression / Assessment and Plan / UC Course  I have reviewed the triage vital signs and the nursing notes.  Pertinent labs & imaging results that were available during my care of the patient were reviewed by me and considered in my medical decision making (see chart for details).       Final Clinical Impressions(s) / UC Diagnoses   Final diagnoses:  COPD exacerbation ([place])    New Prescriptions Discharge Medication List as of 10/30/2016 12:16 PM     START taking these medications   Details  doxycycline (VIBRA-TABS) 100 MG tablet Take 1 tablet (100 mg total) by mouth 2 (two) times daily., Starting Tue 10/30/2016, Normal    predniSONE (DELTASONE) 20 MG tablet 1 tab po qd for 3 days, then half a tab po qd for 3 days, Normal       1. diagnosis reviewed with patient 2. Patient given duoneb treatment x 1 in clinic with improvement of symptoms 3. rx as per orders above; reviewed possible side effects, interactions, risks and benefits  4. Recommend continue current home inhalers 5. Follow-up prn if symptoms worsen or don't improve   Norval Gable, MD 10/30/16 1245

## 2016-10-31 ENCOUNTER — Inpatient Hospital Stay
Admission: EM | Admit: 2016-10-31 | Discharge: 2016-11-03 | DRG: 291 | Disposition: A | Discharge status: Home Health | Payer: Medicare Other | Attending: Internal Medicine | Admitting: Internal Medicine

## 2016-10-31 ENCOUNTER — Other Ambulatory Visit: Payer: Medicare Other

## 2016-10-31 ENCOUNTER — Ambulatory Visit: Payer: Medicare Other | Admitting: Hematology and Oncology

## 2016-10-31 ENCOUNTER — Emergency Department: Payer: Medicare Other

## 2016-10-31 DIAGNOSIS — Z90710 Acquired absence of both cervix and uterus: Secondary | ICD-10-CM

## 2016-10-31 DIAGNOSIS — R0602 Shortness of breath: Secondary | ICD-10-CM | POA: Diagnosis not present

## 2016-10-31 DIAGNOSIS — J810 Acute pulmonary edema: Secondary | ICD-10-CM

## 2016-10-31 DIAGNOSIS — Z7982 Long term (current) use of aspirin: Secondary | ICD-10-CM

## 2016-10-31 DIAGNOSIS — J9601 Acute respiratory failure with hypoxia: Secondary | ICD-10-CM | POA: Diagnosis present

## 2016-10-31 DIAGNOSIS — E785 Hyperlipidemia, unspecified: Secondary | ICD-10-CM | POA: Diagnosis present

## 2016-10-31 DIAGNOSIS — Z85828 Personal history of other malignant neoplasm of skin: Secondary | ICD-10-CM

## 2016-10-31 DIAGNOSIS — Z853 Personal history of malignant neoplasm of breast: Secondary | ICD-10-CM

## 2016-10-31 DIAGNOSIS — J811 Chronic pulmonary edema: Secondary | ICD-10-CM

## 2016-10-31 DIAGNOSIS — Z7951 Long term (current) use of inhaled steroids: Secondary | ICD-10-CM

## 2016-10-31 DIAGNOSIS — I5033 Acute on chronic diastolic (congestive) heart failure: Secondary | ICD-10-CM | POA: Diagnosis present

## 2016-10-31 DIAGNOSIS — I110 Hypertensive heart disease with heart failure: Secondary | ICD-10-CM | POA: Diagnosis not present

## 2016-10-31 DIAGNOSIS — Z803 Family history of malignant neoplasm of breast: Secondary | ICD-10-CM

## 2016-10-31 DIAGNOSIS — F419 Anxiety disorder, unspecified: Secondary | ICD-10-CM | POA: Diagnosis present

## 2016-10-31 DIAGNOSIS — Z79899 Other long term (current) drug therapy: Secondary | ICD-10-CM

## 2016-10-31 DIAGNOSIS — F329 Major depressive disorder, single episode, unspecified: Secondary | ICD-10-CM | POA: Diagnosis present

## 2016-10-31 DIAGNOSIS — K219 Gastro-esophageal reflux disease without esophagitis: Secondary | ICD-10-CM | POA: Diagnosis present

## 2016-10-31 DIAGNOSIS — F17210 Nicotine dependence, cigarettes, uncomplicated: Secondary | ICD-10-CM | POA: Diagnosis present

## 2016-10-31 DIAGNOSIS — E039 Hypothyroidism, unspecified: Secondary | ICD-10-CM | POA: Diagnosis present

## 2016-10-31 DIAGNOSIS — J441 Chronic obstructive pulmonary disease with (acute) exacerbation: Secondary | ICD-10-CM

## 2016-10-31 LAB — CBC
HCT: 42.5 % (ref 35.0–47.0)
HEMOGLOBIN: 13.9 g/dL (ref 12.0–16.0)
MCH: 27.8 pg (ref 26.0–34.0)
MCHC: 32.7 g/dL (ref 32.0–36.0)
MCV: 84.9 fL (ref 80.0–100.0)
PLATELETS: 173 10*3/uL (ref 150–440)
RBC: 5.01 MIL/uL (ref 3.80–5.20)
RDW: 13.9 % (ref 11.5–14.5)
WBC: 7.5 10*3/uL (ref 3.6–11.0)

## 2016-10-31 LAB — BASIC METABOLIC PANEL
ANION GAP: 8 (ref 5–15)
BUN: 13 mg/dL (ref 6–20)
CO2: 34 mmol/L — AB (ref 22–32)
CREATININE: 0.83 mg/dL (ref 0.44–1.00)
Calcium: 9.6 mg/dL (ref 8.9–10.3)
Chloride: 92 mmol/L — ABNORMAL LOW (ref 101–111)
Glucose, Bld: 118 mg/dL — ABNORMAL HIGH (ref 65–99)
Potassium: 4.1 mmol/L (ref 3.5–5.1)
SODIUM: 134 mmol/L — AB (ref 135–145)

## 2016-10-31 LAB — TROPONIN I

## 2016-10-31 LAB — INFLUENZA PANEL BY PCR (TYPE A & B)
INFLAPCR: NEGATIVE
INFLBPCR: NEGATIVE

## 2016-10-31 LAB — BRAIN NATRIURETIC PEPTIDE: B NATRIURETIC PEPTIDE 5: 277 pg/mL — AB (ref 0.0–100.0)

## 2016-10-31 MED ORDER — ALBUTEROL SULFATE (2.5 MG/3ML) 0.083% IN NEBU
10.0000 mg | INHALATION_SOLUTION | Freq: Once | RESPIRATORY_TRACT | Status: AC
  Administered 2016-10-31: 10 mg via RESPIRATORY_TRACT
  Filled 2016-10-31: qty 12

## 2016-10-31 MED ORDER — SODIUM CHLORIDE 0.9 % IV BOLUS (SEPSIS)
1000.0000 mL | Freq: Once | INTRAVENOUS | Status: AC
  Administered 2016-10-31: 1000 mL via INTRAVENOUS

## 2016-10-31 MED ORDER — ONDANSETRON HCL 4 MG/2ML IJ SOLN
4.0000 mg | Freq: Once | INTRAMUSCULAR | Status: AC
  Administered 2016-10-31: 4 mg via INTRAVENOUS
  Filled 2016-10-31: qty 2

## 2016-10-31 MED ORDER — IPRATROPIUM-ALBUTEROL 0.5-2.5 (3) MG/3ML IN SOLN
3.0000 mL | Freq: Once | RESPIRATORY_TRACT | Status: AC
  Administered 2016-10-31: 3 mL via RESPIRATORY_TRACT
  Filled 2016-10-31: qty 3

## 2016-10-31 MED ORDER — METHYLPREDNISOLONE SODIUM SUCC 125 MG IJ SOLR
125.0000 mg | Freq: Once | INTRAMUSCULAR | Status: AC
  Administered 2016-10-31: 125 mg via INTRAVENOUS
  Filled 2016-10-31: qty 2

## 2016-10-31 NOTE — ED Provider Notes (Signed)
[facility] Emergency Department Provider Note  ____________________________________________  Time seen: Approximately 9:30 PM  I have reviewed the triage vital signs and the nursing notes.   HISTORY  Chief Complaint Shortness of Breath and Weakness   HPI Rose Irwin is a 83 y.o. female with a history of COPD and active smoking, breast cancer, hypertension, hypothyroidism who presents for evaluation of shortness of breath and weakness. Patient reports that she has felt extremely weak and fatigued for the last 3 days. Has been coughing which is productive of yellow sputum. Has had subjective fevers and chills, decreased appetite. Has had wheezing and progressively worsening shortness of breath. Went to urgent care 2 days ago and was started on doxycycline and prednisone. She reports a slight improvement of her symptoms but continues to have significant shortness of breath that is worse with minimal exertion. Has been using her albuterol inhaler home. She continues to smoke. She denies chest pain, vomiting, diarrhea, abdominal pain.  Past Medical History:  Diagnosis Date  . Anxiety   . Arthritis   . Breast cancer ([place]) 11/21/2011   right breast cancer - radiation  . Bronchitis   . Cancer [facility])    breast, thyroid  . Complication of anesthesia    smothering  . COPD (chronic obstructive pulmonary disease) ([place])   . Depression   . Elevated lipids   . GERD (gastroesophageal reflux disease)   . Hemorrhoids   . Hypertension   . Hypothyroidism   . Neuropathy ([facility])    rt leg  . Osteopenia   . Skin cancer     Patient Active Problem List   Diagnosis Date Noted  . Dysphagia 11/02/2015  . Skin cancer of nose 05/04/2015  . Esophageal stricture 05/04/2015  . Palpitations 01/18/2015  . Shortness of breath 01/17/2015  . COPD, mild ([place]) 01/03/2015  . Acquired hypothyroidism 10/28/2013  . Arthritis 10/28/2013  . Benign essential hypertension 10/28/2013  .  GERD (gastroesophageal reflux disease) 10/28/2013  . History of hemorrhoids 10/28/2013  . History of nephrolithiasis 10/28/2013  . History of thyroid cancer 10/28/2013  . Keratoacanthoma 10/28/2013  . Major depression, chronic 10/28/2013  . Mixed hyperlipidemia 10/28/2013  . Osteopenia 10/28/2013  . Breast cancer ([place]) 10/22/2011  . Thyroid cancer ([place]) 07/23/1997    Past Surgical History:  Procedure Laterality Date  . ABDOMINAL HYSTERECTOMY    . BACK SURGERY     lumbar  . bladder polyps    . BREAST SURGERY Right   . CYSTOSCOPY W/ RETROGRADES Bilateral 02/09/2015   Procedure: CYSTOSCOPY WITH RETROGRADE PYELOGRAM;  Surgeon: Hollice Espy, MD;  Location: [facility] ORS;  Service: Urology;  Laterality: Bilateral;  . LUNG SURGERY    . THYROID SURGERY      Prior to Admission medications   Medication Sig Start Date End Date Taking? Authorizing Provider  albuterol (PROAIR HFA) 108 (90 Base) MCG/ACT inhaler Inhale 2 puffs into the lungs every 4 (four) hours as needed for wheezing or shortness of breath. 10/30/16  Yes Norval Gable, MD  albuterol-ipratropium (COMBIVENT) 18-103 MCG/ACT inhaler Inhale 1 puff into the lungs every 4 (four) hours as needed for wheezing or shortness of breath.   Yes Historical Provider, MD  ALPRAZolam Duanne Moron) 0.5 MG tablet TAKE ONE TABLET TWICE DAILY AS NEEDED FOR ANXIETY 03/11/15  Yes Lequita Asal, MD  aspirin EC 81 MG tablet Take 81 mg by mouth daily.   Yes Historical Provider, MD  COMBIVENT RESPIMAT 20-100 MCG/ACT AERS respimat Inhale 1 puff  into the lungs 4 (four) times daily. 12/20/15  Yes Historical Provider, MD  doxycycline (VIBRA-TABS) 100 MG tablet Take 1 tablet (100 mg total) by mouth 2 (two) times daily. 10/30/16  Yes Norval Gable, MD  Fluticasone-Salmeterol (ADVAIR DISKUS) 250-50 MCG/DOSE AEPB Inhale into the lungs. 01/03/15  Yes Historical Provider, MD  levothyroxine (SYNTHROID, LEVOTHROID) 88 MCG tablet Take 1 tablet (88 mcg total) by mouth daily before  breakfast. 10/08/16 10/08/17 Yes Lequita Asal, MD  losartan-hydrochlorothiazide (HYZAAR) 50-12.5 MG tablet Take 1 tablet by mouth daily.    Yes Historical Provider, MD  metoprolol (LOPRESSOR) 50 MG tablet Take 50 mg by mouth 2 (two) times daily.   Yes Historical Provider, MD  PARoxetine (PAXIL) 20 MG tablet Take 30 mg by mouth daily.    Yes Historical Provider, MD  predniSONE (DELTASONE) 20 MG tablet 1 tab po qd for 3 days, then half a tab po qd for 3 days 10/30/16  Yes Norval Gable, MD  simvastatin (ZOCOR) 20 MG tablet Take 20 mg by mouth daily at 6 PM.    Yes Historical Provider, MD  traZODone (DESYREL) 50 MG tablet Take 25 mg by mouth at bedtime as needed for sleep.   Yes Historical Provider, MD  triamcinolone ointment (KENALOG) 0.1 % Reported on 01/11/2016 01/09/16   Historical Provider, MD    Allergies Codeine; Sulfa antibiotics; and Aspirin  Family History  Problem Relation Age of Onset  . Breast cancer Mother   . Breast cancer Sister   . Breast cancer Sister     Social History Social History  Substance Use Topics  . Smoking status: Current Every Day Smoker    Packs/day: 7.00    Years: 30.00    Types: Cigarettes    Last attempt to quit: 10/20/2014  . Smokeless tobacco: Never Used  . Alcohol use No    Review of Systems  Constitutional: + fever and chills and fatigue Eyes: Negative for visual changes. ENT: Negative for sore throat. Neck: No neck pain  Cardiovascular: Negative for chest pain. Respiratory: + shortness of breath, wheezing, cough Gastrointestinal: Negative for abdominal pain, vomiting or diarrhea. Genitourinary: Negative for dysuria. Musculoskeletal: Negative for back pain. Skin: Negative for rash. Neurological: Negative for headaches, weakness or numbness. Psych: No SI or HI  ____________________________________________   PHYSICAL EXAM:  VITAL SIGNS: ED Triage Vitals  Enc Vitals Group     BP 10/31/16 2023 (!) 143/71     Pulse -- 69     Resp  10/31/16 2023 (!) 26     Temp 10/31/16 2023 98.8 F (37.1 C)     Temp Source 10/31/16 2023 Oral     SpO2 10/31/16 2023 (!) 89 %     Weight --      Height --      Head Circumference --      Peak Flow --      Pain Score 10/31/16 2019 0     Pain Loc --      Pain Edu? --      Excl. in [place]? --     Constitutional: Alert and oriented, moderate respiratory distress.  HEENT:      Head: Normocephalic and atraumatic.         Eyes: Conjunctivae are normal. Sclera is non-icteric. EOMI. PERRL      Mouth/Throat: Mucous membranes are moist.       Neck: Supple with no signs of meningismus. Cardiovascular: Regular rate and rhythm. No murmurs, gallops, or rubs. 2+ symmetrical distal  pulses are present in all extremities. No JVD. Respiratory: Increased work of breathing, hypoxic to room air which improves on 2 L nasal cannula, severely diminished air movement with diffuse expiratory wheezes  Gastrointestinal: Soft, non tender, and non distended with positive bowel sounds. No rebound or guarding. Musculoskeletal: Nontender with normal range of motion in all extremities. No edema, cyanosis, or erythema of extremities. Neurologic: Normal speech and language. Face is symmetric. Moving all extremities. No gross focal neurologic deficits are appreciated. Skin: Skin is warm, dry and intact. No rash noted. Psychiatric: Mood and affect are normal. Speech and behavior are normal.  ____________________________________________   LABS (all labs ordered are listed, but only abnormal results are displayed)  Labs Reviewed  BASIC METABOLIC PANEL - Abnormal; Notable for the following:       Result Value   Sodium 134 (*)    Chloride 92 (*)    CO2 34 (*)    Glucose, Bld 118 (*)    All other components within normal limits  BRAIN NATRIURETIC PEPTIDE - Abnormal; Notable for the following:    B Natriuretic Peptide 277.0 (*)    All other components within normal limits  CBC  INFLUENZA PANEL BY PCR (TYPE A & B)    TROPONIN I  URINALYSIS, COMPLETE (UACMP) WITH MICROSCOPIC  CBG MONITORING, ED   ____________________________________________  EKG  ED ECG REPORT I, Rudene Re, the attending physician, personally viewed and interpreted this ECG.   Normal sinus rhythm, rate of 61, normal intervals, normal axis, no ST elevations or depressions, flattening T waves on aVL, V2 and V3. ____________________________________________  RADIOLOGY  CXR: Mild vascular congestion and cardiomegaly. Increased interstitial markings raise question for minimal interstitial edema.  ____________________________________________   PROCEDURES  Procedure(s) performed: None Procedures Critical Care performed: yes  CRITICAL CARE Performed by: Rudene Re  ?  Total critical care time: 35 min  Critical care time was exclusive of separately billable procedures and treating other patients.  Critical care was necessary to treat or prevent imminent or life-threatening deterioration.  Critical care was time spent personally by me on the following activities: development of treatment plan with patient and/or surrogate as well as nursing, discussions with consultants, evaluation of patient's response to treatment, examination of patient, obtaining history from patient or surrogate, ordering and performing treatments and interventions, ordering and review of laboratory studies, ordering and review of radiographic studies, pulse oximetry and re-evaluation of patient's condition.  ____________________________________________   INITIAL IMPRESSION / ASSESSMENT AND PLAN / ED COURSE  83 y.o. female with a history of COPD and active smoking, breast cancer, hypertension, hypothyroidism who presents for evaluation of 3 days of productive cough, generalized fatigue and weakness, decreased appetite, fever, chills, wheezing, shortness of breath. Patient has been started on doxycycline 2 days ago at urgent care. She is in  moderate respiratory distress, hypoxic, increased work of breathing, severely diminished air movement with expiratory wheezes. We'll give DuoNeb 3, dose of Solu-Medrol, chest x-ray to rule out pneumonia, flu swab Clinical Course as of Nov 01 4  Thu Nov 01, 2016  0005 Chest x-ray concerning for pulmonary edema. BNP is elevated to 77. Patient continues to wheeze after 3 DuoNeb labs and 10 mg of albuterol over 1 hour. Continues to have oxygen requirement. We'll admit to the hospitalist  [CV]    Clinical Course User Index [CV] Rudene Re, MD    Pertinent labs & imaging results that were available during my care of the patient were reviewed by me and  considered in my medical decision making (see chart for details).    ____________________________________________   FINAL CLINICAL IMPRESSION(S) / ED DIAGNOSES  Final diagnoses:  Acute respiratory failure with hypoxia (HCC)  COPD exacerbation (H[facility])  Acute pulmonary edema (HCC)      NEW MEDICATIONS STARTED DURING THIS VISIT:  New Prescriptions   No medications on file     Note:  This document was prepared using Dragon voice recognition software and may include unintentional dictation errors.    Rudene Re, MD 11/01/16 [phone]

## 2016-10-31 NOTE — ED Notes (Addendum)
Pt c/o shortness of breath for the last 2 days - she went to urgent care and was placed on 2 atb and albuterol yesterday - her O2 sat dropped to 87% while at home today - pt has hx of COPD and smokes daily

## 2016-10-31 NOTE — ED Triage Notes (Signed)
Patient reports shortness of breath for 3 days.  Reports seen at Urgent Care yesterday and states "they mentioned COPD".  Reports not feeling any better.  Patient with shortness of breath with exertion.

## 2016-11-01 ENCOUNTER — Inpatient Hospital Stay
Admit: 2016-11-01 | Discharge: 2016-11-01 | Disposition: A | Discharge status: Home / Self Care | Payer: Medicare Other | Attending: Family Medicine | Admitting: Family Medicine

## 2016-11-01 DIAGNOSIS — Z79899 Other long term (current) drug therapy: Secondary | ICD-10-CM | POA: Diagnosis not present

## 2016-11-01 DIAGNOSIS — K219 Gastro-esophageal reflux disease without esophagitis: Secondary | ICD-10-CM | POA: Diagnosis present

## 2016-11-01 DIAGNOSIS — Z85828 Personal history of other malignant neoplasm of skin: Secondary | ICD-10-CM | POA: Diagnosis not present

## 2016-11-01 DIAGNOSIS — R0602 Shortness of breath: Secondary | ICD-10-CM | POA: Diagnosis present

## 2016-11-01 DIAGNOSIS — E039 Hypothyroidism, unspecified: Secondary | ICD-10-CM | POA: Diagnosis present

## 2016-11-01 DIAGNOSIS — Z853 Personal history of malignant neoplasm of breast: Secondary | ICD-10-CM | POA: Diagnosis not present

## 2016-11-01 DIAGNOSIS — F419 Anxiety disorder, unspecified: Secondary | ICD-10-CM | POA: Diagnosis present

## 2016-11-01 DIAGNOSIS — J441 Chronic obstructive pulmonary disease with (acute) exacerbation: Secondary | ICD-10-CM | POA: Diagnosis present

## 2016-11-01 DIAGNOSIS — Z90710 Acquired absence of both cervix and uterus: Secondary | ICD-10-CM | POA: Diagnosis not present

## 2016-11-01 DIAGNOSIS — Z7951 Long term (current) use of inhaled steroids: Secondary | ICD-10-CM | POA: Diagnosis not present

## 2016-11-01 DIAGNOSIS — Z803 Family history of malignant neoplasm of breast: Secondary | ICD-10-CM | POA: Diagnosis not present

## 2016-11-01 DIAGNOSIS — E785 Hyperlipidemia, unspecified: Secondary | ICD-10-CM | POA: Diagnosis present

## 2016-11-01 DIAGNOSIS — F329 Major depressive disorder, single episode, unspecified: Secondary | ICD-10-CM | POA: Diagnosis present

## 2016-11-01 DIAGNOSIS — J9601 Acute respiratory failure with hypoxia: Secondary | ICD-10-CM | POA: Diagnosis present

## 2016-11-01 DIAGNOSIS — I5033 Acute on chronic diastolic (congestive) heart failure: Secondary | ICD-10-CM | POA: Diagnosis present

## 2016-11-01 DIAGNOSIS — F17210 Nicotine dependence, cigarettes, uncomplicated: Secondary | ICD-10-CM | POA: Diagnosis present

## 2016-11-01 DIAGNOSIS — Z7982 Long term (current) use of aspirin: Secondary | ICD-10-CM | POA: Diagnosis not present

## 2016-11-01 DIAGNOSIS — I110 Hypertensive heart disease with heart failure: Secondary | ICD-10-CM | POA: Diagnosis present

## 2016-11-01 LAB — URINALYSIS, COMPLETE (UACMP) WITH MICROSCOPIC
BACTERIA UA: NONE SEEN
BILIRUBIN URINE: NEGATIVE
Glucose, UA: NEGATIVE mg/dL
Ketones, ur: NEGATIVE mg/dL
LEUKOCYTES UA: NEGATIVE
Nitrite: NEGATIVE
PROTEIN: NEGATIVE mg/dL
Specific Gravity, Urine: 1.011 (ref 1.005–1.030)
pH: 5 (ref 5.0–8.0)

## 2016-11-01 LAB — TROPONIN I
Troponin I: <0.03 ng/mL (ref <0.03)
Troponin I: <0.03 ng/mL (ref <0.03)

## 2016-11-01 LAB — CBC
HCT: 40.4 % (ref 35.0–47.0)
HEMOGLOBIN: 13.3 g/dL (ref 12.0–16.0)
MCH: 27.3 pg (ref 26.0–34.0)
MCHC: 33 g/dL (ref 32.0–36.0)
MCV: 82.6 fL (ref 80.0–100.0)
PLATELETS: 165 10*3/uL (ref 150–440)
RBC: 4.89 MIL/uL (ref 3.80–5.20)
RDW: 13.6 % (ref 11.5–14.5)
WBC: 8.2 10*3/uL (ref 3.6–11.0)

## 2016-11-01 LAB — LIPID PANEL
CHOL/HDL RATIO: 2.1 ratio
CHOLESTEROL: 179 mg/dL (ref 0–200)
HDL: 85 mg/dL (ref >40)
LDL CALC: 84 mg/dL (ref 0–99)
TRIGLYCERIDES: 49 mg/dL (ref <150)
VLDL: 10 mg/dL (ref 0–40)

## 2016-11-01 LAB — BASIC METABOLIC PANEL
Anion gap: 7 (ref 5–15)
BUN: 12 mg/dL (ref 6–20)
CALCIUM: 9 mg/dL (ref 8.9–10.3)
CHLORIDE: 94 mmol/L — AB (ref 101–111)
CO2: 35 mmol/L — AB (ref 22–32)
CREATININE: 0.75 mg/dL (ref 0.44–1.00)
GFR calc Af Amer: >60 mL/min (ref >60)
GFR calc non Af Amer: >60 mL/min (ref >60)
GLUCOSE: 162 mg/dL — AB (ref 65–99)
Potassium: 3.3 mmol/L — ABNORMAL LOW (ref 3.5–5.1)
Sodium: 136 mmol/L (ref 135–145)

## 2016-11-01 LAB — TSH: TSH: 0.533 u[IU]/mL (ref 0.350–4.500)

## 2016-11-01 MED ORDER — ENOXAPARIN SODIUM 40 MG/0.4ML ~~LOC~~ SOLN
40.0000 mg | SUBCUTANEOUS | Status: DC
  Administered 2016-11-01 – 2016-11-03 (×3): 40 mg via SUBCUTANEOUS
  Filled 2016-11-01 (×3): qty 0.4

## 2016-11-01 MED ORDER — NICOTINE 21 MG/24HR TD PT24
21.0000 mg | MEDICATED_PATCH | Freq: Every day | TRANSDERMAL | Status: DC
  Filled 2016-11-01 (×3): qty 1

## 2016-11-01 MED ORDER — BISACODYL 5 MG PO TBEC: 5.0000 mg | DELAYED_RELEASE_TABLET | Freq: Every day | ORAL | Status: DC | PRN

## 2016-11-01 MED ORDER — METOPROLOL TARTRATE 50 MG PO TABS
50.0000 mg | ORAL_TABLET | Freq: Two times a day (BID) | ORAL | Status: DC
  Administered 2016-11-01 – 2016-11-03 (×6): 50 mg via ORAL
  Filled 2016-11-01 (×7): qty 1

## 2016-11-01 MED ORDER — ACETAMINOPHEN 325 MG PO TABS
650.0000 mg | ORAL_TABLET | Freq: Four times a day (QID) | ORAL | Status: DC | PRN
  Filled 2016-11-01: qty 2

## 2016-11-01 MED ORDER — GUAIFENESIN ER 600 MG PO TB12
600.0000 mg | ORAL_TABLET | Freq: Two times a day (BID) | ORAL | Status: DC
  Administered 2016-11-01 – 2016-11-03 (×6): 600 mg via ORAL
  Filled 2016-11-01 (×7): qty 1

## 2016-11-01 MED ORDER — IPRATROPIUM-ALBUTEROL 0.5-2.5 (3) MG/3ML IN SOLN
3.0000 mL | Freq: Four times a day (QID) | RESPIRATORY_TRACT | Status: DC
  Administered 2016-11-01 – 2016-11-03 (×9): 3 mL via RESPIRATORY_TRACT
  Filled 2016-11-01 (×9): qty 3

## 2016-11-01 MED ORDER — ALPRAZOLAM 0.5 MG PO TABS
0.5000 mg | ORAL_TABLET | Freq: Two times a day (BID) | ORAL | Status: DC | PRN
  Administered 2016-11-01 – 2016-11-02 (×4): 0.5 mg via ORAL
  Filled 2016-11-01 (×4): qty 1

## 2016-11-01 MED ORDER — LOSARTAN POTASSIUM-HCTZ 50-12.5 MG PO TABS: 1.0000 | ORAL_TABLET | Freq: Every day | ORAL | Status: DC

## 2016-11-01 MED ORDER — SODIUM CHLORIDE 0.9% FLUSH
3.0000 mL | Freq: Two times a day (BID) | INTRAVENOUS | Status: DC
  Administered 2016-11-01 – 2016-11-02 (×5): 3 mL via INTRAVENOUS

## 2016-11-01 MED ORDER — IPRATROPIUM BROMIDE 0.02 % IN SOLN: 0.5000 mg | Freq: Four times a day (QID) | RESPIRATORY_TRACT | Status: DC | PRN

## 2016-11-01 MED ORDER — LEVOTHYROXINE SODIUM 88 MCG PO TABS
88.0000 ug | ORAL_TABLET | Freq: Every day | ORAL | Status: DC
  Administered 2016-11-01 – 2016-11-03 (×3): 88 ug via ORAL
  Filled 2016-11-01 (×3): qty 1

## 2016-11-01 MED ORDER — PAROXETINE HCL 30 MG PO TABS
30.0000 mg | ORAL_TABLET | Freq: Every day | ORAL | Status: DC
  Administered 2016-11-01 – 2016-11-03 (×3): 30 mg via ORAL
  Filled 2016-11-01 (×3): qty 1

## 2016-11-01 MED ORDER — DEXTROSE 5 % IV SOLN
500.0000 mg | INTRAVENOUS | Status: DC
  Administered 2016-11-01 – 2016-11-02 (×2): 500 mg via INTRAVENOUS
  Filled 2016-11-01 (×2): qty 500

## 2016-11-01 MED ORDER — FUROSEMIDE 10 MG/ML IJ SOLN
20.0000 mg | Freq: Once | INTRAMUSCULAR | Status: AC
  Administered 2016-11-01: 20 mg via INTRAVENOUS
  Filled 2016-11-01: qty 4

## 2016-11-01 MED ORDER — ACETAMINOPHEN 650 MG RE SUPP: 650.0000 mg | Freq: Four times a day (QID) | RECTAL | Status: DC | PRN

## 2016-11-01 MED ORDER — DM-GUAIFENESIN ER 30-600 MG PO TB12: 1.0000 | ORAL_TABLET | Freq: Two times a day (BID) | ORAL | Status: DC

## 2016-11-01 MED ORDER — HYDROCHLOROTHIAZIDE 12.5 MG PO CAPS
12.5000 mg | ORAL_CAPSULE | Freq: Every day | ORAL | Status: DC
  Administered 2016-11-01 – 2016-11-03 (×3): 12.5 mg via ORAL
  Filled 2016-11-01 (×3): qty 1

## 2016-11-01 MED ORDER — LOSARTAN POTASSIUM 50 MG PO TABS
50.0000 mg | ORAL_TABLET | Freq: Every day | ORAL | Status: DC
  Administered 2016-11-01 – 2016-11-03 (×3): 50 mg via ORAL
  Filled 2016-11-01 (×3): qty 1

## 2016-11-01 MED ORDER — ONDANSETRON HCL 4 MG/2ML IJ SOLN: 4.0000 mg | Freq: Four times a day (QID) | INTRAMUSCULAR | Status: DC | PRN

## 2016-11-01 MED ORDER — METHYLPREDNISOLONE SODIUM SUCC 125 MG IJ SOLR
60.0000 mg | Freq: Four times a day (QID) | INTRAMUSCULAR | Status: DC
  Administered 2016-11-01 – 2016-11-02 (×6): 60 mg via INTRAVENOUS
  Filled 2016-11-01 (×6): qty 2

## 2016-11-01 MED ORDER — ONDANSETRON HCL 4 MG PO TABS: 4.0000 mg | ORAL_TABLET | Freq: Four times a day (QID) | ORAL | Status: DC | PRN

## 2016-11-01 MED ORDER — MOMETASONE FURO-FORMOTEROL FUM 200-5 MCG/ACT IN AERO
2.0000 | INHALATION_SPRAY | Freq: Two times a day (BID) | RESPIRATORY_TRACT | Status: DC
  Administered 2016-11-01 – 2016-11-03 (×5): 2 via RESPIRATORY_TRACT
  Filled 2016-11-01: qty 8.8

## 2016-11-01 MED ORDER — ACETAMINOPHEN 325 MG PO TABS
325.0000 mg | ORAL_TABLET | Freq: Four times a day (QID) | ORAL | Status: DC | PRN
  Administered 2016-11-01 – 2016-11-02 (×2): 325 mg via ORAL
  Filled 2016-11-01: qty 1

## 2016-11-01 MED ORDER — SENNOSIDES-DOCUSATE SODIUM 8.6-50 MG PO TABS: 1.0000 | ORAL_TABLET | Freq: Every evening | ORAL | Status: DC | PRN

## 2016-11-01 MED ORDER — DEXTROMETHORPHAN POLISTIREX ER 30 MG/5ML PO SUER
30.0000 mg | Freq: Two times a day (BID) | ORAL | Status: DC
  Administered 2016-11-01 – 2016-11-03 (×6): 30 mg via ORAL
  Filled 2016-11-01 (×7): qty 5

## 2016-11-01 MED ORDER — ALBUTEROL SULFATE (2.5 MG/3ML) 0.083% IN NEBU: 2.5000 mg | INHALATION_SOLUTION | Freq: Four times a day (QID) | RESPIRATORY_TRACT | Status: DC | PRN

## 2016-11-01 MED ORDER — MAGNESIUM CITRATE PO SOLN
1.0000 | Freq: Once | ORAL | Status: DC | PRN
  Filled 2016-11-01: qty 296

## 2016-11-01 MED ORDER — ASPIRIN EC 81 MG PO TBEC
81.0000 mg | DELAYED_RELEASE_TABLET | Freq: Every day | ORAL | Status: DC
  Administered 2016-11-01 – 2016-11-03 (×3): 81 mg via ORAL
  Filled 2016-11-01 (×3): qty 1

## 2016-11-01 MED ORDER — SIMVASTATIN 20 MG PO TABS
20.0000 mg | ORAL_TABLET | Freq: Every day | ORAL | Status: DC
  Administered 2016-11-02: 18:00:00 20 mg via ORAL
  Filled 2016-11-01 (×2): qty 1

## 2016-11-01 MED ORDER — LIDOCAINE-EPINEPHRINE (PF) 2 %-1:200000 IJ SOLN
INTRAMUSCULAR | Status: AC
  Filled 2016-11-01: qty 20

## 2016-11-01 MED ORDER — TRAZODONE HCL 50 MG PO TABS: 25.0000 mg | ORAL_TABLET | Freq: Every evening | ORAL | Status: DC | PRN

## 2016-11-01 NOTE — Progress Notes (Signed)
Pts influenza PCR negative, droplet precaution d/c.

## 2016-11-01 NOTE — Progress Notes (Signed)
[place] [facility] [place] NAME: Rose Irwin    MR#:  [id number]  DATE OF BIRTH:  15-Nov-1931  SUBJECTIVE:  CHIEF COMPLAINT:   Chief Complaint  Patient presents with  . Shortness of Breath  . Weakness      Came with persistent respi distress- inspite of takig oral prednisone and Doxy for 2-3 days.   Also noted to have ankle edema.   Feels somewhat better today.  REVIEW OF SYSTEMS:  CONSTITUTIONAL: No fever, fatigue or weakness.  EYES: No blurred or double vision.  EARS, NOSE, AND THROAT: No tinnitus or ear pain.  RESPIRATORY: No cough, positive for shortness of breath, wheezing ,no hemoptysis.  CARDIOVASCULAR: No chest pain, orthopnea, edema.  GASTROINTESTINAL: No nausea, vomiting, diarrhea or abdominal pain.  GENITOURINARY: No dysuria, hematuria.  ENDOCRINE: No polyuria, nocturia,  HEMATOLOGY: No anemia, easy bruising or bleeding SKIN: No rash or lesion. MUSCULOSKELETAL: No joint pain or arthritis.   NEUROLOGIC: No tingling, numbness, weakness.  PSYCHIATRY: No anxiety or depression.   ROS  DRUG ALLERGIES:   Allergies  Allergen Reactions  . Codeine Other (See Comments)    Go limp, sweat  . Sulfa Antibiotics Swelling    tongue  . Aspirin Palpitations    VITALS:  Blood pressure (!) 133/52, pulse 75, temperature 97.7 F (36.5 C), temperature source Oral, resp. rate 18, height 5\' 6"  (1.676 m), weight 63.6 kg (140 lb 4.8 oz), SpO2 97 %.  PHYSICAL EXAMINATION:  GENERAL:  83 y.o.-year-old patient lying in the bed with no acute distress.  EYES: Pupils equal, round, reactive to light and accommodation. No scleral icterus. Extraocular muscles intact.  HEENT: Head atraumatic, normocephalic. Oropharynx and nasopharynx clear.  NECK:  Supple, no jugular venous distention. No thyroid enlargement, no tenderness.  LUNGS: Normal breath sounds bilaterally, some wheezing, some crepitation. No use of accessory muscles of respiration.   CARDIOVASCULAR: S1, S2 normal. No murmurs, rubs, or gallops.  ABDOMEN: Soft, nontender, nondistended. Bowel sounds present. No organomegaly or mass.  EXTREMITIES: some pedal edema,no cyanosis, or clubbing.  NEUROLOGIC: Cranial nerves II through XII are intact. Muscle strength 5/5 in all extremities. Sensation intact. Gait not checked.  PSYCHIATRIC: The patient is alert and oriented x 3.  SKIN: No obvious rash, lesion, or ulcer.   Physical Exam LABORATORY PANEL:   CBC  Recent Labs Lab 11/01/16 0313  WBC 8.2  HGB 13.3  HCT 40.4  PLT 165   ------------------------------------------------------------------------------------------------------------------  Chemistries   Recent Labs Lab 11/01/16 0313  NA 136  K 3.3*  CL 94*  CO2 35*  GLUCOSE 162*  BUN 12  CREATININE 0.75  CALCIUM 9.0   ------------------------------------------------------------------------------------------------------------------  Cardiac Enzymes  Recent Labs Lab 10/31/16 2022 11/01/16 0313  TROPONINI <0.03 <0.03   ------------------------------------------------------------------------------------------------------------------  RADIOLOGY:  Dg Chest Portable 1 View  Result Date: 10/31/2016 CLINICAL DATA:  Acute onset of shortness of breath with exertion. Initial encounter. EXAM: PORTABLE CHEST 1 VIEW COMPARISON:  Chest radiograph from 09/14/2013 FINDINGS: The lungs are well-aerated. Mild vascular congestion is noted. Increased interstitial markings raise question for minimal interstitial edema. There is no evidence of pleural effusion or pneumothorax. The cardiomediastinal silhouette is enlarged. No acute osseous abnormalities are seen. Clips are noted overlying the left supraclavicular region. IMPRESSION: Mild vascular congestion and cardiomegaly. Increased interstitial markings raise question for minimal interstitial edema. Electronically Signed   By: Garald Balding M.D.   On: 10/31/2016 21:53     ASSESSMENT AND PLAN:   Active  Problems:   Acute respiratory failure with hypoxia (HCC)  #. Acute respiratory failure secondary to exacerbation of COPD and new onset of pulmonary edema questionable CHF  #. Acute exacerbation of COPD - IV steroids - Nebulizers, O2 therapy and expectorants as needed.  - negative flu swab and sputum culture -Continued Dulera for Advair  #. Acute pulmonary edema / ?congestive heart failure - Telemetry monitoring. - Continue beta blocker/ARB, on HCTz - Intake/output, daily weight. - Trend troponins, check lipids and TSH. - Echo  #. Tobacco use disorder, continuous -Cessation advised for 4 min. -Nicotine patch as needed  #. History of hypertension -Continue Lopressor, Cozaar  #. History of hyperlipidemia -Continue Zocor  #. History of history of hypothyroidism -Continue Synthroid  #. History of depression/anxiety - Continue Paxil, Xanax  -Continue aspirin     All the records are reviewed and case discussed with Care Management/Social Workerr. Management plans discussed with the patient, family and they are in agreement.  CODE STATUS: Full.  TOTAL TIME TAKING CARE OF THIS PATIENT: 35 minutes.   POSSIBLE D/C IN 1-2 DAYS, DEPENDING ON CLINICAL CONDITION.   Vaughan Basta M.D on 11/01/2016   Between 7am to 6pm - Pager - [phone]  After 6pm go to [email or web address] - password [email or web address] [facility]  Office  [phone]  CC: Primary care physician; Juluis Pitch, MD  Note: This dictation was prepared with Dragon dictation along with smaller phrase technology. Any transcriptional errors that result from this process are unintentional.

## 2016-11-01 NOTE — ED Notes (Signed)
This RN assist pt to toilet in room. No complications.,

## 2016-11-01 NOTE — H&P (Signed)
History and Physical   [facility] - [place] @ [facility] Admission History and Physical [employer], D.O.    Patient Name: Rose Irwin MR#: [id number] Date of Birth: 1932/05/01 Date of Admission: 10/31/2016  Referring MD/NP/PA: Dr. Alfred Levins Primary Care Physician: Juluis Pitch, MD Patient coming from: Home Outpatient Specialists: Dr. Mike Gip, Dr. Allen Norris  Chief Complaint:  Chief Complaint  Patient presents with  . Shortness of Breath  . Weakness    HPI: Rose Irwin is a 83 y.o. female with a known history of anxiety, depression, breast cancer, COPD with active smoking, GERD, hypertension, hyperlipidemia, hypothyroidism presents to the emergency department for evaluation of breath.  Patient was in a usual state of health until 3 days prior to arrival when she describes the onset of weakness, fatigue, cough productive of yellow sputum, fevers and chills and decreased appetite. She was seen by urgent care and started on a course of doxycycline and prednisone for presumed bronchitis however his symptoms persisted despite using the medications along with home nebulizers..  Otherwise there has been no change in status. Patient has been taking medication as prescribed and there has been no recent change in medication or diet.  No recent antibiotics.  There has been no recent illness, hospitalizations, travel or sick contacts.    EMS/ED Course: Patient received DuoNeb 3, albuterol 1 Solu-Medrol, Lasix, Zofran, normal saline.  Review of Systems:  CONSTITUTIONAL: Positive  fever/chills, fatigue, weakness, negative weight gain/loss, headache. EYES: No blurry or double vision. ENT: No tinnitus, postnasal drip, redness or soreness of the oropharynx. RESPIRATORY: Positive cough, dyspnea, wheeze.  No hemoptysis.  CARDIOVASCULAR: No chest pain, palpitations, syncope, orthopnea. No lower extremity edema.  GASTROINTESTINAL: No nausea, vomiting, abdominal pain, diarrhea, constipation.   No hematemesis, melena or hematochezia. GENITOURINARY: No dysuria, frequency, hematuria. ENDOCRINE: No polyuria or nocturia. No heat or cold intolerance. HEMATOLOGY: No anemia, bruising, bleeding. INTEGUMENTARY: No rashes, ulcers, lesions. MUSCULOSKELETAL: No arthritis, gout, dyspnea. NEUROLOGIC: No numbness, tingling, ataxia, seizure-type activity, weakness. PSYCHIATRIC: No anxiety, depression, insomnia.   Past Medical History:  Diagnosis Date  . Anxiety   . Arthritis   . Breast cancer ([place]) 11/21/2011   right breast cancer - radiation  . Bronchitis   . Cancer [facility])    breast, thyroid  . Complication of anesthesia    smothering  . COPD (chronic obstructive pulmonary disease) ([place])   . Depression   . Elevated lipids   . GERD (gastroesophageal reflux disease)   . Hemorrhoids   . Hypertension   . Hypothyroidism   . Neuropathy (HCC)    rt leg  . Osteopenia   . Skin cancer     Past Surgical History:  Procedure Laterality Date  . ABDOMINAL HYSTERECTOMY    . BACK SURGERY     lumbar  . bladder polyps    . BREAST SURGERY Right   . CYSTOSCOPY W/ RETROGRADES Bilateral 02/09/2015   Procedure: CYSTOSCOPY WITH RETROGRADE PYELOGRAM;  Surgeon: Hollice Espy, MD;  Location: [facility] ORS;  Service: Urology;  Laterality: Bilateral;  . LUNG SURGERY    . THYROID SURGERY       reports that she has been smoking Cigarettes.  She has a 210.00 pack-year smoking history. She has never used smokeless tobacco. She reports that she does not drink alcohol or use drugs.  Allergies  Allergen Reactions  . Codeine Other (See Comments)    Go limp, sweat  . Sulfa Antibiotics Swelling    tongue  . Aspirin Palpitations    Family  History  Problem Relation Age of Onset  . Breast cancer Mother   . Breast cancer Sister   . Breast cancer Sister     Prior to Admission medications   Medication Sig Start Date End Date Taking? Authorizing Provider  albuterol (PROAIR HFA) 108 (90 Base) MCG/ACT inhaler  Inhale 2 puffs into the lungs every 4 (four) hours as needed for wheezing or shortness of breath. 10/30/16  Yes Norval Gable, MD  albuterol-ipratropium (COMBIVENT) 18-103 MCG/ACT inhaler Inhale 1 puff into the lungs every 4 (four) hours as needed for wheezing or shortness of breath.   Yes Historical Provider, MD  ALPRAZolam Duanne Moron) 0.5 MG tablet TAKE ONE TABLET TWICE DAILY AS NEEDED FOR ANXIETY 03/11/15  Yes Lequita Asal, MD  aspirin EC 81 MG tablet Take 81 mg by mouth daily.   Yes Historical Provider, MD  COMBIVENT RESPIMAT 20-100 MCG/ACT AERS respimat Inhale 1 puff into the lungs 4 (four) times daily. 12/20/15  Yes Historical Provider, MD  doxycycline (VIBRA-TABS) 100 MG tablet Take 1 tablet (100 mg total) by mouth 2 (two) times daily. 10/30/16  Yes Norval Gable, MD  Fluticasone-Salmeterol (ADVAIR DISKUS) 250-50 MCG/DOSE AEPB Inhale into the lungs. 01/03/15  Yes Historical Provider, MD  levothyroxine (SYNTHROID, LEVOTHROID) 88 MCG tablet Take 1 tablet (88 mcg total) by mouth daily before breakfast. 10/08/16 10/08/17 Yes Lequita Asal, MD  losartan-hydrochlorothiazide (HYZAAR) 50-12.5 MG tablet Take 1 tablet by mouth daily.    Yes Historical Provider, MD  metoprolol (LOPRESSOR) 50 MG tablet Take 50 mg by mouth 2 (two) times daily.   Yes Historical Provider, MD  PARoxetine (PAXIL) 20 MG tablet Take 30 mg by mouth daily.    Yes Historical Provider, MD  predniSONE (DELTASONE) 20 MG tablet 1 tab po qd for 3 days, then half a tab po qd for 3 days 10/30/16  Yes Norval Gable, MD  simvastatin (ZOCOR) 20 MG tablet Take 20 mg by mouth daily at 6 PM.    Yes Historical Provider, MD  traZODone (DESYREL) 50 MG tablet Take 25 mg by mouth at bedtime as needed for sleep.   Yes Historical Provider, MD  triamcinolone ointment (KENALOG) 0.1 % Reported on 01/11/2016 01/09/16   Historical Provider, MD    Physical Exam: Vitals:   10/31/16 2023 10/31/16 2300  BP: (!) 143/71 (!) 175/80  Pulse:  93  Resp: (!) 26 17   Temp: 98.8 F (37.1 C)   TempSrc: Oral   SpO2: (!) 89% 97%    GENERAL: 83 y.o.-year-old Female patient, well-developed, well-nourished lying in the bed in no acute distress.  Pleasant and cooperative.   HEENT: Head atraumatic, normocephalic. Pupils equal, round, reactive to light and accommodation. No scleral icterus. Extraocular muscles intact. . Mucus membranes moist. NECK: Supple, full range of motion.  CHEST: Decreased air movement with slight expiratory wheezing. No use of accessory muscles of respiration.  No reproducible chest wall tenderness.  CARDIOVASCULAR: S1, S2 normal. No murmurs, rubs, or gallops. Cap refill <2 seconds. Pulses intact distally.  ABDOMEN: Soft, nondistended, nontender. No rebound, guarding, rigidity. Normoactive bowel sounds present in all four quadrants. No organomegaly or mass. EXTREMITIES: No pedal edema, cyanosis, or clubbing. No calf tenderness or Homan's sign.  NEUROLOGIC: The patient is alert and oriented x 3. Cranial nerves II through XII are grossly intact with no focal sensorimotor deficit. Muscle strength 5/5 in all extremities. Sensation intact. Gait not checked. PSYCHIATRIC:  Normal affect, mood, thought content. SKIN: Warm, dry, and intact without obvious  rash, lesion, or ulcer.    Labs on Admission:  CBC:  Recent Labs Lab 10/31/16 2022  WBC 7.5  HGB 13.9  HCT 42.5  MCV 84.9  PLT 941   Basic Metabolic Panel:  Recent Labs Lab 10/31/16 2022  NA 134*  K 4.1  CL 92*  CO2 34*  GLUCOSE 118*  BUN 13  CREATININE 0.83  CALCIUM 9.6   GFR: Estimated Creatinine Clearance: 48.9 mL/min (by C-G formula based on SCr of 0.83 mg/dL). Liver Function Tests: No results for input(s): AST, ALT, ALKPHOS, BILITOT, PROT, ALBUMIN in the last 168 hours. No results for input(s): LIPASE, AMYLASE in the last 168 hours. No results for input(s): AMMONIA in the last 168 hours. Coagulation Profile: No results for input(s): INR, PROTIME in the last 168  hours. Cardiac Enzymes:  Recent Labs Lab 10/31/16 2022  TROPONINI <0.03   BNP (last 3 results) No results for input(s): PROBNP in the last 8760 hours. HbA1C: No results for input(s): HGBA1C in the last 72 hours. CBG: No results for input(s): GLUCAP in the last 168 hours. Lipid Profile: No results for input(s): CHOL, HDL, LDLCALC, TRIG, CHOLHDL, LDLDIRECT in the last 72 hours. Thyroid Function Tests: No results for input(s): TSH, T4TOTAL, FREET4, T3FREE, THYROIDAB in the last 72 hours. Anemia Panel: No results for input(s): VITAMINB12, FOLATE, FERRITIN, TIBC, IRON, RETICCTPCT in the last 72 hours. Urine analysis:    Component Value Date/Time   COLORURINE YELLOW (A) 11/01/2016 0027   APPEARANCEUR CLEAR (A) 11/01/2016 0027   LABSPEC 1.011 11/01/2016 0027   PHURINE 5.0 11/01/2016 0027   GLUCOSEU NEGATIVE 11/01/2016 0027   HGBUR SMALL (A) 11/01/2016 0027   BILIRUBINUR NEGATIVE 11/01/2016 0027   KETONESUR NEGATIVE 11/01/2016 0027   PROTEINUR NEGATIVE 11/01/2016 0027   NITRITE NEGATIVE 11/01/2016 0027   LEUKOCYTESUR NEGATIVE 11/01/2016 0027   Sepsis Labs: @LABRCNTIP (procalcitonin:4,lacticidven:4) )No results found for this or any previous visit (from the past 240 hour(s)).   Radiological Exams on Admission: Dg Chest Portable 1 View  Result Date: 10/31/2016 CLINICAL DATA:  Acute onset of shortness of breath with exertion. Initial encounter. EXAM: PORTABLE CHEST 1 VIEW COMPARISON:  Chest radiograph from 09/14/2013 FINDINGS: The lungs are well-aerated. Mild vascular congestion is noted. Increased interstitial markings raise question for minimal interstitial edema. There is no evidence of pleural effusion or pneumothorax. The cardiomediastinal silhouette is enlarged. No acute osseous abnormalities are seen. Clips are noted overlying the left supraclavicular region. IMPRESSION: Mild vascular congestion and cardiomegaly. Increased interstitial markings raise question for minimal  interstitial edema. Electronically Signed   By: Garald Balding M.D.   On: 10/31/2016 21:53    EKG: Normal sinus rhythm at 61 bpm with normal axis and nonspecific ST-T wave changes.   Assessment/Plan  This is a 83 y.o. female with a history of Anxiety, depression, breast cancer, COPD with active smoking, GERD, hypertension, hyperlipidemia, hypothyroidism now being admitted with:  #. Acute respiratory failure secondary to exacerbation of COPD and new onset of pulmonary edema questionable CHF  #. Acute exacerbation of COPD - IV steroids - Nebulizers, O2 therapy and expectorants as needed.  - Check flu swab and sputum culture - Continuous pulse oximetry - Consider pulmonary consult if not improving.  -Continued Dulera for Advair  #. Acute pulmonary edema / ?congestive heart failure - Telemetry monitoring. - Continue beta blocker/ARB, consider adding diuretic - Intake/output, daily weight. - Trend troponins, check lipids and TSH. - Echo  #. Tobacco use disorder, continuous -Cessation advised -Nicotine patch as  needed  #. History of hypertension -Continue Lopressor, Cozaar  #. History of hyperlipidemia -Continue Zocor  #. History of history of hypothyroidism -Continue Synthroid  #. History of depression/anxiety - Continue Paxil, Xanax  -Continue aspirin  Admission status: Inpatient  IV Fluids: Hep-Lock  Diet/Nutrition: Heart healthy  Consults called: None  DVT Px: Lovenox, SCDs and early ambulation. Code Status: Full Code  Disposition Plan: To home in 1-2 days  All the records are reviewed and case discussed with ED provider. Management plans discussed with the patient and/or family who express understanding and agree with plan of care.  Kairav Russomanno D.O. on 11/01/2016 at 12:58 AM Between 7am to 6pm - Pager - [phone] After 6pm go to [email or web address] - Proofreader [facility] [place] [facility] Office [phone] CC: Primary care physician;  Juluis Pitch, MD   11/01/2016, 12:58 AM

## 2016-11-01 NOTE — ED Notes (Signed)
Neb tx completed. bilat exp wheezing present x4 lobes

## 2016-11-01 NOTE — ED Notes (Signed)
Pt assisted to toilet in room by Mallie Mussel, [place] . +1 ambulation necessary.

## 2016-11-01 NOTE — ED Notes (Signed)
Lisabeth Pick, Tech to transport at this time

## 2016-11-01 NOTE — ED Notes (Signed)
Report to Olivia Mackie, RN 1C. No questions reported at this time.

## 2016-11-01 NOTE — Evaluation (Signed)
Physical Therapy Evaluation Patient Details Name: Rose Irwin MRN: [id number] DOB: September 28, 1931 Today's Date: 11/01/2016   History of Present Illness  Pt is an 83 y.o. female presenting to hospital with SOB, coughing, and weakness.  Pt admitted to hospital with acute respiratory failure secondary to COPD exacerbation and acute pulmonary edema (? CHF).  PMH includes h/o R breast and thyroid CA, skin CA, COPD, htn, neuropathy, back surgery, lung sx, thyroid sx, and breast sx.  Clinical Impression  Prior to hospital admission, pt was modified independent ambulating with SPC.  Pt lives alone in 1 level home with 3-4 STE with B railings.  Currently pt is SBA with bed mobility and CGA to close SBA with transfers using an AD.  Pt unsteady ambulating and with loss of balance turning with use of SPC but pt appearing steady without loss of balance when switching to RW use during ambulation.  Pt's O2 decreased from (at rest) 95% on 2 L O2 via nasal cannula ambulating about 15 feet to 81% (O2 increased back to low 90's with sitting rest and initial increase of O2 to 3 L); pt ambulated about 15 feet back to bed and pt's O2 91% or greater on 3 L O2 via nasal cannula (O2 decreased back to 2 L at rest end of session with O2 sats 94%); nursing notified of above O2 sats and O2 needs during session.  Pt would benefit from skilled PT to address noted impairments and functional limitations.  Recommend pt discharge to home with use of RW for ambulation and SBA with functional mobility for safety when medically appropriate (pt's nephew's wife reports she will be staying with pt upon discharge to assist pt).    Follow Up Recommendations Home health PT;Supervision for mobility/OOB    Equipment Recommendations  Rolling walker with 5" wheels    Recommendations for Other Services       Precautions / Restrictions Precautions Precautions: Fall Restrictions Weight Bearing Restrictions: No      Mobility  Bed  Mobility Overal bed mobility: Needs Assistance Bed Mobility: Supine to Sit;Sit to Supine     Supine to sit: Supervision;HOB elevated Sit to supine: Supervision;HOB elevated   General bed mobility comments: mild increased effort but no physical assist required  Transfers Overall transfer level: Needs assistance Equipment used: Straight cane;Rolling walker (2 wheeled) Transfers: Sit to/from Stand Sit to Stand: Min guard;Supervision         General transfer comment: pt unsteady standing with SPC (CGA) but improved stability noted using RW (close SBA); no vc's required  Ambulation/Gait Ambulation/Gait assistance: Min guard;Mod assist Ambulation Distance (Feet):  (15 feet x2) Assistive device: Straight cane;Rolling walker (2 wheeled)   Gait velocity: decreased   General Gait Details: Pt unsteady and heavily using SPC walking towards doorway in room.  PT asked pt to turn to R instead of L and pt with significant loss of balance to R requiring mod assist to prevent fall (pt also leaning against wall for support).  Pt sat in chair by doorway to rest and recover O2 prior to ambulating back to bed (required increase of O2 to 3 L d/t O2 desaturation to 81% on 2 L; nursing notified).  CGA ambulating from door back to bed with RW (pt appearing very steady with use of RW).  Stairs Stairs:  (Not appropriate at this time d/t O2 desaturation with activity)          Wheelchair Mobility    Modified Rankin (Stroke Patients Only)  Balance Overall balance assessment: History of Falls;Needs assistance Sitting-balance support: No upper extremity supported;Feet supported Sitting balance-Leahy Scale: Good Sitting balance - Comments: dyanamic sitting reaching within BOS   Standing balance support: Bilateral upper extremity supported (on RW) Standing balance-Leahy Scale: Good Standing balance comment: ambulating with RW                             Pertinent Vitals/Pain  Pain Assessment: No/denies pain    Home Living Family/patient expects to be discharged to:: Private residence Living Arrangements: Alone Available Help at Discharge: Family Type of Home: House Home Access: Stairs to enter Entrance Stairs-Rails: Right;Left;Can reach both Technical brewer of Steps: 3-4 Home Layout: One level Home Equipment: Cane - single point (has RW from family member)      Prior Function Level of Independence: Independent         Comments: Pt denies any falls in past 6 months (although has had some LOB).  Per chart (+) smoking.  Pt reports losing her husband early this year and a friend last week.     Hand Dominance        Extremity/Trunk Assessment   Upper Extremity Assessment Upper Extremity Assessment: Generalized weakness    Lower Extremity Assessment Lower Extremity Assessment: Generalized weakness       Communication   Communication: HOH  Cognition Arousal/Alertness: Awake/alert Behavior During Therapy: WFL for tasks assessed/performed Overall Cognitive Status: Within Functional Limits for tasks assessed                                        General Comments General comments (skin integrity, edema, etc.): Pt's nephew's wife present during session and appearing supportive of pt.  Nursing cleared pt for participation in physical therapy and requesting pt stay on O2 for ambulation.  Pt agreeable to PT session.     Exercises  Ambulation; vc's for pursed lip breathing; O2 and RW needs education   Assessment/Plan    PT Assessment Patient needs continued PT services  PT Problem List Decreased strength;Decreased activity tolerance;Decreased balance;Decreased mobility;Decreased knowledge of use of DME;Cardiopulmonary status limiting activity       PT Treatment Interventions DME instruction;Gait training;Stair training;Functional mobility training;Therapeutic activities;Therapeutic exercise;Balance  training;Patient/family education    PT Goals (Current goals can be found in the Care Plan section)  Acute Rehab PT Goals Patient Stated Goal: to go home PT Goal Formulation: With patient Time For Goal Achievement: 11/15/16 Potential to Achieve Goals: Good    Frequency Min 2X/week   Barriers to discharge        Co-evaluation               End of Session Equipment Utilized During Treatment: Gait belt;Oxygen (2-3 L O2 via nasal cannula) Activity Tolerance: Patient limited by fatigue;Other (comment) (Limited d/t O2 desaturation with activity) Patient left: in bed;with call bell/phone within reach;with bed alarm set;with family/visitor present Nurse Communication: Mobility status;Precautions (O2 desaturation with activity and O2 needs) PT Visit Diagnosis: Unsteadiness on feet (R26.81);Muscle weakness (generalized) (M62.81);Difficulty in walking, not elsewhere classified (R26.2);History of falling (Z91.81)    Time: 3790-2409 PT Time Calculation (min) (ACUTE ONLY): 25 min   Charges:   PT Evaluation $PT Eval Low Complexity: 1 Procedure PT Treatments $Therapeutic Exercise: 8-22 mins   PT G CodesLeitha Bleak, PT 11/01/16,  10:56 AM [phone]

## 2016-11-01 NOTE — Progress Notes (Signed)
*  PRELIMINARY RESULTS* Echocardiogram 2D Echocardiogram has been performed.  Rose Irwin 11/01/2016, 3:33 PM

## 2016-11-02 ENCOUNTER — Inpatient Hospital Stay: Payer: Medicare Other

## 2016-11-02 LAB — BASIC METABOLIC PANEL
Anion gap: 9 (ref 5–15)
BUN: 22 mg/dL — AB (ref 6–20)
CALCIUM: 9.5 mg/dL (ref 8.9–10.3)
CO2: 35 mmol/L — ABNORMAL HIGH (ref 22–32)
Chloride: 90 mmol/L — ABNORMAL LOW (ref 101–111)
Creatinine, Ser: 0.83 mg/dL (ref 0.44–1.00)
GFR calc Af Amer: >60 mL/min (ref >60)
Glucose, Bld: 128 mg/dL — ABNORMAL HIGH (ref 65–99)
POTASSIUM: 3.8 mmol/L (ref 3.5–5.1)
SODIUM: 134 mmol/L — AB (ref 135–145)

## 2016-11-02 LAB — ECHOCARDIOGRAM COMPLETE
Height: 66 in
Weight: 2244.8 oz

## 2016-11-02 MED ORDER — DIPHENHYDRAMINE HCL 25 MG PO CAPS
25.0000 mg | ORAL_CAPSULE | Freq: Once | ORAL | Status: AC
  Administered 2016-11-02: 18:00:00 25 mg via ORAL
  Filled 2016-11-02: qty 1

## 2016-11-02 MED ORDER — METHYLPREDNISOLONE SODIUM SUCC 125 MG IJ SOLR: 60.0000 mg | Freq: Two times a day (BID) | INTRAMUSCULAR | Status: DC

## 2016-11-02 MED ORDER — POTASSIUM CHLORIDE CRYS ER 20 MEQ PO TBCR
20.0000 meq | EXTENDED_RELEASE_TABLET | Freq: Two times a day (BID) | ORAL | Status: DC
  Administered 2016-11-02 – 2016-11-03 (×3): 20 meq via ORAL
  Filled 2016-11-02 (×3): qty 1

## 2016-11-02 MED ORDER — AZITHROMYCIN 500 MG PO TABS
500.0000 mg | ORAL_TABLET | Freq: Every day | ORAL | Status: DC
  Administered 2016-11-03: 11:00:00 500 mg via ORAL
  Filled 2016-11-02: qty 1

## 2016-11-02 NOTE — Progress Notes (Signed)
[facility] [place] NAME: Rose Irwin    MR#:  [id number]  DATE OF BIRTH:  1931/11/02  SUBJECTIVE:  CHIEF COMPLAINT:   Chief Complaint  Patient presents with  . Shortness of Breath  . Weakness      Came with persistent respi distress- inspite of takig oral prednisone and Doxy for 2-3 days.   Also noted to have ankle edema.   Feels somewhat better today. Still very weak.  REVIEW OF SYSTEMS:  CONSTITUTIONAL: No fever,positive for fatigue or weakness.  EYES: No blurred or double vision.  EARS, NOSE, AND THROAT: No tinnitus or ear pain.  RESPIRATORY: No cough, positive for shortness of breath, wheezing ,no hemoptysis.  CARDIOVASCULAR: No chest pain, orthopnea, edema.  GASTROINTESTINAL: No nausea, vomiting, diarrhea or abdominal pain.  GENITOURINARY: No dysuria, hematuria.  ENDOCRINE: No polyuria, nocturia,  HEMATOLOGY: No anemia, easy bruising or bleeding SKIN: No rash or lesion. MUSCULOSKELETAL: No joint pain or arthritis.   NEUROLOGIC: No tingling, numbness, weakness.  PSYCHIATRY: No anxiety or depression.   ROS  DRUG ALLERGIES:   Allergies  Allergen Reactions  . Codeine Other (See Comments)    Go limp, sweat  . Sulfa Antibiotics Swelling    tongue  . Aspirin Palpitations    VITALS:  Blood pressure (!) 121/53, pulse 69, temperature 98.2 F (36.8 C), temperature source Oral, resp. rate 18, height 5\' 6"  (1.676 m), weight 68.2 kg (150 lb 6.4 oz), SpO2 93 %.  PHYSICAL EXAMINATION:  GENERAL:  83 y.o.-year-old patient lying in the bed with no acute distress.  EYES: Pupils equal, round, reactive to light and accommodation. No scleral icterus. Extraocular muscles intact.  HEENT: Head atraumatic, normocephalic. Oropharynx and nasopharynx clear.  NECK:  Supple, no jugular venous distention. No thyroid enlargement, no tenderness.  LUNGS: Normal breath sounds bilaterally, some wheezing in both lower lung fields, some crepitation. No  use of accessory muscles of respiration.  CARDIOVASCULAR: S1, S2 normal. No murmurs, rubs, or gallops.  ABDOMEN: Soft, nontender, nondistended. Bowel sounds present. No organomegaly or mass.  EXTREMITIES: some pedal edema,no cyanosis, or clubbing.  NEUROLOGIC: Cranial nerves II through XII are intact. Muscle strength 5/5 in all extremities. Sensation intact. Gait not checked.  PSYCHIATRIC: The patient is alert and oriented x 3.  SKIN: No obvious rash, lesion, or ulcer.   Physical Exam LABORATORY PANEL:   CBC  Recent Labs Lab 11/01/16 0313  WBC 8.2  HGB 13.3  HCT 40.4  PLT 165   ------------------------------------------------------------------------------------------------------------------  Chemistries   Recent Labs Lab 11/01/16 0313  NA 136  K 3.3*  CL 94*  CO2 35*  GLUCOSE 162*  BUN 12  CREATININE 0.75  CALCIUM 9.0   ------------------------------------------------------------------------------------------------------------------  Cardiac Enzymes  Recent Labs Lab 11/01/16 0839 11/01/16 1507  TROPONINI <0.03 <0.03   ------------------------------------------------------------------------------------------------------------------  RADIOLOGY:  Dg Chest Portable 1 View  Result Date: 10/31/2016 CLINICAL DATA:  Acute onset of shortness of breath with exertion. Initial encounter. EXAM: PORTABLE CHEST 1 VIEW COMPARISON:  Chest radiograph from 09/14/2013 FINDINGS: The lungs are well-aerated. Mild vascular congestion is noted. Increased interstitial markings raise question for minimal interstitial edema. There is no evidence of pleural effusion or pneumothorax. The cardiomediastinal silhouette is enlarged. No acute osseous abnormalities are seen. Clips are noted overlying the left supraclavicular region. IMPRESSION: Mild vascular congestion and cardiomegaly. Increased interstitial markings raise question for minimal interstitial edema. Electronically Signed   By: Garald Balding M.D.   On: 10/31/2016 21:53  ASSESSMENT AND PLAN:   Active Problems:   Acute respiratory failure with hypoxia (HCC)  #. Acute respiratory failure secondary to exacerbation of COPD and new onset of pulmonary edema questionable CHF  #. Acute exacerbation of COPD - IV steroids - Nebulizers, O2 therapy and expectorants as needed.  - negative flu swab and sputum culture -Continued Dulera for Advair - encouraged incentive spirometer.  #. Acute pulmonary edema / ?congestive heart failure - Telemetry monitoring. - Continue beta blocker/ARB, on HCTz - Intake/output, daily weight. - Trend troponins, checked lipids and TSH. - Echo- pending report.  #. Tobacco use disorder, continuous -Cessation advised for 4 min. -Nicotine patch as needed  #. History of hypertension -Continue Lopressor, Cozaar  #. History of hyperlipidemia -Continue Zocor  #. History of history of hypothyroidism -Continue Synthroid  #. History of depression/anxiety - Continue Paxil, Xanax  -Continue aspirin     All the records are reviewed and case discussed with Care Management/Social Workerr. Management plans discussed with the patient, family and they are in agreement.  CODE STATUS: Full.  TOTAL TIME TAKING CARE OF THIS PATIENT: 35 minutes.   POSSIBLE D/C IN 1-2 DAYS, DEPENDING ON CLINICAL CONDITION.   Vaughan Basta M.D on 11/02/2016   Between 7am to 6pm - Pager - [phone]  After 6pm go to [email or web address] - password [email or web address] [facility]  Office  [phone]  CC: Primary care physician; Juluis Pitch, MD  Note: This dictation was prepared with Dragon dictation along with smaller phrase technology. Any transcriptional errors that result from this process are unintentional.

## 2016-11-02 NOTE — Progress Notes (Signed)
PHARMACIST - PHYSICIAN COMMUNICATION DR:   [place] CONCERNING: Antibiotic IV to Oral Route Change Policy  RECOMMENDATION: This patient is receiving Azithromycin by the intravenous route.  Based on criteria approved by the Pharmacy and Therapeutics Committee, the antibiotic(s) is/are being converted to the equivalent oral dose form(s).   DESCRIPTION: These criteria include:  Patient being treated for a respiratory tract infection, urinary tract infection, cellulitis or clostridium difficile associated diarrhea if on metronidazole  The patient is not neutropenic and does not exhibit a GI malabsorption state  The patient is eating (either orally or via tube) and/or has been taking other orally administered medications for a least 24 hours  The patient is improving clinically and has a Tmax < 100.5  If you have questions about this conversion, please contact the Pharmacy Department  []   [phone] )  [facility] []   [phone] )  [facility] []   [phone] )  [facility] []   [phone] )  [facility] []   [phone] )  [facility]

## 2016-11-02 NOTE — Care Management (Signed)
Admitted to this facility with the diagnosis of acute respiratory failure. Last seen Dr, Lovie Macadamia on Monday. Lives alone, Sister is Apolonio Schneiders [phone]).  Prescriptions are filled at [facility]. No home Health. No skilled facility, No home oxygen. Rolling walker and cane in the home. Takes care of all basic activities of daily living  Herself, drives. No falls lately. Appetite was good until coming to hospital. Sister will transport. Physical therapy evaluation completed. Recommending Home Health and Physical therapy in the home. Discussed with Ms. Dehner. No preference Possible discharge over the weekend per Dr, Devoria Glassing RN MSN [place] Management [phone]

## 2016-11-02 NOTE — Discharge Instructions (Signed)
Heart Failure Clinic appointment on November 09, 2016 at 12:00pm with Darylene Price, [place]. Please call [phone] to reschedule.

## 2016-11-03 MED ORDER — AZITHROMYCIN 500 MG PO TABS: 500.0000 mg | ORAL_TABLET | Freq: Every day | ORAL | 0 refills | Status: AC

## 2016-11-03 MED ORDER — PREDNISONE 50 MG PO TABS
50.0000 mg | ORAL_TABLET | Freq: Once | ORAL | Status: AC
  Administered 2016-11-03: 11:00:00 50 mg via ORAL
  Filled 2016-11-03: qty 1

## 2016-11-03 NOTE — Progress Notes (Signed)
MD order to discharge pt home.  VSS.  Pt discharged home on O2 via Nasal Cannula. Discharge instructions reviewed with pt, pt verbalized understanding.  IV D/C'd.  Pt discharged via wheelchair to home.  Portable O2 tank from [facility] sent with pt.

## 2016-11-03 NOTE — Discharge Summary (Signed)
[facility] NAME: Rose Irwin    MR#:  [id number]  DATE OF BIRTH:  06/14/1932  DATE OF ADMISSION:  10/31/2016 ADMITTING PHYSICIAN: Harvie Bridge, DO  DATE OF DISCHARGE: 11/03/2016  PRIMARY CARE PHYSICIAN: Juluis Pitch, MD    ADMISSION DIAGNOSIS:  Acute pulmonary edema ([place]) [J81.0] COPD exacerbation ([place]) [J44.1] Acute respiratory failure with hypoxia ([place]) [J96.01]  DISCHARGE DIAGNOSIS:  Active Problems:   Acute respiratory failure with hypoxia ([place])   SECONDARY DIAGNOSIS:   Past Medical History:  Diagnosis Date  . Anxiety   . Arthritis   . Breast cancer ([place]) 11/21/2011   right breast cancer - radiation  . Bronchitis   . Cancer [facility])    breast, thyroid  . Complication of anesthesia    smothering  . COPD (chronic obstructive pulmonary disease) ([place])   . Depression   . Elevated lipids   . GERD (gastroesophageal reflux disease)   . Hemorrhoids   . Hypertension   . Hypothyroidism   . Neuropathy    rt leg  . Osteopenia   . Skin cancer     HOSPITAL COURSE:   #. Acute respiratory failure secondary to exacerbation of COPD and new onset of pulmonary edema questionable CHF  #. Acute exacerbation of COPD - IV steroids - Nebulizers, O2 therapy and expectorants as needed.  - negative flu swab and sputum culture -Continued Dulera for Advair - encouraged incentive spirometer. - feeling better, switch to oral steroids on d/c.  #. Acute pulmonary edema / ac diastolic congestive heart failure - Telemetry monitoring. - Continue beta blocker/ARB, on HCTz - Intake/output, daily weight. - Trend troponins, checked lipids and TSH. - Echo- reviewed report. - Improved.  #. Tobacco use disorder, continuous -Cessation advised for 4 min. -Nicotine patch as needed  #. History of hypertension -Continue Lopressor, Cozaar  #. History of hyperlipidemia -Continue Zocor  #. History of history of  hypothyroidism -Continue Synthroid  #. History of depression/anxiety - Continue Paxil, Xanax  -Continue aspirin  DISCHARGE CONDITIONS:   Stable.  CONSULTS OBTAINED:    DRUG ALLERGIES:   Allergies  Allergen Reactions  . Codeine Other (See Comments)    Go limp, sweat  . Sulfa Antibiotics Swelling    tongue  . Aspirin Palpitations    DISCHARGE MEDICATIONS:   Current Discharge Medication List    START taking these medications   Details  azithromycin (ZITHROMAX) 500 MG tablet Take 1 tablet (500 mg total) by mouth daily. Qty: 2 tablet, Refills: 0      CONTINUE these medications which have NOT CHANGED   Details  albuterol (PROAIR HFA) 108 (90 Base) MCG/ACT inhaler Inhale 2 puffs into the lungs every 4 (four) hours as needed for wheezing or shortness of breath. Qty: 1 Inhaler, Refills: 1    albuterol-ipratropium (COMBIVENT) 18-103 MCG/ACT inhaler Inhale 1 puff into the lungs every 4 (four) hours as needed for wheezing or shortness of breath.    ALPRAZolam (XANAX) 0.5 MG tablet TAKE ONE TABLET TWICE DAILY AS NEEDED FOR ANXIETY Qty: 60 tablet, Refills: 0    aspirin EC 81 MG tablet Take 81 mg by mouth daily.    Fluticasone-Salmeterol (ADVAIR DISKUS) 250-50 MCG/DOSE AEPB Inhale into the lungs.    levothyroxine (SYNTHROID, LEVOTHROID) 88 MCG tablet Take 1 tablet (88 mcg total) by mouth daily before breakfast. Qty: 30 tablet, Refills: 1    losartan-hydrochlorothiazide (HYZAAR) 50-12.5 MG tablet Take 1 tablet by mouth daily.  metoprolol (LOPRESSOR) 50 MG tablet Take 50 mg by mouth 2 (two) times daily.    PARoxetine (PAXIL) 20 MG tablet Take 30 mg by mouth daily.     predniSONE (DELTASONE) 20 MG tablet 1 tab po qd for 3 days, then half a tab po qd for 3 days Qty: 6 tablet, Refills: 0    simvastatin (ZOCOR) 20 MG tablet Take 20 mg by mouth daily at 6 PM.     traZODone (DESYREL) 50 MG tablet Take 25 mg by mouth at bedtime as needed for sleep.    triamcinolone  ointment (KENALOG) 0.1 % Reported on 01/11/2016   Associated Diagnoses: Thyroid cancer ([place]); Dysphagia      STOP taking these medications     COMBIVENT RESPIMAT 20-100 MCG/ACT AERS respimat      doxycycline (VIBRA-TABS) 100 MG tablet          DISCHARGE INSTRUCTIONS:    Follow with PMD in 1 week.  If you experience worsening of your admission symptoms, develop shortness of breath, life threatening emergency, suicidal or homicidal thoughts you must seek medical attention immediately by calling 911 or calling your MD immediately  if symptoms less severe.  You Must read complete instructions/literature along with all the possible adverse reactions/side effects for all the Medicines you take and that have been prescribed to you. Take any new Medicines after you have completely understood and accept all the possible adverse reactions/side effects.   Please note  You were cared for by a hospitalist during your hospital stay. If you have any questions about your discharge medications or the care you received while you were in the hospital after you are discharged, you can call the unit and asked to speak with the hospitalist on call if the hospitalist that took care of you is not available. Once you are discharged, your primary care physician will handle any further medical issues. Please note that NO REFILLS for any discharge medications will be authorized once you are discharged, as it is imperative that you return to your primary care physician (or establish a relationship with a primary care physician if you do not have one) for your aftercare needs so that they can reassess your need for medications and monitor your lab values.    Today   CHIEF COMPLAINT:   Chief Complaint  Patient presents with  . Shortness of Breath  . Weakness    HISTORY OF PRESENT ILLNESS:  Rose Irwin  is a 83 y.o. female with a known history of anxiety, depression, breast cancer, COPD with active smoking,  GERD, hypertension, hyperlipidemia, hypothyroidism presents to the emergency department for evaluation of breath.  Patient was in a usual state of health until 3 days prior to arrival when she describes the onset of weakness, fatigue, cough productive of yellow sputum, fevers and chills and decreased appetite. She was seen by urgent care and started on a course of doxycycline and prednisone for presumed bronchitis however his symptoms persisted despite using the medications along with home nebulizers.  VITAL SIGNS:  Blood pressure 137/61, pulse 67, temperature 97.7 F (36.5 C), temperature source Oral, resp. rate 18, height 5\' 6"  (1.676 m), weight 65.5 kg (144 lb 6.4 oz), SpO2 99 %.  I/O:   Intake/Output Summary (Last 24 hours) at 11/03/16 0959 Last data filed at 11/02/16 1353  Gross per 24 hour  Intake              120 ml  Output  0 ml  Net              120 ml    PHYSICAL EXAMINATION:   GENERAL:  83 y.o.-year-old patient lying in the bed with no acute distress.  EYES: Pupils equal, round, reactive to light and accommodation. No scleral icterus. Extraocular muscles intact.  HEENT: Head atraumatic, normocephalic. Oropharynx and nasopharynx clear.  NECK:  Supple, no jugular venous distention. No thyroid enlargement, no tenderness.  LUNGS: Normal breath sounds bilaterally, some wheezing in both lower lung fields, some crepitation. No use of accessory muscles of respiration.  CARDIOVASCULAR: S1, S2 normal. No murmurs, rubs, or gallops.  ABDOMEN: Soft, nontender, nondistended. Bowel sounds present. No organomegaly or mass.  EXTREMITIES: some pedal edema,no cyanosis, or clubbing.  NEUROLOGIC: Cranial nerves II through XII are intact. Muscle strength 5/5 in all extremities. Sensation intact. Gait not checked.  PSYCHIATRIC: The patient is alert and oriented x 3.  SKIN: No obvious rash, lesion, or ulcer.   DATA REVIEW:   CBC  Recent Labs Lab 11/01/16 0313  WBC 8.2  HGB 13.3   HCT 40.4  PLT 165    Chemistries   Recent Labs Lab 11/02/16 0955  NA 134*  K 3.8  CL 90*  CO2 35*  GLUCOSE 128*  BUN 22*  CREATININE 0.83  CALCIUM 9.5    Cardiac Enzymes  Recent Labs Lab 11/01/16 1507  TROPONINI <0.03    Microbiology Results  Results for orders placed or performed in visit on 09/14/13  Culture, blood (single)     Status: None   Collection Time: 09/14/13  3:26 AM  Result Value Ref Range Status   Micro Text Report   Final       COMMENT                   NO GROWTH AEROBICALLY/ANAEROBICALLY IN 5 DAYS   ANTIBIOTIC                                                      Culture, blood (single)     Status: None   Collection Time: 09/14/13  3:26 AM  Result Value Ref Range Status   Micro Text Report   Final       COMMENT                   NO GROWTH AEROBICALLY/ANAEROBICALLY IN 5 DAYS   ANTIBIOTIC                                                      Influenza A&B Antigens [facility])     Status: None   Collection Time: 09/14/13 10:22 AM  Result Value Ref Range Status   Micro Text Report   Final       SOURCE: SWAB    COMMENT                   NEGATIVE FOR INFLUENZA A (ANTIGEN ABSENT)   COMMENT                   NEGATIVE FOR INFLUENZA B (ANTIGEN ABSENT)   ANTIBIOTIC  Culture, expectorated sputum-assessment     Status: None   Collection Time: 09/14/13  6:16 PM  Result Value Ref Range Status   Micro Text Report   Final       COMMENT                   APPEARS TO BE NORMAL FLORA AT 79 HOURS   COMMENT                   ORIGINAL GRAM STAIN REVIEWED ON 09-17-13   GRAM STAIN                MANY WHITE BLOOD CELLS   GRAM STAIN                MANY GRAM POSITIVE COCCI MANY GRAM NEGATIVE ROD   GRAM STAIN                MODERATE GRAM POSITIVE ROD   GRAM STAIN                FEW GRAM NEGATIVE DIPLOCOCCI   ANTIBIOTIC                                                        RADIOLOGY:  Dg Chest 1  View  Result Date: 11/02/2016 CLINICAL DATA:  Hypertension.  Recent pulmonary vascular congestion EXAM: CHEST 1 VIEW COMPARISON:  October 31, 2016 FINDINGS: There is cardiomegaly with mild pulmonary venous hypertension. No edema or consolidation. There is aortic atherosclerosis. No adenopathy. There are surgical clips in the left supraclavicular region. No bone lesions evident. IMPRESSION: Pulmonary vascular congestion persists without edema or consolidation. There is aortic atherosclerosis. Electronically Signed   By: Lowella Grip III M.D.   On: 11/02/2016 09:47    EKG:   Orders placed or performed during the hospital encounter of 10/31/16  . ED EKG  . ED EKG  . EKG 12-Lead  . EKG 12-Lead  . EKG 12-Lead      Management plans discussed with the patient, family and they are in agreement.  CODE STATUS:     Code Status Orders        Start     Ordered   11/01/16 0257  Full code  Continuous     11/01/16 0257    Code Status History    Date Active Date Inactive Code Status Order ID Comments User Context   This patient has a current code status but no historical code status.      TOTAL TIME TAKING CARE OF THIS PATIENT: 35 minutes.    Vaughan Basta M.D on 11/03/2016 at 9:59 AM  Between 7am to 6pm - Pager - [phone]  After 6pm go to [email or web address] - password [email or web address] [facility]  Office  [phone]  CC: Primary care physician; Juluis Pitch, MD   Note: This dictation was prepared with Dragon dictation along with smaller phrase technology. Any transcriptional errors that result from this process are unintentional.

## 2016-11-03 NOTE — Care Management Note (Signed)
Case Management Note  Patient Details  Name: Rose Irwin MRN: [id number] Date of Birth: [birth year]  Subjective/Objective:   Referral for new home oxygen called to [facility] at [facility]. A portable 02 tank will be delivered to Rose Irwin [facility] room  123 today by [employer] and new home oxygen set up delivered to her home. HH PT and Aide was called to Ardeen Fillers at [place] which accepts [employer].               Action/Plan:   Expected Discharge Date:  11/03/16               Expected Discharge Plan:     In-House Referral:     Discharge planning Services     Post Acute Care Choice:    Choice offered to:     DME Arranged:    DME Agency:     HH Arranged:    HH Agency:     Status of Service:     If discussed at [employer], dates discussed:    Additional Comments:  Wlliam Grosso A, RN 11/03/2016, 12:21 PM

## 2016-11-03 NOTE — Care Management Note (Signed)
Case Management Note  Patient Details  Name: Rose Irwin MRN: [id number] Date of Birth: 1931-12-21  Subjective/Objective:        Referral for HH-PT called to Ardeen Fillers at [place] at home. No other needs identified. Has home DME already.             Action/Plan:   Expected Discharge Date:  11/03/16               Expected Discharge Plan:     In-House Referral:     Discharge planning Services     Post Acute Care Choice:    Choice offered to:     DME Arranged:    DME Agency:     HH Arranged:    HH Agency:     Status of Service:     If discussed at [employer], dates discussed:    Additional Comments:  Tydarius Yawn A, RN 11/03/2016, 10:43 AM

## 2016-11-03 NOTE — Progress Notes (Signed)
O2 at rest 1L - 96% O2 at rest room air - 76% O2 at rest 1L - 96%  Dr. Anselm Jungling notified.  Pt will go home with O2.

## 2016-11-06 ENCOUNTER — Emergency Department
Admission: EM | Admit: 2016-11-06 | Discharge: 2016-11-06 | Disposition: A | Discharge status: Home / Self Care | Payer: Medicare Other | Attending: Emergency Medicine | Admitting: Emergency Medicine

## 2016-11-06 ENCOUNTER — Emergency Department: Payer: Medicare Other

## 2016-11-06 ENCOUNTER — Encounter: Payer: Self-pay | Admitting: Emergency Medicine

## 2016-11-06 DIAGNOSIS — Z79899 Other long term (current) drug therapy: Secondary | ICD-10-CM | POA: Insufficient documentation

## 2016-11-06 DIAGNOSIS — Z87891 Personal history of nicotine dependence: Secondary | ICD-10-CM | POA: Diagnosis not present

## 2016-11-06 DIAGNOSIS — C73 Malignant neoplasm of thyroid gland: Secondary | ICD-10-CM | POA: Insufficient documentation

## 2016-11-06 DIAGNOSIS — Z853 Personal history of malignant neoplasm of breast: Secondary | ICD-10-CM | POA: Diagnosis not present

## 2016-11-06 DIAGNOSIS — E039 Hypothyroidism, unspecified: Secondary | ICD-10-CM | POA: Diagnosis not present

## 2016-11-06 DIAGNOSIS — I10 Essential (primary) hypertension: Secondary | ICD-10-CM | POA: Insufficient documentation

## 2016-11-06 DIAGNOSIS — R531 Weakness: Secondary | ICD-10-CM

## 2016-11-06 DIAGNOSIS — Z7982 Long term (current) use of aspirin: Secondary | ICD-10-CM | POA: Insufficient documentation

## 2016-11-06 DIAGNOSIS — J449 Chronic obstructive pulmonary disease, unspecified: Secondary | ICD-10-CM

## 2016-11-06 DIAGNOSIS — Z85828 Personal history of other malignant neoplasm of skin: Secondary | ICD-10-CM | POA: Insufficient documentation

## 2016-11-06 LAB — URINALYSIS, COMPLETE (UACMP) WITH MICROSCOPIC
Bacteria, UA: NONE SEEN
Bilirubin Urine: NEGATIVE
GLUCOSE, UA: NEGATIVE mg/dL
KETONES UR: 5 mg/dL — AB
Leukocytes, UA: NEGATIVE
NITRITE: NEGATIVE
PH: 6 (ref 5.0–8.0)
PROTEIN: NEGATIVE mg/dL
Specific Gravity, Urine: 1.019 (ref 1.005–1.030)
Squamous Epithelial / LPF: NONE SEEN

## 2016-11-06 LAB — CBC
HCT: 46.2 % (ref 35.0–47.0)
HEMOGLOBIN: 15.5 g/dL (ref 12.0–16.0)
MCH: 27.6 pg (ref 26.0–34.0)
MCHC: 33.4 g/dL (ref 32.0–36.0)
MCV: 82.5 fL (ref 80.0–100.0)
PLATELETS: 212 10*3/uL (ref 150–440)
RBC: 5.6 MIL/uL — AB (ref 3.80–5.20)
RDW: 13.3 % (ref 11.5–14.5)
WBC: 9.5 10*3/uL (ref 3.6–11.0)

## 2016-11-06 LAB — BASIC METABOLIC PANEL
ANION GAP: 7 (ref 5–15)
BUN: 20 mg/dL (ref 6–20)
CALCIUM: 9.4 mg/dL (ref 8.9–10.3)
CO2: 40 mmol/L — ABNORMAL HIGH (ref 22–32)
CREATININE: 0.67 mg/dL (ref 0.44–1.00)
Chloride: 86 mmol/L — ABNORMAL LOW (ref 101–111)
Glucose, Bld: 124 mg/dL — ABNORMAL HIGH (ref 65–99)
Potassium: 3.5 mmol/L (ref 3.5–5.1)
SODIUM: 133 mmol/L — AB (ref 135–145)

## 2016-11-06 MED ORDER — IPRATROPIUM-ALBUTEROL 0.5-2.5 (3) MG/3ML IN SOLN
3.0000 mL | Freq: Once | RESPIRATORY_TRACT | Status: AC
  Administered 2016-11-06: 3 mL via RESPIRATORY_TRACT
  Filled 2016-11-06: qty 3

## 2016-11-06 MED ORDER — ONDANSETRON 4 MG PO TBDP
4.0000 mg | ORAL_TABLET | Freq: Once | ORAL | Status: AC
  Administered 2016-11-06: 4 mg via ORAL

## 2016-11-06 MED ORDER — ONDANSETRON 4 MG PO TBDP
ORAL_TABLET | ORAL | Status: AC
  Administered 2016-11-06: 4 mg via ORAL
  Filled 2016-11-06: qty 1

## 2016-11-06 NOTE — ED Notes (Signed)
Patient transported to CT 

## 2016-11-06 NOTE — ED Notes (Signed)
Pt c/o nausea and pt arrived with personal O2 tank, order for zofran initiated and new O2 tank applied while pt is in the [place].Marland Kitchen

## 2016-11-06 NOTE — ED Notes (Signed)
Pt in triage rm without nasal cannula on. Pt O2 was 91% on room air, once nasal cannula placed back on O2 level went up to 96% on 2L.

## 2016-11-06 NOTE — ED Triage Notes (Signed)
Recently admitted to hospital, discharged on Saturday.  Patient was discharged on 2L/[place].  Son and patient do not know why patient was in the hospital and why she was discharged on oxygen.  Patient states she was discharged from hospital on Saturday and was feeling as she is today at that time-- c/o feeling "spacey and out of it".  Son describes dyspnea on exertion  and generalized weakness.

## 2016-11-06 NOTE — ED Notes (Signed)
Pt states she was in the hospital last week with COPD flair and since her discharge she feels like she has gotten worse - she went to PCP Monday and was told she had bronchitis - pt was discharged with oxygen and no one knows why she was started on it - pt is on 2L cont. At home and still becomes short of breath when she walks from from one room to the other at home - pt has a congested cough and is c/o feeling weak - she also states that she has numbness in her hands, feet, and face off and on - pt reports that "this crazy feeling" started when she started the medication in the hospital

## 2016-11-06 NOTE — ED Provider Notes (Signed)
[facility] Emergency Department Provider Note  ____________________________________________   First MD Initiated Contact with Patient 11/06/16 1622     (approximate)  I have reviewed the triage vital signs and the nursing notes.   HISTORY  Chief Complaint Weakness    HPI Rose Irwin is a 83 y.o. female who comes to the emergency department reporting about 3 days of generalized weakness and a sense of unease after being discharged from the hospital for a COPD exacerbation. She is also concerned because after that exacerbation she was sent home on 2 L of oxygen and she is never taken oxygen before and she doesn't understand why she is on it. She was given prednisone but is no longer taking it. She also took azithromycin. She reports no worsening shortness of breath. No fevers or chills. No chest pain or shortness of breath. No abdominal pain nausea or vomiting. She does feel generally jittery and not like herself. Her son said that she has had intermittent episodes of decreased memory.   Past Medical History:  Diagnosis Date  . Anxiety   . Arthritis   . Breast cancer ([place]) 11/21/2011   right breast cancer - radiation  . Bronchitis   . Cancer [facility])    breast, thyroid  . Complication of anesthesia    smothering  . COPD (chronic obstructive pulmonary disease) ([place])   . Depression   . Elevated lipids   . GERD (gastroesophageal reflux disease)   . Hemorrhoids   . Hypertension   . Hypothyroidism   . Neuropathy    rt leg  . Osteopenia   . Skin cancer     Patient Active Problem List   Diagnosis Date Noted  . Acute respiratory failure with hypoxia ([place]) 11/01/2016  . Dysphagia 11/02/2015  . Skin cancer of nose 05/04/2015  . Esophageal stricture 05/04/2015  . Palpitations 01/18/2015  . Shortness of breath 01/17/2015  . COPD, mild ([place]) 01/03/2015  . Acquired hypothyroidism 10/28/2013  . Arthritis 10/28/2013  . Benign essential hypertension  10/28/2013  . GERD (gastroesophageal reflux disease) 10/28/2013  . History of hemorrhoids 10/28/2013  . History of nephrolithiasis 10/28/2013  . History of thyroid cancer 10/28/2013  . Keratoacanthoma 10/28/2013  . Major depression, chronic 10/28/2013  . Mixed hyperlipidemia 10/28/2013  . Osteopenia 10/28/2013  . Breast cancer ([place]) 10/22/2011  . Thyroid cancer ([place]) 07/23/1997    Past Surgical History:  Procedure Laterality Date  . ABDOMINAL HYSTERECTOMY    . BACK SURGERY     lumbar  . bladder polyps    . BREAST SURGERY Right   . CYSTOSCOPY W/ RETROGRADES Bilateral 02/09/2015   Procedure: CYSTOSCOPY WITH RETROGRADE PYELOGRAM;  Surgeon: Hollice Espy, MD;  Location: [facility] ORS;  Service: Urology;  Laterality: Bilateral;  . LUNG SURGERY    . THYROID SURGERY      Prior to Admission medications   Medication Sig Start Date End Date Taking? Authorizing Provider  albuterol (PROAIR HFA) 108 (90 Base) MCG/ACT inhaler Inhale 2 puffs into the lungs every 4 (four) hours as needed for wheezing or shortness of breath. 10/30/16   Norval Gable, MD  albuterol-ipratropium (COMBIVENT) 18-103 MCG/ACT inhaler Inhale 1 puff into the lungs every 4 (four) hours as needed for wheezing or shortness of breath.    Historical Provider, MD  ALPRAZolam Duanne Moron) 0.5 MG tablet TAKE ONE TABLET TWICE DAILY AS NEEDED FOR ANXIETY 03/11/15   Lequita Asal, MD  aspirin EC 81 MG tablet Take 81 mg by mouth  daily.    Historical Provider, MD  Fluticasone-Salmeterol (ADVAIR DISKUS) 250-50 MCG/DOSE AEPB Inhale into the lungs. 01/03/15   Historical Provider, MD  levothyroxine (SYNTHROID, LEVOTHROID) 88 MCG tablet Take 1 tablet (88 mcg total) by mouth daily before breakfast. 10/08/16 10/08/17  Lequita Asal, MD  losartan-hydrochlorothiazide (HYZAAR) 50-12.5 MG tablet Take 1 tablet by mouth daily.     Historical Provider, MD  metoprolol (LOPRESSOR) 50 MG tablet Take 50 mg by mouth 2 (two) times daily.    Historical Provider,  MD  PARoxetine (PAXIL) 20 MG tablet Take 30 mg by mouth daily.     Historical Provider, MD  predniSONE (DELTASONE) 20 MG tablet 1 tab po qd for 3 days, then half a tab po qd for 3 days 10/30/16   Norval Gable, MD  simvastatin (ZOCOR) 20 MG tablet Take 20 mg by mouth daily at 6 PM.     Historical Provider, MD  traZODone (DESYREL) 50 MG tablet Take 25 mg by mouth at bedtime as needed for sleep.    Historical Provider, MD  triamcinolone ointment (KENALOG) 0.1 % Reported on 01/11/2016 01/09/16   Historical Provider, MD    Allergies Codeine; Sulfa antibiotics; and Aspirin  Family History  Problem Relation Age of Onset  . Breast cancer Mother   . Breast cancer Sister   . Breast cancer Sister     Social History Social History  Substance Use Topics  . Smoking status: Former Smoker    Packs/day: 7.00    Years: 30.00    Types: Cigarettes    Quit date: 10/20/2014  . Smokeless tobacco: Never Used  . Alcohol use No    Review of Systems Constitutional: No fever/chills Eyes: No visual changes. ENT: No sore throat. Cardiovascular: Denies chest pain. Respiratory: Denies shortness of breath. Gastrointestinal: No abdominal pain.  No nausea, no vomiting.  No diarrhea.  No constipation. Genitourinary: Negative for dysuria. Musculoskeletal: Negative for back pain. Skin: Negative for rash. Neurological: Negative for headaches, focal weakness or numbness.  10-point ROS otherwise negative.  ____________________________________________   PHYSICAL EXAM:  VITAL SIGNS: ED Triage Vitals  Enc Vitals Group     BP 11/06/16 1354 (!) 109/57     Pulse --      Resp 11/06/16 1354 20     Temp 11/06/16 1354 99.1 F (37.3 C)     Temp Source 11/06/16 1354 Oral     SpO2 11/06/16 1354 91 %     Weight 11/06/16 1357 144 lb (65.3 kg)     Height 11/06/16 1357 5\' 5"  (1.651 m)     Head Circumference --      Peak Flow --      Pain Score 11/06/16 1407 0     Pain Loc --      Pain Edu? --      Excl. in [place]?  --     Constitutional: Alert and oriented x 4 well appearing nontoxic no diaphoresis speaks in full, clear sentences Eyes: PERRL EOMI. Head: Atraumatic. Nose: No congestion/rhinnorhea. Mouth/Throat: No trismus Neck: No stridor.   Cardiovascular: Normal rate, regular rhythm. Grossly normal heart sounds.  Good peripheral circulation. Respiratory: Normal respiratory effort lungs are clear and she is moving good air Gastrointestinal: Soft nondistended nontender no rebound no guarding no peritonitis no McBurney's tenderness negative Rovsing's no costovertebral tenderness negative Murphy's Musculoskeletal: No lower extremity edema   Neurologic:  Normal speech and language. No gross focal neurologic deficits are appreciated. Skin:  Skin is warm, dry and intact.  No rash noted. Psychiatric: Mood and affect are normal. Speech and behavior are normal.    _________________________________________   LABS (all labs ordered are listed, but only abnormal results are displayed)  Labs Reviewed  BASIC METABOLIC PANEL - Abnormal; Notable for the following:       Result Value   Sodium 133 (*)    Chloride 86 (*)    CO2 40 (*)    Glucose, Bld 124 (*)    All other components within normal limits  CBC - Abnormal; Notable for the following:    RBC 5.60 (*)    All other components within normal limits  URINALYSIS, COMPLETE (UACMP) WITH MICROSCOPIC - Abnormal; Notable for the following:    Color, Urine YELLOW (*)    APPearance CLEAR (*)    Hgb urine dipstick SMALL (*)    Ketones, ur 5 (*)    All other components within normal limits  CBG MONITORING, ED    No evidence of UTI labs unremarkable __________________________________________  EKG  ED ECG REPORT I, Darel Hong, the attending physician, personally viewed and interpreted this ECG.  Date: 11/06/2016 Rate: 58 Rhythm: Times bradycardia QRS Axis: normal Intervals: normal ST/T Wave abnormalities: normal Conduction Disturbances:  none Narrative Interpretation: abnormal  ____________________________________________  RADIOLOGY  Chest x-ray with no acute disease head CT with no acute disease ____________________________________________   PROCEDURES  Procedure(s) performed: no  Procedures  Critical Care performed: no  ____________________________________________   INITIAL IMPRESSION / ASSESSMENT AND PLAN / ED COURSE  Pertinent labs & imaging results that were available during my care of the patient were reviewed by me and considered in my medical decision making (see chart for details).  The patient arrives very well-appearing with clear lungs. I had a lengthy discussion with the patient and her son regarding her symptoms and that they may be related to high-dose steroids that she received while in the hospital. Given her new memory lapses and a sense of unease I obtained a head CT which is fortunately negative for acute pathology. The patient and her son are primarily concerned about her home oxygen and we discussed at length that this may not be permanent but that many people with COPD duty lifelong oxygen. The patient is still a smoker in spite of encouraged her to stop smoking. At this point she would like to leave and she is discharged home in improved and good condition.      ____________________________________________   FINAL CLINICAL IMPRESSION(S) / ED DIAGNOSES  Final diagnoses:  Weakness  Chronic obstructive pulmonary disease, unspecified COPD type ([place])      NEW MEDICATIONS STARTED DURING THIS VISIT:  Discharge Medication List as of 11/06/2016  7:01 PM       Note:  This document was prepared using Dragon voice recognition software and may include unintentional dictation errors.     Darel Hong, MD 11/06/16 2025

## 2016-11-06 NOTE — Discharge Instructions (Signed)
Please make an appointment to see her primary care physician or his 43 assistant later this week for recheck. I recommend that you continue your home oxygen until you can have a discussion with her primary physician. Return to the emergency department for any concerns such as chest pain, shortness of breath, worsening fatigue, or for any other concerns whatsoever.  It was a pleasure to take care of you today, and thank you for coming to our emergency department.  If you have any questions or concerns before leaving please ask the nurse to grab me and I'm more than happy to go through your aftercare instructions again.  If you were prescribed any opioid pain medication today such as Norco, Vicodin, Percocet, morphine, hydrocodone, or oxycodone please make sure you do not drive when you are taking this medication as it can alter your ability to drive safely.  If you have any concerns once you are home that you are not improving or are in fact getting worse before you can make it to your follow-up appointment, please do not hesitate to call 911 and come back for further evaluation.  Darel Hong MD  Results for orders placed or performed during the hospital encounter of 83/15/17  Basic metabolic panel  Result Value Ref Range   Sodium 133 (L) 135 - 145 mmol/L   Potassium 3.5 3.5 - 5.1 mmol/L   Chloride 86 (L) 101 - 111 mmol/L   CO2 40 (H) 22 - 32 mmol/L   Glucose, Bld 124 (H) 65 - 99 mg/dL   BUN 20 6 - 20 mg/dL   Creatinine, Ser 0.67 0.44 - 1.00 mg/dL   Calcium 9.4 8.9 - 10.3 mg/dL   GFR calc non Af Amer >60 >60 mL/min   GFR calc Af Amer >60 >60 mL/min   Anion gap 7 5 - 15  CBC  Result Value Ref Range   WBC 9.5 3.6 - 11.0 K/uL   RBC 5.60 (H) 3.80 - 5.20 MIL/uL   Hemoglobin 15.5 12.0 - 16.0 g/dL   HCT 46.2 35.0 - 47.0 %   MCV 82.5 80.0 - 100.0 fL   MCH 27.6 26.0 - 34.0 pg   MCHC 33.4 32.0 - 36.0 g/dL   RDW 13.3 11.5 - 14.5 %   Platelets 212 150 - 440 K/uL   Dg Chest 1  View  Result Date: 11/02/2016 CLINICAL DATA:  Hypertension.  Recent pulmonary vascular congestion EXAM: CHEST 1 VIEW COMPARISON:  October 31, 2016 FINDINGS: There is cardiomegaly with mild pulmonary venous hypertension. No edema or consolidation. There is aortic atherosclerosis. No adenopathy. There are surgical clips in the left supraclavicular region. No bone lesions evident. IMPRESSION: Pulmonary vascular congestion persists without edema or consolidation. There is aortic atherosclerosis. Electronically Signed   By: Lowella Grip III M.D.   On: 11/02/2016 09:47   Dg Chest 2 View  Result Date: 11/06/2016 CLINICAL DATA:  Altered mental status EXAM: CHEST  2 VIEW COMPARISON:  11/02/2016 chest radiograph. FINDINGS: Surgical sutures overlie the right upper lung. Stable cardiomediastinal silhouette with normal size and aortic atherosclerosis. No pneumothorax. No pleural effusion. Hyperinflated lungs. No pulmonary edema. No acute consolidative airspace disease. IMPRESSION: 1. No acute cardiopulmonary disease . 2. Hyperinflated lungs, suggesting COPD. 3. Aortic atherosclerosis. Electronically Signed   By: Ilona Sorrel M.D.   On: 11/06/2016 17:11   Ct Head Wo Contrast  Result Date: 11/06/2016 CLINICAL DATA:  Altered mental status EXAM: CT HEAD WITHOUT CONTRAST TECHNIQUE: Contiguous axial images were obtained from  the base of the skull through the vertex without intravenous contrast. COMPARISON:  03/18/2006 FINDINGS: Brain: No evidence of acute or remote infarction, hemorrhage, hydrocephalus, extra-axial collection or mass lesion/mass effect. The brain volume for age. No visible white matter disease. Vascular: Atherosclerotic calcifications.  No hyperdense vessel. Skull: Negative Sinuses/Orbits: Bilateral cataract resection. No pathologic finding. IMPRESSION: Age normal head CT. Electronically Signed   By: Monte Fantasia M.D.   On: 11/06/2016 16:57   Dg Chest Portable 1 View  Result Date:  10/31/2016 CLINICAL DATA:  Acute onset of shortness of breath with exertion. Initial encounter. EXAM: PORTABLE CHEST 1 VIEW COMPARISON:  Chest radiograph from 09/14/2013 FINDINGS: The lungs are well-aerated. Mild vascular congestion is noted. Increased interstitial markings raise question for minimal interstitial edema. There is no evidence of pleural effusion or pneumothorax. The cardiomediastinal silhouette is enlarged. No acute osseous abnormalities are seen. Clips are noted overlying the left supraclavicular region. IMPRESSION: Mild vascular congestion and cardiomegaly. Increased interstitial markings raise question for minimal interstitial edema. Electronically Signed   By: Garald Balding M.D.   On: 10/31/2016 21:53

## 2016-11-07 ENCOUNTER — Ambulatory Visit: Payer: Medicare Other | Admitting: Hematology and Oncology

## 2016-11-07 ENCOUNTER — Other Ambulatory Visit: Payer: Medicare Other

## 2016-11-09 ENCOUNTER — Ambulatory Visit: Payer: Medicare Other | Admitting: Family

## 2016-11-14 ENCOUNTER — Inpatient Hospital Stay: Payer: Medicare Other | Admitting: Hematology and Oncology

## 2016-11-14 ENCOUNTER — Inpatient Hospital Stay: Payer: Medicare Other

## 2016-11-14 NOTE — Progress Notes (Unsigned)
[facility] day:  11/14/16   Chief Complaint: Rose Irwin is a 83 y.o. female with a history of thyroid carcinoma and breast cancer who is seen for 4 month assessment.  HPI: The patient was last seen in the medical oncology clinic on 07/04/2016.  At that time, she continued to struggle with swallowing issues.  She had gained a pound.  Exam was stable.  Labs included a normal CBC with diff, CMP.  TSH was 0.955 with a free T4 of 1.19.  During the interim, she cancelled her appointment with Dr. Allen Norris.  She describes choking spells once in awhile.  She tries to eat.  She denies any nausea or vomiting.   Past Medical History:  Diagnosis Date  . Anxiety   . Arthritis   . Breast cancer ([place]) 11/21/2011   right breast cancer - radiation  . Bronchitis   . Cancer [facility])    breast, thyroid  . Complication of anesthesia    smothering  . COPD (chronic obstructive pulmonary disease) ([place])   . Depression   . Elevated lipids   . GERD (gastroesophageal reflux disease)   . Hemorrhoids   . Hypertension   . Hypothyroidism   . Neuropathy    rt leg  . Osteopenia   . Skin cancer     Past Surgical History:  Procedure Laterality Date  . ABDOMINAL HYSTERECTOMY    . BACK SURGERY     lumbar  . bladder polyps    . BREAST SURGERY Right   . CYSTOSCOPY W/ RETROGRADES Bilateral 02/09/2015   Procedure: CYSTOSCOPY WITH RETROGRADE PYELOGRAM;  Surgeon: Hollice Espy, MD;  Location: [facility] ORS;  Service: Urology;  Laterality: Bilateral;  . LUNG SURGERY    . THYROID SURGERY      Family History  Problem Relation Age of Onset  . Breast cancer Mother   . Breast cancer Sister   . Breast cancer Sister     Social History:  reports that she quit smoking about 2 years ago. Her smoking use included Cigarettes. She has a 210.00 pack-year smoking history. She has never used smokeless tobacco. She reports that she does not drink alcohol or use drugs.  She is smoking 1/4  pack a day. The patient is alone today.  Allergies:  Allergies  Allergen Reactions  . Codeine Other (See Comments)    Go limp, sweat  . Sulfa Antibiotics Swelling    tongue  . Aspirin Palpitations    Current Medications: Current Outpatient Prescriptions  Medication Sig Dispense Refill  . albuterol (PROAIR HFA) 108 (90 Base) MCG/ACT inhaler Inhale 2 puffs into the lungs every 4 (four) hours as needed for wheezing or shortness of breath. 1 Inhaler 1  . albuterol-ipratropium (COMBIVENT) 18-103 MCG/ACT inhaler Inhale 1 puff into the lungs every 4 (four) hours as needed for wheezing or shortness of breath.    . ALPRAZolam (XANAX) 0.5 MG tablet TAKE ONE TABLET TWICE DAILY AS NEEDED FOR ANXIETY 60 tablet 0  . aspirin EC 81 MG tablet Take 81 mg by mouth daily.    . Fluticasone-Salmeterol (ADVAIR DISKUS) 250-50 MCG/DOSE AEPB Inhale into the lungs.    Marland Kitchen levothyroxine (SYNTHROID, LEVOTHROID) 88 MCG tablet Take 1 tablet (88 mcg total) by mouth daily before breakfast. 30 tablet 1  . losartan-hydrochlorothiazide (HYZAAR) 50-12.5 MG tablet Take 1 tablet by mouth daily.     . metoprolol (LOPRESSOR) 50 MG tablet Take 50 mg by mouth 2 (two) times daily.    Marland Kitchen  PARoxetine (PAXIL) 20 MG tablet Take 30 mg by mouth daily.     . predniSONE (DELTASONE) 20 MG tablet 1 tab po qd for 3 days, then half a tab po qd for 3 days 6 tablet 0  . simvastatin (ZOCOR) 20 MG tablet Take 20 mg by mouth daily at 6 PM.     . traZODone (DESYREL) 50 MG tablet Take 25 mg by mouth at bedtime as needed for sleep.    Marland Kitchen triamcinolone ointment (KENALOG) 0.1 % Reported on 01/11/2016     No current facility-administered medications for this visit.     Review of Systems:  GENERAL:  Feels "good".  No fevers or sweats.  Weight gain of 1 pound. PERFORMANCE STATUS (ECOG):  1 HEENT:  No visual changes, runny nose, sore throat, mouth sores or tenderness. Lungs: No shortness of breath or cough.  No hemoptysis. Cardiac:  No chest pain,  palpitations, orthopnea, or PND. GI:  Choking spells once in awhile.  No nausea, vomiting, diarrhea, constipation, melena or hematochezia. GU:  Overactive bladder.  No urgency, frequency, dysuria, or hematuria. Musculoskeletal:   Chronic back pain (h/o back surgery; DDD) seeing orthopedics.  No joint pain.  No muscle tenderness. Extremities:  No pain or swelling. Skin:  No rashes or skin changes. Neuro:  Sciatic nerve issues (shooting pain down right leg).  Right leg gives away.  No headache, numbness or weakness, balance or coordination issues. Endocrine:  No diabetes.  h/o thyroid cancer.  No hot flashes or night sweats. Psych:  No mood changes, depression or anxiety. Pain:  No focal pain. Review of systems:  All other systems reviewed and found to be negative.  Physical Exam: There were no vitals taken for this visit. GENERAL:  Thin elderly woman sitting comfortably in the exam room in no acute distress.  She has a cane at her side. MENTAL STATUS:  Alert and oriented to person, place and time. HEAD:  Short styled gray hair.  Normocephalic, atraumatic, face symmetric, no Cushingoid features. EYES:  Brown eyes.  Pupils equal round and reactive to light and accomodation.  No conjunctivitis or scleral icterus. ENT:  Oropharynx clear without lesion.  Tongue normal. Mucous membranes moist.  RESPIRATORY:  Clear to auscultation without rales, wheezes or rhonchi. CARDIOVASCULAR:  Regular rate and rhythm without murmur, rub or gallop. ABDOMEN:  Soft, non-tender, with active bowel sounds, and no hepatosplenomegaly.  No masses. SKIN:  No rashes, ulcers or lesions. EXTREMITIES: No edema, no skin discoloration or tenderness.  No palpable cords. LYMPH NODES: No palpable cervical, supraclavicular, axillary or inguinal adenopathy  NEUROLOGICAL: Unremarkable. PSYCH:  Appropriate   No visits with results within 3 Day(s) from this visit.  Latest known visit with results is:  Admission on 11/06/2016,  Discharged on 11/06/2016  Component Date Value Ref Range Status  . Sodium 11/06/2016 133* 135 - 145 mmol/L Final  . Potassium 11/06/2016 3.5  3.5 - 5.1 mmol/L Final  . Chloride 11/06/2016 86* 101 - 111 mmol/L Final  . CO2 11/06/2016 40* 22 - 32 mmol/L Final  . Glucose, Bld 11/06/2016 124* 65 - 99 mg/dL Final  . BUN 11/06/2016 20  6 - 20 mg/dL Final  . Creatinine, Ser 11/06/2016 0.67  0.44 - 1.00 mg/dL Final  . Calcium 11/06/2016 9.4  8.9 - 10.3 mg/dL Final  . GFR calc non Af Amer 11/06/2016 >60  >60 mL/min Final  . GFR calc Af Amer 11/06/2016 >60  >60 mL/min Final   Comment: (NOTE) The eGFR  has been calculated using the CKD EPI equation. This calculation has not been validated in all clinical situations. eGFR's persistently <60 mL/min signify possible Chronic Kidney Disease.   . Anion gap 11/06/2016 7  5 - 15 Final  . WBC 11/06/2016 9.5  3.6 - 11.0 K/uL Final  . RBC 11/06/2016 5.60* 3.80 - 5.20 MIL/uL Final  . Hemoglobin 11/06/2016 15.5  12.0 - 16.0 g/dL Final  . HCT 11/06/2016 46.2  35.0 - 47.0 % Final  . MCV 11/06/2016 82.5  80.0 - 100.0 fL Final  . MCH 11/06/2016 27.6  26.0 - 34.0 pg Final  . MCHC 11/06/2016 33.4  32.0 - 36.0 g/dL Final  . RDW 11/06/2016 13.3  11.5 - 14.5 % Final  . Platelets 11/06/2016 212  150 - 440 K/uL Final  . Color, Urine 11/06/2016 YELLOW* YELLOW Final  . APPearance 11/06/2016 CLEAR* CLEAR Final  . Specific Gravity, Urine 11/06/2016 1.019  1.005 - 1.030 Final  . pH 11/06/2016 6.0  5.0 - 8.0 Final  . Glucose, UA 11/06/2016 NEGATIVE  NEGATIVE mg/dL Final  . Hgb urine dipstick 11/06/2016 SMALL* NEGATIVE Final  . Bilirubin Urine 11/06/2016 NEGATIVE  NEGATIVE Final  . Ketones, ur 11/06/2016 5* NEGATIVE mg/dL Final  . Protein, ur 11/06/2016 NEGATIVE  NEGATIVE mg/dL Final  . Nitrite 11/06/2016 NEGATIVE  NEGATIVE Final  . Leukocytes, UA 11/06/2016 NEGATIVE  NEGATIVE Final  . RBC / HPF 11/06/2016 0-5  0 - 5 RBC/hpf Final  . WBC, UA 11/06/2016 0-5  0 - 5  WBC/hpf Final  . Bacteria, UA 11/06/2016 NONE SEEN  NONE SEEN Final  . Squamous Epithelial / LPF 11/06/2016 NONE SEEN  NONE SEEN Final  . Mucous 11/06/2016 PRESENT   Final    Assessment:  Rose Irwin is a 83 y.o. female with a history of stage IB right breast cancer (2013) and thyroid cancer (1999).  She presented with neck fullness.  She underwent thyrodectomy and I-131.  She has a substernal goiter.  Chest CT on 08/01/2011 revealed a 3.8 x 3.9 x 5.7 cm mass in the superior mediastinum lying posterior to the trachea and displacing the esophagus toward the right along its upper portion.  More distally the thoracic esophagus appears normal. Decision was made for observation  She was diagnosed with with right breast cancer in 10/2011.  Pathology revealed a T1bN0M0 tumor which was ER positive, PR posiitive and her2/neu negative.  She underwent lumpectomy followed by radiation.  She began Arimidex after radiation.  She discontinued Arimidex in 2016.  She declined further hormonal therapy.    Mammogram on 11/17/2015 revealed no evidence of malignancy.   CA27.29 has been followed:  27.6 on 06/13/2012, 35.6 on 05/04/2015, 33.4 on 11/02/2015, and 30.2 on 03/14/2016.  She has a history of esophageal dilatation x 2 (last 15 years ago).  Chest CT on 11/09/2015 revealed a 3.7 x 4.0 mediastinal mass posterior to the mid trachea which was stable in size from 08/01/2011.  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 It exerted significant mass effect on the upper esophagus.    Barium swallow on 11/21/2015 revealed deviation of the cervical esophagus to the right from previously identified paratracheal mass lesion.  There were no motility issues with her esophagus.   I-131 scan with Thyrogen on 12/03/2015 revealed a large focus of intense radiotracer uptake within the superior mediastinum corresponding to the mediastinal mass identified on chest CT and compatible with residual  functioning thyroid tissue (recurrent thyroid cancer  or normal thyroid tissue).  She is not a surgical candidate.  Symptomatically, she continues to struggle with swallowing issues.  She has gained a pound.  Plan: 1.  Labs today:  CBC with diff, CMP, CA27.29, TSH, free T4.  2.  Reschedule appt with Dr. Allen Norris. 3   RTC in 4 months for MD assessment and labs (CBC with diff, CMP, CA27.29)   Lequita Asal, MD  11/14/2016, 6:36 AM

## 2016-11-21 ENCOUNTER — Inpatient Hospital Stay: Payer: Medicare Other

## 2016-11-21 ENCOUNTER — Inpatient Hospital Stay: Payer: Medicare Other | Attending: Hematology and Oncology | Admitting: Hematology and Oncology

## 2016-11-21 ENCOUNTER — Encounter: Payer: Self-pay | Admitting: Hematology and Oncology

## 2016-11-21 VITALS — BP 120/70 | HR 76 | Temp 97.9°F | Resp 18 | Wt 147.5 lb

## 2016-11-21 DIAGNOSIS — F17210 Nicotine dependence, cigarettes, uncomplicated: Secondary | ICD-10-CM | POA: Diagnosis not present

## 2016-11-21 DIAGNOSIS — Z853 Personal history of malignant neoplasm of breast: Secondary | ICD-10-CM | POA: Insufficient documentation

## 2016-11-21 DIAGNOSIS — E049 Nontoxic goiter, unspecified: Secondary | ICD-10-CM | POA: Diagnosis not present

## 2016-11-21 DIAGNOSIS — Z7952 Long term (current) use of systemic steroids: Secondary | ICD-10-CM | POA: Insufficient documentation

## 2016-11-21 DIAGNOSIS — K219 Gastro-esophageal reflux disease without esophagitis: Secondary | ICD-10-CM

## 2016-11-21 DIAGNOSIS — J449 Chronic obstructive pulmonary disease, unspecified: Secondary | ICD-10-CM | POA: Insufficient documentation

## 2016-11-21 DIAGNOSIS — Z9223 Personal history of estrogen therapy: Secondary | ICD-10-CM | POA: Diagnosis not present

## 2016-11-21 DIAGNOSIS — Z170 Estrogen receptor positive status [ER+]: Secondary | ICD-10-CM

## 2016-11-21 DIAGNOSIS — C73 Malignant neoplasm of thyroid gland: Secondary | ICD-10-CM

## 2016-11-21 DIAGNOSIS — E039 Hypothyroidism, unspecified: Secondary | ICD-10-CM | POA: Insufficient documentation

## 2016-11-21 DIAGNOSIS — M8580 Other specified disorders of bone density and structure, unspecified site: Secondary | ICD-10-CM | POA: Diagnosis not present

## 2016-11-21 DIAGNOSIS — C50911 Malignant neoplasm of unspecified site of right female breast: Secondary | ICD-10-CM

## 2016-11-21 DIAGNOSIS — Z85850 Personal history of malignant neoplasm of thyroid: Secondary | ICD-10-CM | POA: Diagnosis not present

## 2016-11-21 DIAGNOSIS — Z9981 Dependence on supplemental oxygen: Secondary | ICD-10-CM | POA: Diagnosis not present

## 2016-11-21 DIAGNOSIS — Z7982 Long term (current) use of aspirin: Secondary | ICD-10-CM | POA: Diagnosis not present

## 2016-11-21 DIAGNOSIS — Z923 Personal history of irradiation: Secondary | ICD-10-CM | POA: Diagnosis not present

## 2016-11-21 DIAGNOSIS — I10 Essential (primary) hypertension: Secondary | ICD-10-CM | POA: Diagnosis not present

## 2016-11-21 DIAGNOSIS — R5383 Other fatigue: Secondary | ICD-10-CM | POA: Insufficient documentation

## 2016-11-21 DIAGNOSIS — Z85828 Personal history of other malignant neoplasm of skin: Secondary | ICD-10-CM

## 2016-11-21 DIAGNOSIS — Z79899 Other long term (current) drug therapy: Secondary | ICD-10-CM | POA: Insufficient documentation

## 2016-11-21 LAB — COMPREHENSIVE METABOLIC PANEL
ALT: 18 U/L (ref 14–54)
AST: 23 U/L (ref 15–41)
Albumin: 3.6 g/dL (ref 3.5–5.0)
Alkaline Phosphatase: 81 U/L (ref 38–126)
Anion gap: 7 (ref 5–15)
BUN: 15 mg/dL (ref 6–20)
CO2: 34 mmol/L — ABNORMAL HIGH (ref 22–32)
Calcium: 9.1 mg/dL (ref 8.9–10.3)
Chloride: 95 mmol/L — ABNORMAL LOW (ref 101–111)
Creatinine, Ser: 0.7 mg/dL (ref 0.44–1.00)
GFR calc Af Amer: >60 mL/min (ref >60)
GFR calc non Af Amer: >60 mL/min (ref >60)
Glucose, Bld: 152 mg/dL — ABNORMAL HIGH (ref 65–99)
Potassium: 4.3 mmol/L (ref 3.5–5.1)
Sodium: 136 mmol/L (ref 135–145)
Total Bilirubin: 1 mg/dL (ref 0.3–1.2)
Total Protein: 6.5 g/dL (ref 6.5–8.1)

## 2016-11-21 LAB — CBC WITH DIFFERENTIAL/PLATELET
Basophils Absolute: 0 10*3/uL (ref 0–0.1)
Basophils Relative: 1 %
Eosinophils Absolute: 0.1 10*3/uL (ref 0–0.7)
Eosinophils Relative: 3 %
HCT: 39 % (ref 35.0–47.0)
Hemoglobin: 12.9 g/dL (ref 12.0–16.0)
Lymphocytes Relative: 24 %
Lymphs Abs: 1.1 10*3/uL (ref 1.0–3.6)
MCH: 27.2 pg (ref 26.0–34.0)
MCHC: 33 g/dL (ref 32.0–36.0)
MCV: 82.5 fL (ref 80.0–100.0)
Monocytes Absolute: 0.4 10*3/uL (ref 0.2–0.9)
Monocytes Relative: 8 %
Neutro Abs: 2.8 10*3/uL (ref 1.4–6.5)
Neutrophils Relative %: 64 %
Platelets: 177 10*3/uL (ref 150–440)
RBC: 4.72 MIL/uL (ref 3.80–5.20)
RDW: 13.6 % (ref 11.5–14.5)
WBC: 4.4 10*3/uL (ref 3.6–11.0)

## 2016-11-21 LAB — TSH: TSH: 1.085 u[IU]/mL (ref 0.350–4.500)

## 2016-11-21 LAB — T4, FREE: Free T4: 1.18 ng/dL — ABNORMAL HIGH (ref 0.61–1.12)

## 2016-11-21 NOTE — Progress Notes (Signed)
[facility] day:  11/21/16   Chief Complaint: Rose Irwin is a 83 y.o. female with a history of thyroid carcinoma and breast cancer who is seen for 4 month assessment.  HPI: The patient was last seen in the medical oncology clinic on 07/04/2016.  At that time, she continued to struggle with swallowing issues.  She had gained a pound.  Exam was stable.  Labs included a normal CBC with diff, CMP.  TSH was 0.955 with a free T4 of 1.19.  She was admitted to [facility] from 10/31/2016 - 11/03/2016 with COPD flare.  She was treated with steroids, nebulizers, expectorants, and oxygen.  She was discharged home on oxygen.  Smoking cessation was encouraged.    The patient states that she has been through a lot.  Her husband died.  She was hospitalized with a COPD flare.  She is exhausted and needs a time out.  She still gets choked and strangled. She plans to make an appointment with Dr. Allen Norris in the future once she is on her feet. She has a mammogram appointment in 2 weeks.   Past Medical History:  Diagnosis Date  . Anxiety   . Arthritis   . Breast cancer ([place]) 11/21/2011   right breast cancer - radiation  . Bronchitis   . Cancer [facility])    breast, thyroid  . Complication of anesthesia    smothering  . COPD (chronic obstructive pulmonary disease) ([place])   . Depression   . Elevated lipids   . GERD (gastroesophageal reflux disease)   . Hemorrhoids   . Hypertension   . Hypothyroidism   . Neuropathy    rt leg  . Osteopenia   . Skin cancer     Past Surgical History:  Procedure Laterality Date  . ABDOMINAL HYSTERECTOMY    . BACK SURGERY     lumbar  . bladder polyps    . BREAST SURGERY Right   . CYSTOSCOPY W/ RETROGRADES Bilateral 02/09/2015   Procedure: CYSTOSCOPY WITH RETROGRADE PYELOGRAM;  Surgeon: Hollice Espy, MD;  Location: [facility] ORS;  Service: Urology;  Laterality: Bilateral;  . LUNG SURGERY    . THYROID SURGERY      Family History   Problem Relation Age of Onset  . Breast cancer Mother   . Breast cancer Sister   . Breast cancer Sister     Social History:  reports that she quit smoking about 2 years ago. Her smoking use included Cigarettes. She has a 210.00 pack-year smoking history. She has never used smokeless tobacco. She reports that she does not drink alcohol or use drugs.  She is smoking 1/4 pack a day. Her husband died. The patient is accompanied by her baby sister, Apolonio Schneiders, today.  Allergies:  Allergies  Allergen Reactions  . Codeine Other (See Comments)    Go limp, sweat  . Sulfa Antibiotics Swelling    tongue  . Aspirin Palpitations    Current Medications: Current Outpatient Prescriptions  Medication Sig Dispense Refill  . albuterol (PROAIR HFA) 108 (90 Base) MCG/ACT inhaler Inhale 2 puffs into the lungs every 4 (four) hours as needed for wheezing or shortness of breath. 1 Inhaler 1  . albuterol-ipratropium (COMBIVENT) 18-103 MCG/ACT inhaler Inhale 1 puff into the lungs every 4 (four) hours as needed for wheezing or shortness of breath.    . ALPRAZolam (XANAX) 0.5 MG tablet TAKE ONE TABLET TWICE DAILY AS NEEDED FOR ANXIETY 60 tablet 0  . aspirin EC  81 MG tablet Take 81 mg by mouth daily.    . Fluticasone-Salmeterol (ADVAIR DISKUS) 250-50 MCG/DOSE AEPB Inhale into the lungs.    Marland Kitchen levothyroxine (SYNTHROID, LEVOTHROID) 88 MCG tablet Take 1 tablet (88 mcg total) by mouth daily before breakfast. 30 tablet 1  . losartan-hydrochlorothiazide (HYZAAR) 50-12.5 MG tablet Take 1 tablet by mouth daily.     . metoprolol (LOPRESSOR) 50 MG tablet Take 50 mg by mouth 2 (two) times daily.    Marland Kitchen PARoxetine (PAXIL) 20 MG tablet Take 30 mg by mouth daily.     Marland Kitchen PARoxetine (PAXIL) 20 MG tablet Take 20 mg by mouth daily.    . simvastatin (ZOCOR) 20 MG tablet Take 20 mg by mouth daily at 6 PM.     . traZODone (DESYREL) 50 MG tablet Take 25 mg by mouth at bedtime as needed for sleep.    Marland Kitchen triamcinolone ointment (KENALOG) 0.1 %  Reported on 01/11/2016    . COMBIVENT RESPIMAT 20-100 MCG/ACT AERS respimat     . predniSONE (DELTASONE) 20 MG tablet 1 tab po qd for 3 days, then half a tab po qd for 3 days (Patient not taking: Reported on 11/21/2016) 6 tablet 0   No current facility-administered medications for this visit.     Review of Systems:  GENERAL:  Feels "exhausted".  No fevers or sweats.  Weight gain of 9 pounds. PERFORMANCE STATUS (ECOG):  1 HEENT:  No visual changes, runny nose, sore throat, mouth sores or tenderness. Lungs: No shortness of breath or cough.  No hemoptysis. Interval COPD flare. 2 liters O2 via [place] at night. Cardiac:  No chest pain, palpitations, orthopnea, or PND. GI:  Choking spells once in awhile.  No nausea, vomiting, diarrhea, constipation, melena or hematochezia. GU:  No urgency, frequency, dysuria, or hematuria. Musculoskeletal:   Chronic back pain (h/o back surgery; DDD).  No joint pain.  No muscle tenderness. Extremities:  No pain or swelling. Skin:  No rashes or skin changes. Neuro:  Sciatic nerve issues (shooting pain down right leg).  Right leg gives away.  No headache, numbness or weakness, balance or coordination issues. Endocrine:  No diabetes.  h/o thyroid cancer.  No hot flashes or night sweats. Psych:  No mood changes, depression or anxiety. Pain:  No focal pain. Review of systems:  All other systems reviewed and found to be negative.  Physical Exam: Blood pressure 120/70, pulse 76, temperature 97.9 F (36.6 C), temperature source Tympanic, resp. rate 18, weight 147 lb 7.8 oz (66.9 kg). GENERAL:  Thin elderly woman sitting comfortably in the exam room in no acute distress.  She has a cane at her side. MENTAL STATUS:  Alert and oriented to person, place and time. HEAD:  Short styled gray hair.  Normocephalic, atraumatic, face symmetric, no Cushingoid features. EYES:  Brown eyes.  Pupils equal round and reactive to light and accomodation.  No conjunctivitis or scleral  icterus. ENT:  Oropharynx clear without lesion.  Tongue normal. Mucous membranes moist.  RESPIRATORY:  Clear to auscultation without rales, wheezes or rhonchi. CARDIOVASCULAR:  Regular rate and rhythm without murmur, rub or gallop. ABDOMEN:  Soft, non-tender, with active bowel sounds, and no hepatosplenomegaly.  No masses. SKIN:  No rashes, ulcers or lesions. EXTREMITIES: No edema, no skin discoloration or tenderness.  No palpable cords. LYMPH NODES: No palpable cervical, supraclavicular, axillary or inguinal adenopathy  NEUROLOGICAL: Unremarkable. PSYCH:  Appropriate   Appointment on 11/21/2016  Component Date Value Ref Range Status  . WBC  11/21/2016 4.4  3.6 - 11.0 K/uL Final  . RBC 11/21/2016 4.72  3.80 - 5.20 MIL/uL Final  . Hemoglobin 11/21/2016 12.9  12.0 - 16.0 g/dL Final  . HCT 11/21/2016 39.0  35.0 - 47.0 % Final  . MCV 11/21/2016 82.5  80.0 - 100.0 fL Final  . MCH 11/21/2016 27.2  26.0 - 34.0 pg Final  . MCHC 11/21/2016 33.0  32.0 - 36.0 g/dL Final  . RDW 11/21/2016 13.6  11.5 - 14.5 % Final  . Platelets 11/21/2016 177  150 - 440 K/uL Final  . Neutrophils Relative % 11/21/2016 64  % Final  . Neutro Abs 11/21/2016 2.8  1.4 - 6.5 K/uL Final  . Lymphocytes Relative 11/21/2016 24  % Final  . Lymphs Abs 11/21/2016 1.1  1.0 - 3.6 K/uL Final  . Monocytes Relative 11/21/2016 8  % Final  . Monocytes Absolute 11/21/2016 0.4  0.2 - 0.9 K/uL Final  . Eosinophils Relative 11/21/2016 3  % Final  . Eosinophils Absolute 11/21/2016 0.1  0 - 0.7 K/uL Final  . Basophils Relative 11/21/2016 1  % Final  . Basophils Absolute 11/21/2016 0.0  0 - 0.1 K/uL Final  . Sodium 11/21/2016 136  135 - 145 mmol/L Final  . Potassium 11/21/2016 4.3  3.5 - 5.1 mmol/L Final  . Chloride 11/21/2016 95* 101 - 111 mmol/L Final  . CO2 11/21/2016 34* 22 - 32 mmol/L Final  . Glucose, Bld 11/21/2016 152* 65 - 99 mg/dL Final  . BUN 11/21/2016 15  6 - 20 mg/dL Final  . Creatinine, Ser 11/21/2016 0.70  0.44 - 1.00  mg/dL Final  . Calcium 11/21/2016 9.1  8.9 - 10.3 mg/dL Final  . Total Protein 11/21/2016 6.5  6.5 - 8.1 g/dL Final  . Albumin 11/21/2016 3.6  3.5 - 5.0 g/dL Final  . AST 11/21/2016 23  15 - 41 U/L Final  . ALT 11/21/2016 18  14 - 54 U/L Final  . Alkaline Phosphatase 11/21/2016 81  38 - 126 U/L Final  . Total Bilirubin 11/21/2016 1.0  0.3 - 1.2 mg/dL Final  . GFR calc non Af Amer 11/21/2016 >60  >60 mL/min Final  . GFR calc Af Amer 11/21/2016 >60  >60 mL/min Final   Comment: (NOTE) The eGFR has been calculated using the CKD EPI equation. This calculation has not been validated in all clinical situations. eGFR's persistently <60 mL/min signify possible Chronic Kidney Disease.   Rose Irwin gap 11/21/2016 7  5 - 15 Final    Assessment:  CHINAZA ROOKE is a 83 y.o. female with a history of stage IB right breast cancer (2013) and thyroid cancer (1999).  She presented with neck fullness.  She underwent thyrodectomy and I-131.  She has a substernal goiter.  Chest CT on 08/01/2011 revealed a 3.8 x 3.9 x 5.7 cm mass in the superior mediastinum lying posterior to the trachea and displacing the esophagus toward the right along its upper portion.  More distally the thoracic esophagus appears normal. Decision was made for observation  She was diagnosed with with right breast cancer in 10/2011.  Pathology revealed a T1bN0M0 tumor which was ER positive, PR posiitive and her2/neu negative.  She underwent lumpectomy followed by radiation.  She began Arimidex after radiation.  She discontinued Arimidex in 2016.  She declined further hormonal therapy.    Bilateral mammogram on 11/17/2015 revealed no evidence of malignancy.   CA27.29 has been followed:  27.6 on 06/13/2012, 35.6 on 05/04/2015, 33.4 on 11/02/2015,  30.2 on 03/14/2016, and 28.1 on 11/21/2016.  She has a history of esophageal dilatation x 2 (last 15 years ago).  Chest CT on 11/09/2015 revealed a 3.7 x 4.0 mediastinal mass posterior to the mid  trachea which was stable in size from 08/01/2011.  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 It exerted significant mass effect on the upper esophagus.    Barium swallow on 11/21/2015 revealed deviation of the cervical esophagus to the right from previously identified paratracheal mass lesion.  There were no motility issues with her esophagus.   I-131 scan with Thyrogen on 12/03/2015 revealed a large focus of intense radiotracer uptake within the superior mediastinum corresponding to the mediastinal mass identified on chest CT and compatible with residual functioning thyroid tissue (recurrent thyroid cancer or normal thyroid tissue).  She is not a surgical candidate.  Symptomatically, she continues to struggle with swallowing issues.  Exam is stable.  Plan: 1.  Labs today:  CBC with diff, CMP, CA27.29, TSH, free T4. 2.  Follow-up mammogram in 2 weeks. 3.  Reschedule appt with Dr. Allen Norris. 4   RTC in 4 months for MD assessment and labs (CBC with diff, CMP, CA27.29).   Lequita Asal, MD  11/21/2016, 2:53 PM

## 2016-11-21 NOTE — Progress Notes (Signed)
Patient here today for follow up regarding breast cancer.  She recently lost her husband.  She has been in the hospital for COPD flare.  She is on 2 L O2 at night.  Patient scheduled for mammogram in 2 weeks.

## 2016-11-22 ENCOUNTER — Telehealth: Payer: Self-pay | Admitting: *Deleted

## 2016-11-22 LAB — CANCER ANTIGEN 27.29: CA 27.29: 28.1 U/mL (ref 0.0–38.6)

## 2016-11-22 NOTE — Telephone Encounter (Signed)
-----   Message from Lequita Asal, MD sent at 11/22/2016 11:56 AM EDT ----- Regarding: Please call patient   Thyroid studies are good.  Continue same dose.  M  ----- Message ----- From: Interface, Lab In [place] Sent: 11/21/2016   2:14 PM To: Lequita Asal, MD

## 2016-11-22 NOTE — Telephone Encounter (Signed)
Called patient to let her know her thyroid studies were good.  Per MD patient should continue with current dose of thyroid medication.

## 2016-12-12 ENCOUNTER — Other Ambulatory Visit: Payer: Self-pay | Admitting: Hematology and Oncology

## 2016-12-12 ENCOUNTER — Other Ambulatory Visit: Payer: Self-pay | Admitting: Urology

## 2016-12-19 ENCOUNTER — Other Ambulatory Visit: Payer: Self-pay | Admitting: *Deleted

## 2016-12-19 DIAGNOSIS — Z170 Estrogen receptor positive status [ER+]: Principal | ICD-10-CM

## 2016-12-19 DIAGNOSIS — C50911 Malignant neoplasm of unspecified site of right female breast: Secondary | ICD-10-CM

## 2016-12-28 ENCOUNTER — Ambulatory Visit
Admission: RE | Admit: 2016-12-28 | Discharge: 2016-12-28 | Disposition: A | Discharge status: Home / Self Care | Payer: Medicare Other | Source: Ambulatory Visit | Attending: Hematology and Oncology | Admitting: Hematology and Oncology

## 2016-12-28 DIAGNOSIS — C50911 Malignant neoplasm of unspecified site of right female breast: Secondary | ICD-10-CM | POA: Diagnosis present

## 2016-12-28 DIAGNOSIS — Z9889 Other specified postprocedural states: Secondary | ICD-10-CM | POA: Insufficient documentation

## 2016-12-28 DIAGNOSIS — Z170 Estrogen receptor positive status [ER+]: Secondary | ICD-10-CM | POA: Insufficient documentation

## 2017-01-15 ENCOUNTER — Other Ambulatory Visit: Payer: Self-pay | Admitting: Urology

## 2017-01-30 ENCOUNTER — Other Ambulatory Visit: Payer: Self-pay | Admitting: Urology

## 2017-02-04 ENCOUNTER — Other Ambulatory Visit: Payer: Self-pay | Admitting: Orthopedic Surgery

## 2017-02-04 DIAGNOSIS — M9973 Connective tissue and disc stenosis of intervertebral foramina of lumbar region: Secondary | ICD-10-CM

## 2017-02-08 ENCOUNTER — Ambulatory Visit
Admission: RE | Admit: 2017-02-08 | Discharge: 2017-02-08 | Disposition: A | Discharge status: Home / Self Care | Payer: Medicare Other | Source: Ambulatory Visit | Attending: Orthopedic Surgery | Admitting: Orthopedic Surgery

## 2017-02-08 DIAGNOSIS — M5136 Other intervertebral disc degeneration, lumbar region: Secondary | ICD-10-CM | POA: Insufficient documentation

## 2017-02-08 DIAGNOSIS — M48061 Spinal stenosis, lumbar region without neurogenic claudication: Secondary | ICD-10-CM | POA: Diagnosis not present

## 2017-02-08 DIAGNOSIS — M5134 Other intervertebral disc degeneration, thoracic region: Secondary | ICD-10-CM | POA: Diagnosis not present

## 2017-02-08 DIAGNOSIS — M9973 Connective tissue and disc stenosis of intervertebral foramina of lumbar region: Secondary | ICD-10-CM

## 2017-02-13 ENCOUNTER — Ambulatory Visit: Payer: Medicare Other | Admitting: Pharmacy Technician

## 2017-02-18 ENCOUNTER — Other Ambulatory Visit: Payer: Self-pay | Admitting: Urology

## 2017-02-18 DIAGNOSIS — Z170 Estrogen receptor positive status [ER+]: Principal | ICD-10-CM

## 2017-02-18 DIAGNOSIS — C50911 Malignant neoplasm of unspecified site of right female breast: Secondary | ICD-10-CM

## 2017-02-18 DIAGNOSIS — C73 Malignant neoplasm of thyroid gland: Secondary | ICD-10-CM

## 2017-02-19 ENCOUNTER — Other Ambulatory Visit: Payer: Self-pay | Admitting: Urology

## 2017-03-13 ENCOUNTER — Other Ambulatory Visit: Payer: Self-pay | Admitting: Hematology and Oncology

## 2017-03-21 ENCOUNTER — Other Ambulatory Visit: Payer: Self-pay | Admitting: Urology

## 2017-03-27 ENCOUNTER — Other Ambulatory Visit: Payer: Medicare Other

## 2017-03-27 ENCOUNTER — Ambulatory Visit: Payer: Medicare Other | Admitting: Hematology and Oncology

## 2017-04-02 NOTE — Progress Notes (Signed)
This encounter was created in error - please disregard.

## 2017-04-03 ENCOUNTER — Other Ambulatory Visit: Payer: Medicare Other

## 2017-04-03 ENCOUNTER — Encounter: Payer: Medicare Other | Admitting: Hematology and Oncology

## 2017-04-09 NOTE — Progress Notes (Deleted)
[place] [facility] day:  04/09/17   Chief Complaint: Rose Irwin is a 83 y.o. female with a history of thyroid carcinoma and breast cancer who is seen for 4 month assessment.  HPI: The patient was last seen in the medical oncology clinic on 11/21/2016.  At that time, patient reported that she been going through a lot citing the recent loss of her husband. Patient had been recently hospitalized (10/31/2016 - 11/03/2016) secondary to an AECOPD. She was discharged home on oxygen. She had continued problems with dysphagia.  She was going to make an appointment with Dr. Allen Norris when she was "back on her feet". She was still smoking. CBC, chemistries, and thyroid function were all unremarkable. CA 27.29 was stable at 28.1.  Diagnostic mammogram on 12/28/2016 revealed a stable right breast lumpectomy site. There was no evidence of malignancy in the bilateral breasts.   Lumbar spine MRI on 02/08/2017 revealed moderate spinal stenosis at L3-L4 level. There was evidence of a decompressive laminectomy at L4-L5 level; done in 1999.  There was severe right foraminal encroachment due to bony overgrowth.  Regarding GI, patient reported dysphagia at previous visits. She was going to follow up with Dr. Allen Norris.   Symptomatically,   Past Medical History:  Diagnosis Date  . Anxiety   . Arthritis   . Breast cancer ([place]) 11/21/2011   right breast cancer - radiation  . Bronchitis   . Cancer [facility])    breast, thyroid  . Complication of anesthesia    smothering  . COPD (chronic obstructive pulmonary disease) ([place])   . Depression   . Elevated lipids   . GERD (gastroesophageal reflux disease)   . Hemorrhoids   . Hypertension   . Hypothyroidism   . Neuropathy    rt leg  . Osteopenia   . Skin cancer     Past Surgical History:  Procedure Laterality Date  . ABDOMINAL HYSTERECTOMY    . BACK SURGERY     lumbar  . bladder polyps    . BREAST SURGERY Right   . CYSTOSCOPY  W/ RETROGRADES Bilateral 02/09/2015   Procedure: CYSTOSCOPY WITH RETROGRADE PYELOGRAM;  Surgeon: Hollice Espy, MD;  Location: [facility] ORS;  Service: Urology;  Laterality: Bilateral;  . LUNG SURGERY    . THYROID SURGERY      Family History  Problem Relation Age of Onset  . Breast cancer Mother   . Breast cancer Sister   . Breast cancer Sister     Social History:  reports that she quit smoking about 2 years ago. Her smoking use included Cigarettes. She has a 210.00 pack-year smoking history. She has never used smokeless tobacco. She reports that she does not drink alcohol or use drugs.  She is smoking 1/4 pack a day. Her husband died. The patient is accompanied by her baby sister, Apolonio Schneiders, today.  Allergies:  Allergies  Allergen Reactions  . Codeine Other (See Comments)    Go limp, sweat  . Sulfa Antibiotics Swelling    tongue  . Aspirin Palpitations    Current Medications: Current Outpatient Prescriptions  Medication Sig Dispense Refill  . albuterol (PROAIR HFA) 108 (90 Base) MCG/ACT inhaler Inhale 2 puffs into the lungs every 4 (four) hours as needed for wheezing or shortness of breath. 1 Inhaler 1  . albuterol-ipratropium (COMBIVENT) 18-103 MCG/ACT inhaler Inhale 1 puff into the lungs every 4 (four) hours as needed for wheezing or shortness of breath.    . ALPRAZolam Duanne Moron)  0.5 MG tablet TAKE ONE TABLET TWICE DAILY AS NEEDED FOR ANXIETY 60 tablet 0  . aspirin EC 81 MG tablet Take 81 mg by mouth daily.    . COMBIVENT RESPIMAT 20-100 MCG/ACT AERS respimat     . Fluticasone-Salmeterol (ADVAIR DISKUS) 250-50 MCG/DOSE AEPB Inhale into the lungs.    Marland Kitchen losartan-hydrochlorothiazide (HYZAAR) 50-12.5 MG tablet Take 1 tablet by mouth daily.     . metoprolol (LOPRESSOR) 50 MG tablet Take 50 mg by mouth 2 (two) times daily.    Marland Kitchen PARoxetine (PAXIL) 20 MG tablet Take 30 mg by mouth daily.     Marland Kitchen PARoxetine (PAXIL) 20 MG tablet Take 20 mg by mouth daily.    . predniSONE (DELTASONE) 20 MG tablet 1  tab po qd for 3 days, then half a tab po qd for 3 days (Patient not taking: Reported on 11/21/2016) 6 tablet 0  . simvastatin (ZOCOR) 20 MG tablet Take 20 mg by mouth daily at 6 PM.     . SYNTHROID 88 MCG tablet TAKE ONE TABLET BY MOUTH EVERY MORNING BEFORE BREAKFAST 30 tablet 3  . traZODone (DESYREL) 50 MG tablet Take 25 mg by mouth at bedtime as needed for sleep.    Marland Kitchen triamcinolone ointment (KENALOG) 0.1 % Reported on 01/11/2016     No current facility-administered medications for this visit.     Review of Systems:  GENERAL:  Feels "exhausted".  No fevers or sweats.  Weight  PERFORMANCE STATUS (ECOG):  1 HEENT:  No visual changes, runny nose, sore throat, mouth sores or tenderness. Lungs: No shortness of breath or cough.  No hemoptysis. Interval COPD flare. 2 liters O2 via [place] at night. Cardiac:  No chest pain, palpitations, orthopnea, or PND. GI:  Choking spells once in awhile.  No nausea, vomiting, diarrhea, constipation, melena or hematochezia. GU:  No urgency, frequency, dysuria, or hematuria. Musculoskeletal:   Chronic back pain (h/o back surgery; DDD).  No joint pain.  No muscle tenderness. Extremities:  No pain or swelling. Skin:  No rashes or skin changes. Neuro:  Sciatic nerve issues (shooting pain down right leg).  Right leg gives away.  No headache, numbness or weakness, balance or coordination issues. Endocrine:  No diabetes.  h/o thyroid cancer.  No hot flashes or night sweats. Psych:  No mood changes, depression or anxiety. Pain:  No focal pain. Review of systems:  All other systems reviewed and found to be negative.  Physical Exam: There were no vitals taken for this visit. GENERAL:  Thin elderly woman sitting comfortably in the exam room in no acute distress.  She has a cane at her side. MENTAL STATUS:  Alert and oriented to person, place and time. HEAD:  Short styled Jadore Mcguffin hair.  Normocephalic, atraumatic, face symmetric, no Cushingoid features. EYES:  Brown eyes.  Pupils  equal round and reactive to light and accomodation.  No conjunctivitis or scleral icterus. ENT:  Oropharynx clear without lesion.  Tongue normal. Mucous membranes moist.  RESPIRATORY:  Clear to auscultation without rales, wheezes or rhonchi. CARDIOVASCULAR:  Regular rate and rhythm without murmur, rub or gallop. ABDOMEN:  Soft, non-tender, with active bowel sounds, and no hepatosplenomegaly.  No masses. SKIN:  No rashes, ulcers or lesions. EXTREMITIES: No edema, no skin discoloration or tenderness.  No palpable cords. LYMPH NODES: No palpable cervical, supraclavicular, axillary or inguinal adenopathy  NEUROLOGICAL: Unremarkable. PSYCH:  Appropriate   No visits with results within 3 Day(s) from this visit.  Latest known visit with results  is:  Appointment on 11/21/2016  Component Date Value Ref Range Status  . CA 27.29 11/21/2016 28.1  0.0 - 38.6 U/mL Final   Comment: (NOTE) [employer]/ACS methodology Performed At: [facility] [place], [place] [id number] Lindon Romp MD [id number]   . WBC 11/21/2016 4.4  3.6 - 11.0 K/uL Final  . RBC 11/21/2016 4.72  3.80 - 5.20 MIL/uL Final  . Hemoglobin 11/21/2016 12.9  12.0 - 16.0 g/dL Final  . HCT 11/21/2016 39.0  35.0 - 47.0 % Final  . MCV 11/21/2016 82.5  80.0 - 100.0 fL Final  . MCH 11/21/2016 27.2  26.0 - 34.0 pg Final  . MCHC 11/21/2016 33.0  32.0 - 36.0 g/dL Final  . RDW 11/21/2016 13.6  11.5 - 14.5 % Final  . Platelets 11/21/2016 177  150 - 440 K/uL Final  . Neutrophils Relative % 11/21/2016 64  % Final  . Neutro Abs 11/21/2016 2.8  1.4 - 6.5 K/uL Final  . Lymphocytes Relative 11/21/2016 24  % Final  . Lymphs Abs 11/21/2016 1.1  1.0 - 3.6 K/uL Final  . Monocytes Relative 11/21/2016 8  % Final  . Monocytes Absolute 11/21/2016 0.4  0.2 - 0.9 K/uL Final  . Eosinophils Relative 11/21/2016 3  % Final  . Eosinophils Absolute 11/21/2016 0.1  0 - 0.7 K/uL Final  . Basophils Relative 11/21/2016 1  % Final  .  Basophils Absolute 11/21/2016 0.0  0 - 0.1 K/uL Final  . Sodium 11/21/2016 136  135 - 145 mmol/L Final  . Potassium 11/21/2016 4.3  3.5 - 5.1 mmol/L Final  . Chloride 11/21/2016 95* 101 - 111 mmol/L Final  . CO2 11/21/2016 34* 22 - 32 mmol/L Final  . Glucose, Bld 11/21/2016 152* 65 - 99 mg/dL Final  . BUN 11/21/2016 15  6 - 20 mg/dL Final  . Creatinine, Ser 11/21/2016 0.70  0.44 - 1.00 mg/dL Final  . Calcium 11/21/2016 9.1  8.9 - 10.3 mg/dL Final  . Total Protein 11/21/2016 6.5  6.5 - 8.1 g/dL Final  . Albumin 11/21/2016 3.6  3.5 - 5.0 g/dL Final  . AST 11/21/2016 23  15 - 41 U/L Final  . ALT 11/21/2016 18  14 - 54 U/L Final  . Alkaline Phosphatase 11/21/2016 81  38 - 126 U/L Final  . Total Bilirubin 11/21/2016 1.0  0.3 - 1.2 mg/dL Final  . GFR calc non Af Amer 11/21/2016 >60  >60 mL/min Final  . GFR calc Af Amer 11/21/2016 >60  >60 mL/min Final   Comment: (NOTE) The eGFR has been calculated using the CKD EPI equation. This calculation has not been validated in all clinical situations. eGFR's persistently <60 mL/min signify possible Chronic Kidney Disease.   . Anion gap 11/21/2016 7  5 - 15 Final  . TSH 11/21/2016 1.085  0.350 - 4.500 uIU/mL Final   Performed by a 3rd Generation assay with a functional sensitivity of <=0.01 uIU/mL.  . Free T4 11/21/2016 1.18* 0.61 - 1.12 ng/dL Final   Comment: (NOTE) Biotin ingestion may interfere with free T4 tests. If the results are inconsistent with the TSH level, previous test results, or the clinical presentation, then consider biotin interference. If needed, order repeat testing after stopping biotin.     Assessment:  ELAYNA TOBLER is a 83 y.o. female with a history of stage IB right breast cancer (2013) and thyroid cancer (1999).  She presented with neck fullness.  She underwent thyrodectomy and I-131.  She has  a substernal goiter.  Chest CT on 08/01/2011 revealed a 3.8 x 3.9 x 5.7 cm mass in the superior mediastinum lying posterior to  the trachea and displacing the esophagus toward the right along its upper portion.  More distally the thoracic esophagus appears normal. Decision was made for observation  She was diagnosed with with right breast cancer in 10/2011.  Pathology revealed a T1bN0M0 tumor which was ER positive, PR posiitive and her2/neu negative.  She underwent lumpectomy followed by radiation.  She began Arimidex after radiation.  She discontinued Arimidex in 2016.  She declined further hormonal therapy.    Bilateral mammogram on 11/17/2015 and 12/28/2016 revealed no evidence of malignancy.   CA27.29 has been followed:  27.6 on 06/13/2012, 35.6 on 05/04/2015, 33.4 on 11/02/2015, 30.2 on 03/14/2016, and 28.1 on 11/21/2016.  She has a history of esophageal dilatation x 2 (last 15 years ago).  Chest CT on 11/09/2015 revealed a 3.7 x 4.0 mediastinal mass posterior to the mid trachea which was stable in size from 08/01/2011.  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 It exerted significant mass effect on the upper esophagus.    Barium swallow on 11/21/2015 revealed deviation of the cervical esophagus to the right from previously identified paratracheal mass lesion.  There were no motility issues with her esophagus.   I-131 scan with Thyrogen on 12/03/2015 revealed a large focus of intense radiotracer uptake within the superior mediastinum corresponding to the mediastinal mass identified on chest CT and compatible with residual functioning thyroid tissue (recurrent thyroid cancer or normal thyroid tissue).  She is not a surgical candidate.  Symptomatically,  Exam is stable.  Plan: 1.  Labs today:  CBC with diff, CMP, CA27.29, TSH, free T4. 2.  Review mammogram- no evidence of malignancy. 3.    Reschedule appt with Dr. Allen Norris. 4   RTC in 4 months for MD assessment and labs (CBC with diff, CMP, CA27.29).   Honor Loh, NP  04/09/2017, 3:57 PM

## 2017-04-10 ENCOUNTER — Inpatient Hospital Stay: Payer: Medicare Other | Admitting: Hematology and Oncology

## 2017-04-10 ENCOUNTER — Inpatient Hospital Stay: Payer: Medicare Other | Attending: Hematology and Oncology

## 2017-04-16 NOTE — Progress Notes (Signed)
[facility] day:  04/17/17   Chief Complaint: Rose Irwin is a 83 y.o. female with a history of thyroid carcinoma and breast cancer who is seen for 4 month assessment.  HPI: The patient was last seen in the medical oncology clinic on 11/21/2016.  At that time, she was going through a lot citing the recent loss of her husband.  She had been recently hospitalized (10/31/2016 - 11/03/2016) secondary to an AECOPD. She was discharged home on oxygen. She had continued problems with dysphagia.  She was going to make an appointment with Dr. Allen Norris when she was "back on her feet". She was still smoking. CBC, chemistries, and thyroid function were all unremarkable. CA 27.29 was stable at 28.1.  Diagnostic mammogram on 12/28/2016 revealed a stable right breast lumpectomy site. There was no evidence of malignancy in the bilateral breasts.   Lumbar spine MRI on 02/08/2017 revealed moderate spinal stenosis at L3-L4 level. There was evidence of a decompressive laminectomy at L4-L5 level; done in 1999.  There was severe right foraminal encroachment due to bony overgrowth.  Symptomatically, patient doing "ok". Patient with RIGHT knee and ankle pain; has osteoarthritis. Patient continues to experience dysphagia; states "it is no worse than it was last time". Patient has lost 3 pounds since last visit.  She was going to follow up with Dr. Allen Norris, however patient decided that she was "not going to bother with that" citing that she has "too much on her".    Patient denies any AECOPD requiring hospitalizations since her last visit. She has stopped smoking. Patient scheduled to follow up with PCP next month.    Past Medical History:  Diagnosis Date  . Anxiety   . Arthritis   . Breast cancer ([place]) 11/21/2011   right breast cancer - radiation  . Bronchitis   . Cancer [facility])    breast, thyroid  . Complication of anesthesia    smothering  . COPD (chronic obstructive  pulmonary disease) ([place])   . Depression   . Elevated lipids   . GERD (gastroesophageal reflux disease)   . Hemorrhoids   . Hypertension   . Hypothyroidism   . Neuropathy    rt leg  . Osteopenia   . Skin cancer     Past Surgical History:  Procedure Laterality Date  . ABDOMINAL HYSTERECTOMY    . BACK SURGERY     lumbar  . bladder polyps    . BREAST SURGERY Right   . CYSTOSCOPY W/ RETROGRADES Bilateral 02/09/2015   Procedure: CYSTOSCOPY WITH RETROGRADE PYELOGRAM;  Surgeon: Hollice Espy, MD;  Location: [facility] ORS;  Service: Urology;  Laterality: Bilateral;  . LUNG SURGERY    . THYROID SURGERY      Family History  Problem Relation Age of Onset  . Breast cancer Mother   . Breast cancer Sister   . Breast cancer Sister     Social History:  reports that she quit smoking about 2 years ago. Her smoking use included Cigarettes. She has a 210.00 pack-year smoking history. She has never used smokeless tobacco. She reports that she does not drink alcohol or use drugs.  She stopped smoking in December 26, 2016. Her husband died. The patient is alone today.  Allergies:  Allergies  Allergen Reactions  . Prednisone Rash  . Codeine Other (See Comments)    Go limp, sweat  . Sulfa Antibiotics Swelling    tongue  . Aspirin Palpitations    Current Medications: Current Outpatient  Prescriptions  Medication Sig Dispense Refill  . albuterol (PROAIR HFA) 108 (90 Base) MCG/ACT inhaler Inhale 2 puffs into the lungs every 4 (four) hours as needed for wheezing or shortness of breath. 1 Inhaler 1  . albuterol-ipratropium (COMBIVENT) 18-103 MCG/ACT inhaler Inhale 1 puff into the lungs every 4 (four) hours as needed for wheezing or shortness of breath.    . ALPRAZolam (XANAX) 0.5 MG tablet TAKE ONE TABLET TWICE DAILY AS NEEDED FOR ANXIETY 60 tablet 0  . aspirin EC 81 MG tablet Take 81 mg by mouth daily.    . COMBIVENT RESPIMAT 20-100 MCG/ACT AERS respimat     . Fluticasone-Salmeterol (ADVAIR DISKUS) 250-50  MCG/DOSE AEPB Inhale into the lungs.    Marland Kitchen losartan-hydrochlorothiazide (HYZAAR) 50-12.5 MG tablet Take 1 tablet by mouth daily.     . metoprolol (LOPRESSOR) 50 MG tablet Take 50 mg by mouth 2 (two) times daily.    Marland Kitchen PARoxetine (PAXIL) 20 MG tablet Take 20 mg by mouth daily.    . simvastatin (ZOCOR) 20 MG tablet Take 20 mg by mouth daily at 6 PM.     . SYNTHROID 88 MCG tablet TAKE ONE TABLET BY MOUTH EVERY MORNING BEFORE BREAKFAST 30 tablet 3  . traZODone (DESYREL) 50 MG tablet Take 25 mg by mouth at bedtime as needed for sleep.    Marland Kitchen triamcinolone ointment (KENALOG) 0.1 % Reported on 01/11/2016     No current facility-administered medications for this visit.     Review of Systems:  GENERAL:  Feels "ok".  No fevers or sweats.  Weight down 3 pounds. PERFORMANCE STATUS (ECOG):  1 HEENT:  No visual changes, runny nose, sore throat, mouth sores or tenderness. Lungs: No shortness of breath or cough.  No hemoptysis. Interval COPD flare. 2 liters O2 via [place] at night. Cardiac:  No chest pain, palpitations, orthopnea, or PND. GI:  Choking spells once in awhile.  Not  A big eater.  No nausea, vomiting, diarrhea, constipation, melena or hematochezia. GU:  No urgency, frequency, dysuria, or hematuria. Musculoskeletal:   Chronic back pain (h/o back surgery; DDD).  No joint pain.  No muscle tenderness. Extremities:  RIGHT knee and ankle pain; osteoarthritis. No swelling. Skin:  No rashes or skin changes. Neuro:  Sciatic nerve issues (shooting pain down right leg).  Right leg gives away.  No headache, numbness or weakness, balance or coordination issues. Endocrine:  No diabetes.  h/o thyroid cancer.  No hot flashes or night sweats. Psych:  No mood changes, depression or anxiety. Pain:  No focal pain. Review of systems:  All other systems reviewed and found to be negative.  Physical Exam: Blood pressure (!) 149/71, pulse (!) 59, temperature (!) 97 F (36.1 C), temperature source Tympanic, resp. rate 18,  weight 144 lb 6.4 oz (65.5 kg). GENERAL:  Thin elderly woman sitting comfortably in the exam room in no acute distress.  She has a cane at her side. MENTAL STATUS:  Alert and oriented to person, place and time. HEAD:  Short styled gray hair.  Normocephalic, atraumatic, face symmetric, no Cushingoid features. EYES:  Brown eyes.  Pupils equal round and reactive to light and accomodation.  No conjunctivitis or scleral icterus. ENT:  Oropharynx clear without lesion.  Tongue normal. Mucous membranes moist.  RESPIRATORY:  Scattered soft wheezing bilaterally. No rales or rhonchi. CARDIOVASCULAR:  Regular rate and rhythm without murmur, rub or gallop. ABDOMEN:  Soft, non-tender, with active bowel sounds, and no hepatosplenomegaly.  No masses. SKIN:  No  rashes, ulcers or lesions. EXTREMITIES: No edema, no skin discoloration or tenderness.  No palpable cords. LYMPH NODES: No palpable cervical, supraclavicular, axillary or inguinal adenopathy  NEUROLOGICAL: Unremarkable. PSYCH:  Appropriate   Orders Only on 04/17/2017  Component Date Value Ref Range Status  . Sodium 04/17/2017 137  135 - 145 mmol/L Final  . Potassium 04/17/2017 3.5  3.5 - 5.1 mmol/L Final  . Chloride 04/17/2017 94* 101 - 111 mmol/L Final  . CO2 04/17/2017 37* 22 - 32 mmol/L Final  . Glucose, Bld 04/17/2017 124* 65 - 99 mg/dL Final  . BUN 04/17/2017 11  6 - 20 mg/dL Final  . Creatinine, Ser 04/17/2017 0.77  0.44 - 1.00 mg/dL Final  . Calcium 04/17/2017 9.3  8.9 - 10.3 mg/dL Final  . Total Protein 04/17/2017 6.7  6.5 - 8.1 g/dL Final  . Albumin 04/17/2017 3.9  3.5 - 5.0 g/dL Final  . AST 04/17/2017 24  15 - 41 U/L Final  . ALT 04/17/2017 16  14 - 54 U/L Final  . Alkaline Phosphatase 04/17/2017 84  38 - 126 U/L Final  . Total Bilirubin 04/17/2017 0.6  0.3 - 1.2 mg/dL Final  . GFR calc non Af Amer 04/17/2017 >60  >60 mL/min Final  . GFR calc Af Amer 04/17/2017 >60  >60 mL/min Final   Comment: (NOTE) The eGFR has been calculated  using the CKD EPI equation. This calculation has not been validated in all clinical situations. eGFR's persistently <60 mL/min signify possible Chronic Kidney Disease.   . Anion gap 04/17/2017 6  5 - 15 Final  . WBC 04/17/2017 4.8  3.6 - 11.0 K/uL Final  . RBC 04/17/2017 4.88  3.80 - 5.20 MIL/uL Final  . Hemoglobin 04/17/2017 13.8  12.0 - 16.0 g/dL Final  . HCT 04/17/2017 40.8  35.0 - 47.0 % Final  . MCV 04/17/2017 83.7  80.0 - 100.0 fL Final  . MCH 04/17/2017 28.3  26.0 - 34.0 pg Final  . MCHC 04/17/2017 33.8  32.0 - 36.0 g/dL Final  . RDW 04/17/2017 13.0  11.5 - 14.5 % Final  . Platelets 04/17/2017 217  150 - 440 K/uL Final  . Neutrophils Relative % 04/17/2017 69  % Final  . Neutro Abs 04/17/2017 3.3  1.4 - 6.5 K/uL Final  . Lymphocytes Relative 04/17/2017 20  % Final  . Lymphs Abs 04/17/2017 1.0  1.0 - 3.6 K/uL Final  . Monocytes Relative 04/17/2017 8  % Final  . Monocytes Absolute 04/17/2017 0.4  0.2 - 0.9 K/uL Final  . Eosinophils Relative 04/17/2017 2  % Final  . Eosinophils Absolute 04/17/2017 0.1  0 - 0.7 K/uL Final  . Basophils Relative 04/17/2017 1  % Final  . Basophils Absolute 04/17/2017 0.0  0 - 0.1 K/uL Final    Assessment:  NISSI DOFFING is a 83 y.o. female with a history of stage IB right breast cancer (2013) and thyroid cancer (1999).  She presented with neck fullness.  She underwent thyrodectomy and I-131.  She has a substernal goiter.  Chest CT on 08/01/2011 revealed a 3.8 x 3.9 x 5.7 cm mass in the superior mediastinum lying posterior to the trachea and displacing the esophagus toward the right along its upper portion.  More distally the thoracic esophagus appears normal. Decision was made for observation  She was diagnosed with with right breast cancer in 10/2011.  Pathology revealed a T1bN0M0 tumor which was ER positive, PR posiitive and her2/neu negative.  She underwent lumpectomy followed  by radiation.  She began Arimidex after radiation.  She discontinued  Arimidex in 2016.  She declined further hormonal therapy.    Bilateral mammogram on 12/28/2016 revealed no evidence of malignancy.   CA27.29 has been followed:  27.6 on 06/13/2012, 35.6 on 05/04/2015, 33.4 on 11/02/2015, 30.2 on 03/14/2016, 28.1 on 11/21/2016, and 33.4 on 04/17/2017.  She has a history of esophageal dilatation x 2 (last 15 years ago).  Chest CT on 11/09/2015 revealed a 3.7 x 4.0 mediastinal mass posterior to the mid trachea which was stable in size from 08/01/2011.  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 It exerted significant mass effect on the upper esophagus.    Barium swallow on 11/21/2015 revealed deviation of the cervical esophagus to the right from previously identified paratracheal mass lesion.  There were no motility issues with her esophagus.   I-131 scan with Thyrogen on 12/03/2015 revealed a large focus of intense radiotracer uptake within the superior mediastinum corresponding to the mediastinal mass identified on chest CT and compatible with residual functioning thyroid tissue (recurrent thyroid cancer or normal thyroid tissue).  She is not a surgical candidate.  Symptomatically, patient is doing "ok". Patient complaints of RIGHT knee and ankle pain. Exam is stable. CBC and chemistries unremarkable.   Plan: 1.  Labs today:  CBC with diff, CMP, CA27.29, TSH, free T4. 2.  Review mammogram- no evidence of malignancy. 3.  Discuss need to follow up with GI Allen Norris) for evaluation of continued dysphagia. Patient refuses.  4.  Refer to low dose chest CT program. Raquel Sarna, RN notified.  5   RTC in 6 months for MD assessment and labs (CBC with diff, CMP, CA27.29).   Honor Loh, NP  04/17/2017, 3:58 PM   I saw and evaluated the patient, participating in the key portions of the service and reviewing pertinent diagnostic studies and records.  I reviewed the nurse practitioner's note and agree with the findings and the plan.  The assessment and  plan were discussed with the patient.  A few questions were asked by the patient and answered.   Lequita Asal, MD 04/17/2017,3:58 PM

## 2017-04-17 ENCOUNTER — Other Ambulatory Visit: Payer: Self-pay | Admitting: *Deleted

## 2017-04-17 ENCOUNTER — Other Ambulatory Visit: Payer: Self-pay | Admitting: Urology

## 2017-04-17 ENCOUNTER — Inpatient Hospital Stay: Payer: Medicare Other

## 2017-04-17 ENCOUNTER — Other Ambulatory Visit: Payer: Self-pay

## 2017-04-17 ENCOUNTER — Inpatient Hospital Stay: Payer: Medicare Other | Attending: Hematology and Oncology | Admitting: Hematology and Oncology

## 2017-04-17 VITALS — BP 149/71 | HR 59 | Temp 97.0°F | Resp 18 | Wt 144.4 lb

## 2017-04-17 DIAGNOSIS — Z9223 Personal history of estrogen therapy: Secondary | ICD-10-CM | POA: Insufficient documentation

## 2017-04-17 DIAGNOSIS — Z170 Estrogen receptor positive status [ER+]: Principal | ICD-10-CM

## 2017-04-17 DIAGNOSIS — M25571 Pain in right ankle and joints of right foot: Secondary | ICD-10-CM | POA: Insufficient documentation

## 2017-04-17 DIAGNOSIS — Z7982 Long term (current) use of aspirin: Secondary | ICD-10-CM

## 2017-04-17 DIAGNOSIS — Z79899 Other long term (current) drug therapy: Secondary | ICD-10-CM | POA: Diagnosis not present

## 2017-04-17 DIAGNOSIS — M25561 Pain in right knee: Secondary | ICD-10-CM | POA: Insufficient documentation

## 2017-04-17 DIAGNOSIS — R1310 Dysphagia, unspecified: Secondary | ICD-10-CM | POA: Insufficient documentation

## 2017-04-17 DIAGNOSIS — C50911 Malignant neoplasm of unspecified site of right female breast: Secondary | ICD-10-CM

## 2017-04-17 DIAGNOSIS — C73 Malignant neoplasm of thyroid gland: Secondary | ICD-10-CM

## 2017-04-17 DIAGNOSIS — Z90710 Acquired absence of both cervix and uterus: Secondary | ICD-10-CM | POA: Insufficient documentation

## 2017-04-17 DIAGNOSIS — M1990 Unspecified osteoarthritis, unspecified site: Secondary | ICD-10-CM

## 2017-04-17 DIAGNOSIS — M48061 Spinal stenosis, lumbar region without neurogenic claudication: Secondary | ICD-10-CM | POA: Diagnosis not present

## 2017-04-17 DIAGNOSIS — Z853 Personal history of malignant neoplasm of breast: Secondary | ICD-10-CM | POA: Diagnosis not present

## 2017-04-17 DIAGNOSIS — M549 Dorsalgia, unspecified: Secondary | ICD-10-CM | POA: Diagnosis not present

## 2017-04-17 DIAGNOSIS — K219 Gastro-esophageal reflux disease without esophagitis: Secondary | ICD-10-CM | POA: Diagnosis not present

## 2017-04-17 DIAGNOSIS — J449 Chronic obstructive pulmonary disease, unspecified: Secondary | ICD-10-CM | POA: Diagnosis not present

## 2017-04-17 DIAGNOSIS — E039 Hypothyroidism, unspecified: Secondary | ICD-10-CM | POA: Insufficient documentation

## 2017-04-17 DIAGNOSIS — Z85850 Personal history of malignant neoplasm of thyroid: Secondary | ICD-10-CM | POA: Diagnosis not present

## 2017-04-17 DIAGNOSIS — Z923 Personal history of irradiation: Secondary | ICD-10-CM | POA: Diagnosis not present

## 2017-04-17 DIAGNOSIS — Z803 Family history of malignant neoplasm of breast: Secondary | ICD-10-CM | POA: Insufficient documentation

## 2017-04-17 DIAGNOSIS — M8580 Other specified disorders of bone density and structure, unspecified site: Secondary | ICD-10-CM | POA: Diagnosis not present

## 2017-04-17 DIAGNOSIS — Z87891 Personal history of nicotine dependence: Secondary | ICD-10-CM | POA: Insufficient documentation

## 2017-04-17 DIAGNOSIS — Z85828 Personal history of other malignant neoplasm of skin: Secondary | ICD-10-CM | POA: Insufficient documentation

## 2017-04-17 DIAGNOSIS — G8929 Other chronic pain: Secondary | ICD-10-CM | POA: Diagnosis not present

## 2017-04-17 DIAGNOSIS — I10 Essential (primary) hypertension: Secondary | ICD-10-CM | POA: Diagnosis not present

## 2017-04-17 LAB — CBC WITH DIFFERENTIAL/PLATELET
Basophils Absolute: 0 10*3/uL (ref 0–0.1)
Basophils Relative: 1 %
Eosinophils Absolute: 0.1 10*3/uL (ref 0–0.7)
Eosinophils Relative: 2 %
HCT: 40.8 % (ref 35.0–47.0)
Hemoglobin: 13.8 g/dL (ref 12.0–16.0)
Lymphocytes Relative: 20 %
Lymphs Abs: 1 10*3/uL (ref 1.0–3.6)
MCH: 28.3 pg (ref 26.0–34.0)
MCHC: 33.8 g/dL (ref 32.0–36.0)
MCV: 83.7 fL (ref 80.0–100.0)
Monocytes Absolute: 0.4 10*3/uL (ref 0.2–0.9)
Monocytes Relative: 8 %
Neutro Abs: 3.3 10*3/uL (ref 1.4–6.5)
Neutrophils Relative %: 69 %
Platelets: 217 10*3/uL (ref 150–440)
RBC: 4.88 MIL/uL (ref 3.80–5.20)
RDW: 13 % (ref 11.5–14.5)
WBC: 4.8 10*3/uL (ref 3.6–11.0)

## 2017-04-17 LAB — COMPREHENSIVE METABOLIC PANEL
ALT: 16 U/L (ref 14–54)
AST: 24 U/L (ref 15–41)
Albumin: 3.9 g/dL (ref 3.5–5.0)
Alkaline Phosphatase: 84 U/L (ref 38–126)
Anion gap: 6 (ref 5–15)
BUN: 11 mg/dL (ref 6–20)
CO2: 37 mmol/L — ABNORMAL HIGH (ref 22–32)
Calcium: 9.3 mg/dL (ref 8.9–10.3)
Chloride: 94 mmol/L — ABNORMAL LOW (ref 101–111)
Creatinine, Ser: 0.77 mg/dL (ref 0.44–1.00)
GFR calc Af Amer: >60 mL/min (ref >60)
GFR calc non Af Amer: >60 mL/min (ref >60)
Glucose, Bld: 124 mg/dL — ABNORMAL HIGH (ref 65–99)
Potassium: 3.5 mmol/L (ref 3.5–5.1)
Sodium: 137 mmol/L (ref 135–145)
Total Bilirubin: 0.6 mg/dL (ref 0.3–1.2)
Total Protein: 6.7 g/dL (ref 6.5–8.1)

## 2017-04-17 LAB — TSH: TSH: 1.174 u[IU]/mL (ref 0.350–4.500)

## 2017-04-17 LAB — T4, FREE: Free T4: 1.19 ng/dL — ABNORMAL HIGH (ref 0.61–1.12)

## 2017-04-17 NOTE — Progress Notes (Signed)
Patient states her right foot swells at times.  She does not sleep well but this is not new for her.  Appetite good.  Paxil has been increased to 40 mg daily since her husband passed away.

## 2017-04-18 ENCOUNTER — Other Ambulatory Visit: Payer: Self-pay | Admitting: Urgent Care

## 2017-04-18 ENCOUNTER — Telehealth: Payer: Self-pay | Admitting: *Deleted

## 2017-04-18 DIAGNOSIS — C50919 Malignant neoplasm of unspecified site of unspecified female breast: Secondary | ICD-10-CM

## 2017-04-18 DIAGNOSIS — R911 Solitary pulmonary nodule: Secondary | ICD-10-CM

## 2017-04-18 LAB — CANCER ANTIGEN 27.29: CA 27.29: 33.4 U/mL (ref 0.0–38.6)

## 2017-04-18 NOTE — Telephone Encounter (Signed)
-----   Message from Karen Kitchens, NP sent at 04/18/2017 10:59 AM EDT ----- Regarding: FW: Referral Can we call and see if she would be agreeable to a non-contrast chest CT. She is not age appropriate for the low-dose program per Shawn. I will enter the order once we know.   Gaspar Bidding ----- Message ----- From: Lieutenant Diego, RN Sent: 04/18/2017  10:44 AM To: Karen Kitchens, NP Subject: RE: Referral                                   I would love to but unfortunately she is not eligible for screening due to age >101. Sorry Gaspar Bidding. ----- Message ----- From: Karen Kitchens, NP Sent: 04/17/2017   4:03 PM To: Lieutenant Diego, RN Subject: Referral                                       Can you touch base with patient? She has a 210 pack year smoking history; stopped smoking this year. She is agreeing to low dose chest CT program.  Thanks, Gaspar Bidding

## 2017-04-18 NOTE — Telephone Encounter (Signed)
Order has been placed for the non-contrast CT scan of the chest. Please schedule exam and call patient with appointment.

## 2017-04-18 NOTE — Telephone Encounter (Signed)
Called patient regarding low dose CT that MD discussed with patient at her clinic visit yesterday.  Per Raquel Sarna, patient is not age appropriate for this type of scan.  MD recommends no contrast CT if patient is in agreement.  Patient states she is in agreement.  Informed her we would get it scheduled and give her a call with date/time.  Verbalized understanding.

## 2017-04-20 ENCOUNTER — Encounter: Payer: Self-pay | Admitting: Hematology and Oncology

## 2017-04-22 ENCOUNTER — Other Ambulatory Visit: Payer: Self-pay | Admitting: Urology

## 2017-04-25 ENCOUNTER — Ambulatory Visit
Admission: RE | Admit: 2017-04-25 | Discharge: 2017-04-25 | Disposition: A | Discharge status: Home / Self Care | Payer: Medicare Other | Source: Ambulatory Visit | Attending: Urgent Care | Admitting: Urgent Care

## 2017-04-25 DIAGNOSIS — Z902 Acquired absence of lung [part of]: Secondary | ICD-10-CM | POA: Insufficient documentation

## 2017-04-25 DIAGNOSIS — R911 Solitary pulmonary nodule: Secondary | ICD-10-CM

## 2017-04-25 DIAGNOSIS — K228 Other specified diseases of esophagus: Secondary | ICD-10-CM | POA: Diagnosis not present

## 2017-04-25 DIAGNOSIS — J984 Other disorders of lung: Secondary | ICD-10-CM | POA: Diagnosis not present

## 2017-04-25 DIAGNOSIS — R918 Other nonspecific abnormal finding of lung field: Secondary | ICD-10-CM | POA: Insufficient documentation

## 2017-04-25 DIAGNOSIS — C50919 Malignant neoplasm of unspecified site of unspecified female breast: Secondary | ICD-10-CM | POA: Diagnosis present

## 2017-04-25 DIAGNOSIS — I700 Atherosclerosis of aorta: Secondary | ICD-10-CM | POA: Diagnosis not present

## 2017-04-25 DIAGNOSIS — I288 Other diseases of pulmonary vessels: Secondary | ICD-10-CM | POA: Diagnosis not present

## 2017-05-26 ENCOUNTER — Other Ambulatory Visit: Payer: Self-pay | Admitting: Urology

## 2017-05-26 DIAGNOSIS — Z170 Estrogen receptor positive status [ER+]: Principal | ICD-10-CM

## 2017-05-26 DIAGNOSIS — C50911 Malignant neoplasm of unspecified site of right female breast: Secondary | ICD-10-CM

## 2017-05-26 DIAGNOSIS — C73 Malignant neoplasm of thyroid gland: Secondary | ICD-10-CM

## 2017-06-06 ENCOUNTER — Other Ambulatory Visit: Payer: Self-pay | Admitting: Urology

## 2017-06-12 ENCOUNTER — Other Ambulatory Visit: Payer: Self-pay | Admitting: Urology

## 2017-06-17 ENCOUNTER — Other Ambulatory Visit: Payer: Self-pay | Admitting: Urology

## 2017-06-18 ENCOUNTER — Other Ambulatory Visit: Payer: Self-pay | Admitting: Urology

## 2017-06-27 ENCOUNTER — Other Ambulatory Visit: Payer: Self-pay | Admitting: Urology

## 2017-06-27 DIAGNOSIS — C73 Malignant neoplasm of thyroid gland: Secondary | ICD-10-CM

## 2017-06-27 DIAGNOSIS — C50911 Malignant neoplasm of unspecified site of right female breast: Secondary | ICD-10-CM

## 2017-06-27 DIAGNOSIS — Z170 Estrogen receptor positive status [ER+]: Principal | ICD-10-CM

## 2017-07-03 ENCOUNTER — Other Ambulatory Visit: Payer: Self-pay | Admitting: Urology

## 2017-10-16 ENCOUNTER — Inpatient Hospital Stay: Payer: Medicare Other

## 2017-10-16 ENCOUNTER — Other Ambulatory Visit: Payer: Self-pay | Admitting: Hematology and Oncology

## 2017-10-16 ENCOUNTER — Inpatient Hospital Stay: Payer: Medicare Other | Attending: Hematology and Oncology | Admitting: Hematology and Oncology

## 2017-10-16 ENCOUNTER — Encounter: Payer: Self-pay | Admitting: Hematology and Oncology

## 2017-10-16 VITALS — BP 110/67 | HR 70 | Temp 95.5°F | Resp 18 | Wt 143.5 lb

## 2017-10-16 DIAGNOSIS — Z170 Estrogen receptor positive status [ER+]: Secondary | ICD-10-CM

## 2017-10-16 DIAGNOSIS — Z85850 Personal history of malignant neoplasm of thyroid: Secondary | ICD-10-CM | POA: Diagnosis not present

## 2017-10-16 DIAGNOSIS — C50911 Malignant neoplasm of unspecified site of right female breast: Secondary | ICD-10-CM

## 2017-10-16 DIAGNOSIS — R978 Other abnormal tumor markers: Secondary | ICD-10-CM | POA: Diagnosis not present

## 2017-10-16 DIAGNOSIS — Z853 Personal history of malignant neoplasm of breast: Secondary | ICD-10-CM

## 2017-10-16 DIAGNOSIS — C73 Malignant neoplasm of thyroid gland: Secondary | ICD-10-CM

## 2017-10-16 LAB — T4, FREE: Free T4: 1.11 ng/dL (ref 0.61–1.12)

## 2017-10-16 LAB — CBC WITH DIFFERENTIAL/PLATELET
Basophils Absolute: 0 10*3/uL (ref 0–0.1)
Basophils Relative: 1 %
Eosinophils Absolute: 0.2 10*3/uL (ref 0–0.7)
Eosinophils Relative: 4 %
HCT: 39.8 % (ref 35.0–47.0)
Hemoglobin: 13.3 g/dL (ref 12.0–16.0)
Lymphocytes Relative: 20 %
Lymphs Abs: 1.1 10*3/uL (ref 1.0–3.6)
MCH: 27.5 pg (ref 26.0–34.0)
MCHC: 33.3 g/dL (ref 32.0–36.0)
MCV: 82.8 fL (ref 80.0–100.0)
Monocytes Absolute: 0.4 10*3/uL (ref 0.2–0.9)
Monocytes Relative: 8 %
Neutro Abs: 3.7 10*3/uL (ref 1.4–6.5)
Neutrophils Relative %: 67 %
Platelets: 239 10*3/uL (ref 150–440)
RBC: 4.81 MIL/uL (ref 3.80–5.20)
RDW: 14.3 % (ref 11.5–14.5)
WBC: 5.4 10*3/uL (ref 3.6–11.0)

## 2017-10-16 LAB — COMPREHENSIVE METABOLIC PANEL
ALT: 12 U/L — ABNORMAL LOW (ref 14–54)
AST: 20 U/L (ref 15–41)
Albumin: 3.7 g/dL (ref 3.5–5.0)
Alkaline Phosphatase: 84 U/L (ref 38–126)
Anion gap: 7 (ref 5–15)
BUN: 17 mg/dL (ref 6–20)
CO2: 34 mmol/L — ABNORMAL HIGH (ref 22–32)
Calcium: 9.2 mg/dL (ref 8.9–10.3)
Chloride: 94 mmol/L — ABNORMAL LOW (ref 101–111)
Creatinine, Ser: 0.74 mg/dL (ref 0.44–1.00)
GFR calc Af Amer: >60 mL/min (ref >60)
GFR calc non Af Amer: >60 mL/min (ref >60)
Glucose, Bld: 137 mg/dL — ABNORMAL HIGH (ref 65–99)
Potassium: 3.4 mmol/L — ABNORMAL LOW (ref 3.5–5.1)
Sodium: 135 mmol/L (ref 135–145)
Total Bilirubin: 0.5 mg/dL (ref 0.3–1.2)
Total Protein: 6.9 g/dL (ref 6.5–8.1)

## 2017-10-16 LAB — TSH: TSH: 3.042 u[IU]/mL (ref 0.350–4.500)

## 2017-10-16 NOTE — Progress Notes (Signed)
[facility] day:  10/16/17   Chief Complaint: Rose Irwin is a 84 y.o. female with a history of thyroid carcinoma and breast cancer who is seen for 6 month assessment.  HPI: The patient was last seen in the medical oncology clinic on 04/17/2017.  At that time, she was doing "ok". She had RIGHT knee and ankle pain. Exam was stable. CBC and chemistries were unremarkable. TSH was 1.174.  Free T4 was 1.19.  Chest CT without contrast on 04/25/2017 revealed a 6.2 cm upper mediastinal mass that was contiguous with and may arise from the left posterior esophageal wall (stable when compared to the prior CT).  There was tree-in-bud type opacities in the peripheral right lower lobe and to lesser degree, right middle lobe, increased when compared the prior exam. An atypical infection such as MAI should be considered.  There was mild stable areas of lung scarring. There were stable changes from a previous right lung resection. There was chronic scarring in the posterior right lower lobe.  There was enlarged pulmonary artery suggesting pulmonary hypertension stable from the prior exam.  There was aortic atherosclerosis.  During the interim, she has felt "ok". For the past week or so, she has felt better than she has in a long time.  She describes getting sick the first week in January.  She got a "cold".  She was treated with doxycycline.  She states that she is planning on moving to [facility].  Her son lives there.  Her house is packed.  Her car is in the shop.   Past Medical History:  Diagnosis Date  . Anxiety   . Arthritis   . Breast cancer ([place]) 11/21/2011   right breast cancer - radiation  . Bronchitis   . Cancer [facility])    breast, thyroid  . Complication of anesthesia    smothering  . COPD (chronic obstructive pulmonary disease) ([place])   . Depression   . Elevated lipids   . GERD (gastroesophageal reflux disease)   . Hemorrhoids   . Hypertension    . Hypothyroidism   . Neuropathy    rt leg  . Osteopenia   . Skin cancer     Past Surgical History:  Procedure Laterality Date  . ABDOMINAL HYSTERECTOMY    . BACK SURGERY     lumbar  . bladder polyps    . BREAST SURGERY Right   . CYSTOSCOPY W/ RETROGRADES Bilateral 02/09/2015   Procedure: CYSTOSCOPY WITH RETROGRADE PYELOGRAM;  Surgeon: Hollice Espy, MD;  Location: [facility] ORS;  Service: Urology;  Laterality: Bilateral;  . LUNG SURGERY    . THYROID SURGERY      Family History  Problem Relation Age of Onset  . Breast cancer Mother   . Breast cancer Sister   . Breast cancer Sister     Social History:  reports that she quit smoking about 3 years ago. Her smoking use included cigarettes. She has a 210.00 pack-year smoking history. She has never used smokeless tobacco. She reports that she does not drink alcohol or use drugs.  She stopped smoking in 2017/01/16. Her husband died. She plans to move to [facility] where her son lives.  The patient is alone today.  Allergies:  Allergies  Allergen Reactions  . Prednisone Rash  . Codeine Other (See Comments)    Go limp, sweat Low energy, unable to function.  . Sulfa Antibiotics Swelling    tongue  . Aspirin Palpitations  Current Medications: Current Outpatient Medications  Medication Sig Dispense Refill  . albuterol (PROAIR HFA) 108 (90 Base) MCG/ACT inhaler Inhale 2 puffs into the lungs every 4 (four) hours as needed for wheezing or shortness of breath. 1 Inhaler 1  . albuterol-ipratropium (COMBIVENT) 18-103 MCG/ACT inhaler Inhale 1 puff into the lungs every 4 (four) hours as needed for wheezing or shortness of breath.    . ALPRAZolam (XANAX) 0.5 MG tablet TAKE ONE TABLET TWICE DAILY AS NEEDED FOR ANXIETY 60 tablet 0  . amLODipine (NORVASC) 2.5 MG tablet Take 2.5 mg by mouth daily.    Marland Kitchen aspirin EC 81 MG tablet Take 81 mg by mouth daily.    . COMBIVENT RESPIMAT 20-100 MCG/ACT AERS respimat     . Fluticasone-Salmeterol (ADVAIR  DISKUS) 250-50 MCG/DOSE AEPB Inhale into the lungs.    . hydrochlorothiazide (HYDRODIURIL) 12.5 MG tablet Take 12.5 mg by mouth daily.    . metoprolol (LOPRESSOR) 50 MG tablet Take 50 mg by mouth 2 (two) times daily.    Marland Kitchen PARoxetine (PAXIL) 20 MG tablet Take 20 mg by mouth daily.    . simvastatin (ZOCOR) 20 MG tablet Take 20 mg by mouth daily at 6 PM.     . SYNTHROID 88 MCG tablet TAKE ONE TABLET BY MOUTH EVERY MORNING BEFORE BREAKFAST 30 tablet 3  . traZODone (DESYREL) 50 MG tablet Take 25 mg by mouth at bedtime as needed for sleep.    Marland Kitchen triamcinolone ointment (KENALOG) 0.1 % Reported on 01/11/2016     No current facility-administered medications for this visit.     Review of Systems:  GENERAL:  Feels "better than she has in a long time".  No fevers or sweats.  Weight down 1 pound. PERFORMANCE STATUS (ECOG):  1 HEENT:  No visual changes, runny nose, sore throat, mouth sores or tenderness. Lungs: No shortness of breath or cough.  No hemoptysis. Interval "cold" (see HPI). 2 liters O2 via [place] at night. Cardiac:  No chest pain, palpitations, orthopnea, or PND. GI:  Choking spells once in awhile.  Not a big eater.  No nausea, vomiting, diarrhea, constipation, melena or hematochezia. GU:  No urgency, frequency, dysuria, or hematuria. Musculoskeletal:   Chronic back pain (h/o back surgery; DDD).  No joint pain.  No muscle tenderness. Extremities:  RIGHT knee and ankle pain; osteoarthritis. No swelling. Skin:  No rashes or skin changes. Neuro:  Sciatic nerve issues (shooting pain down right leg).  Right leg gives away.  No headache, numbness or weakness, balance or coordination issues. Endocrine:  No diabetes.  h/o thyroid cancer.  No hot flashes or night sweats. Psych:  No mood changes, depression or anxiety. Pain:  No focal pain. Review of systems:  All other systems reviewed and found to be negative.  Physical Exam: Blood pressure 110/67, pulse 70, temperature (!) 95.5 F (35.3 C), resp.  rate 18, weight 143 lb 8.3 oz (65.1 kg). GENERAL:  Thin elderly woman sitting comfortably in the exam room in no acute distress.  She has a cane at her side. MENTAL STATUS:  Alert and oriented to person, place and time. HEAD:  Curly gray hair.  Normocephalic, atraumatic, face symmetric, no Cushingoid features. EYES:  Brown eyes.  Pupils equal round and reactive to light and accomodation.  No conjunctivitis or scleral icterus. ENT:  Oropharynx clear without lesion.  Tongue normal. Mucous membranes moist.  RESPIRATORY:  Scattered soft wheezing bilaterally. No rales or rhonchi. CARDIOVASCULAR:  Regular rate and rhythm without murmur, rub  or gallop. ABDOMEN:  Soft, non-tender, with active bowel sounds, and no hepatosplenomegaly.  No masses. SKIN:  No rashes, ulcers or lesions. EXTREMITIES: No edema, no skin discoloration or tenderness.  No palpable cords. LYMPH NODES: No palpable cervical, supraclavicular, axillary or inguinal adenopathy  NEUROLOGICAL: Unremarkable. PSYCH:  Appropriate   Appointment on 10/16/2017  Component Date Value Ref Range Status  . Free T4 10/16/2017 1.11  0.61 - 1.12 ng/dL Final   Comment: (NOTE) Biotin ingestion may interfere with free T4 tests. If the results are inconsistent with the TSH level, previous test results, or the clinical presentation, then consider biotin interference. If needed, order repeat testing after stopping biotin. Performed at [facility], [address], [place], [place] [address]   . TSH 10/16/2017 3.042  0.350 - 4.500 uIU/mL Final   Comment: Performed by a 3rd Generation assay with a functional sensitivity of <=0.01 uIU/mL. Performed at [facility], [address] [place], [place] [address]   . CA 27.29 10/16/2017 43.4* 0.0 - 38.6 U/mL Final   Comment: (NOTE) Siemens Centaur Immunochemiluminometric Methodology [facility]) Values obtained with different assay methods or kits cannot be used interchangeably. Results  cannot be interpreted as absolute evidence of the presence or absence of malignant disease. Performed At: [facility] [place], [place] [id number] Rush Farmer MD [id number]:[id number] Performed at [facility], [address] [place], [place] [address]   . Sodium 10/16/2017 135  135 - 145 mmol/L Final  . Potassium 10/16/2017 3.4* 3.5 - 5.1 mmol/L Final  . Chloride 10/16/2017 94* 101 - 111 mmol/L Final  . CO2 10/16/2017 34* 22 - 32 mmol/L Final  . Glucose, Bld 10/16/2017 137* 65 - 99 mg/dL Final  . BUN 10/16/2017 17  6 - 20 mg/dL Final  . Creatinine, Ser 10/16/2017 0.74  0.44 - 1.00 mg/dL Final  . Calcium 10/16/2017 9.2  8.9 - 10.3 mg/dL Final  . Total Protein 10/16/2017 6.9  6.5 - 8.1 g/dL Final  . Albumin 10/16/2017 3.7  3.5 - 5.0 g/dL Final  . AST 10/16/2017 20  15 - 41 U/L Final  . ALT 10/16/2017 12* 14 - 54 U/L Final  . Alkaline Phosphatase 10/16/2017 84  38 - 126 U/L Final  . Total Bilirubin 10/16/2017 0.5  0.3 - 1.2 mg/dL Final  . GFR calc non Af Amer 10/16/2017 >60  >60 mL/min Final  . GFR calc Af Amer 10/16/2017 >60  >60 mL/min Final   Comment: (NOTE) The eGFR has been calculated using the CKD EPI equation. This calculation has not been validated in all clinical situations. eGFR's persistently <60 mL/min signify possible Chronic Kidney Disease.   Georgiann Hahn gap 10/16/2017 7  5 - 15 Final   Performed at [facility], [address], [place], [place] [address]  . WBC 10/16/2017 5.4  3.6 - 11.0 K/uL Final  . RBC 10/16/2017 4.81  3.80 - 5.20 MIL/uL Final  . Hemoglobin 10/16/2017 13.3  12.0 - 16.0 g/dL Final  . HCT 10/16/2017 39.8  35.0 - 47.0 % Final  . MCV 10/16/2017 82.8  80.0 - 100.0 fL Final  . MCH 10/16/2017 27.5  26.0 - 34.0 pg Final  . MCHC 10/16/2017 33.3  32.0 - 36.0 g/dL Final  . RDW 10/16/2017 14.3  11.5 - 14.5 % Final  . Platelets 10/16/2017 239  150 - 440 K/uL Final  . Neutrophils Relative % 10/16/2017 67  % Final  .  Neutro Abs 10/16/2017 3.7  1.4 - 6.5 K/uL Final  . Lymphocytes Relative 10/16/2017 20  % Final  . Lymphs Abs 10/16/2017 1.1  1.0 - 3.6 K/uL Final  . Monocytes Relative 10/16/2017 8  % Final  . Monocytes Absolute 10/16/2017 0.4  0.2 - 0.9 K/uL Final  . Eosinophils Relative 10/16/2017 4  % Final  . Eosinophils Absolute 10/16/2017 0.2  0 - 0.7 K/uL Final  . Basophils Relative 10/16/2017 1  % Final  . Basophils Absolute 10/16/2017 0.0  0 - 0.1 K/uL Final   Performed at [facility], [address] [place], [place] [address]    Assessment:  Rose Irwin is a 84 y.o. female with a history of stage IB right breast cancer (2013) and thyroid cancer (1999).  She presented with neck fullness.  She underwent thyrodectomy and I-131.  She has a substernal goiter.  Chest CT on 08/01/2011 revealed a 3.8 x 3.9 x 5.7 cm mass in the superior mediastinum lying posterior to the trachea and displacing the esophagus toward the right along its upper portion.  More distally the thoracic esophagus appears normal. Decision was made for observation  She was diagnosed with right breast cancer in 10/2011.  Pathology revealed a T1bN0M0 tumor which was ER positive, PR posiitive and her2/neu negative.  She underwent lumpectomy followed by radiation.  She began Arimidex after radiation.  She discontinued Arimidex in 2016.  She declined further hormonal therapy.    Chest CT on 04/25/2017 revealed a 6.2 cm upper mediastinal mass that was contiguous with and may arise from the left posterior esophageal wall (stable).  There was tree-in-bud type opacities in the peripheral right lower lobe and to lesser degree, right middle lobe, increased when compared the prior exam. There was mild stable areas of lung scarring. There were stable changes from a previous right lung resection. There was chronic scarring in the posterior right lower lobe.  There was enlarged pulmonary artery suggesting pulmonary hypertension  (stable).  Bilateral mammogram on 12/28/2016 revealed no evidence of malignancy.   CA27.29 has been followed:  27.6 on 06/13/2012, 35.6 on 05/04/2015, 33.4 on 11/02/2015, 30.2 on 03/14/2016, 28.1 on 11/21/2016, 33.4 on 04/17/2017, and 43.4 on 10/16/2017.  She has a history of esophageal dilatation x 2 (last 15 years ago).  Chest CT on 11/09/2015 revealed a 3.7 x 4.0 mediastinal mass posterior to the mid trachea which was stable in size from 08/01/2011.  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 It exerted significant mass effect on the upper esophagus.    Barium swallow on 11/21/2015 revealed deviation of the cervical esophagus to the right from previously identified paratracheal mass lesion.  There were no motility issues with her esophagus.   I-131 scan with Thyrogen on 12/03/2015 revealed a large focus of intense radiotracer uptake within the superior mediastinum corresponding to the mediastinal mass identified on chest CT and compatible with residual functioning thyroid tissue (recurrent thyroid cancer or normal thyroid tissue).  She is not a surgical candidate.  Symptomatically, patient is doing "well".  Exam is stable.  She is relocating to [place], [place].  Plan: 1.  Labs today:  CBC with diff, CMP, CA27.29, TSH, free T4. 2.  Review interval chest CT.  Mass is stable.  There are tree-in-bud opacities of unclear significance (? atypical infection).  3.  Discuss non-contrast chest CT in 1 week to reassess lung findings prior to move. 4.  Anticipate next mammogram on 12/28/2017. 5.  RTC in 2  weeks for MD assessment and review of chest CT.  Addendum:  CA27.29 is slightly elevated.  Patient asymptomatic.  Will recheck in 1 month.   Lequita Asal, MD  10/16/2017, 6:35 PM

## 2017-10-16 NOTE — Progress Notes (Signed)
Patient states she has arthritis pain in her right leg.  She states she fell 2 weeks ago when her leg "gave way".  She has seen orthopedics regarding this.  She states her energy level is very low.

## 2017-10-17 LAB — CA 27.29 (SERIAL MONITOR): CA 27.29: 43.4 U/mL — ABNORMAL HIGH (ref 0.0–38.6)

## 2017-10-21 ENCOUNTER — Ambulatory Visit
Admission: RE | Admit: 2017-10-21 | Discharge: 2017-10-21 | Disposition: A | Discharge status: Home / Self Care | Payer: Medicare Other | Source: Ambulatory Visit | Attending: Hematology and Oncology | Admitting: Hematology and Oncology

## 2017-10-21 ENCOUNTER — Other Ambulatory Visit: Payer: Self-pay | Admitting: *Deleted

## 2017-10-21 ENCOUNTER — Telehealth: Payer: Self-pay | Admitting: *Deleted

## 2017-10-21 DIAGNOSIS — R918 Other nonspecific abnormal finding of lung field: Secondary | ICD-10-CM | POA: Insufficient documentation

## 2017-10-21 DIAGNOSIS — I700 Atherosclerosis of aorta: Secondary | ICD-10-CM | POA: Insufficient documentation

## 2017-10-21 DIAGNOSIS — I2510 Atherosclerotic heart disease of native coronary artery without angina pectoris: Secondary | ICD-10-CM | POA: Insufficient documentation

## 2017-10-21 DIAGNOSIS — J479 Bronchiectasis, uncomplicated: Secondary | ICD-10-CM | POA: Insufficient documentation

## 2017-10-21 DIAGNOSIS — C73 Malignant neoplasm of thyroid gland: Secondary | ICD-10-CM | POA: Insufficient documentation

## 2017-10-21 NOTE — Telephone Encounter (Signed)
Called patient to inform her that CA27.29 is slightly elevated from 6 months ago.  MD recommends rechecking in one month.  Forwarded message to scheduling to contact patient.

## 2017-10-21 NOTE — Telephone Encounter (Signed)
-----   Message from Lequita Asal, MD sent at 10/19/2017  6:26 PM EDT ----- Regarding: Please call patient  CA27.29 is slightly elevated.  Recheck in 1 month.  M  ----- Message ----- From: Interface, Lab In [place] Sent: 10/16/2017  10:48 AM To: Lequita Asal, MD

## 2017-10-29 NOTE — Progress Notes (Signed)
[facility] day:  10/30/2017    Chief Complaint: Rose Irwin is a 84 y.o. female with a history of thyroid carcinoma and breast cancer who is seen for 2 week assessment and to review CT results.   HPI: The patient was last seen in the medical oncology clinic on 10/16/2017.  At that time, patient felt "okay".  She noted that for the past week or so she had felt better than she had in a long time.  Minor illness back and first part of January 2019.  Patient with plans to move to [place] in the near future.  Noncontrast CT was ordered.  Potassium slightly low at 3.4.  CA27.29 elevated at 43.4 (previously 33.4).   Non-contrast CT of the chest done on 10/21/2017 revealing a low-attenuation mass posterior to the trachea that likely arises from the left lobe of the thyroid measuring 3.3 x 4.6 cm, with an internal thin calcified septation. The esophagus was displaced to the right.  Findings were stable as compared to prior exams.  Mediastinal lymph nodes were not enlarged by CT criteria.  Hilar regions was difficult to evaluate in the absence of IV contrast.  Basilar predominant peribronchovascular nodularity and bronchiectasis, similar and likely due to mycobacterium avium complex (MAI).  Enlarged pulmonary arteries were consistent with pulmonary arterial hypertension.  Symptomatically, she denies any new complaints.   Past Medical History:  Diagnosis Date  . Anxiety   . Arthritis   . Breast cancer ([place]) 11/21/2011   right breast cancer - radiation  . Bronchitis   . Cancer [facility])    breast, thyroid  . Complication of anesthesia    smothering  . COPD (chronic obstructive pulmonary disease) ([place])   . Depression   . Elevated lipids   . GERD (gastroesophageal reflux disease)   . Hemorrhoids   . Hypertension   . Hypothyroidism   . Neuropathy    rt leg  . Osteopenia   . Skin cancer     Past Surgical History:  Procedure Laterality Date  .  ABDOMINAL HYSTERECTOMY    . BACK SURGERY     lumbar  . bladder polyps    . BREAST SURGERY Right   . CYSTOSCOPY W/ RETROGRADES Bilateral 02/09/2015   Procedure: CYSTOSCOPY WITH RETROGRADE PYELOGRAM;  Surgeon: Hollice Espy, MD;  Location: [facility] ORS;  Service: Urology;  Laterality: Bilateral;  . LUNG SURGERY    . THYROID SURGERY      Family History  Problem Relation Age of Onset  . Breast cancer Mother   . Breast cancer Sister   . Breast cancer Sister     Social History:  reports that she quit smoking about 3 years ago. Her smoking use included cigarettes. She has a 210.00 pack-year smoking history. She has never used smokeless tobacco. She reports that she does not drink alcohol or use drugs.  She stopped smoking in 01-06-2017. Her husband died. She plans to move to [facility] where her son lives.  The patient is accompanied by her sister, Apolonio Schneiders, today.  Allergies:  Allergies  Allergen Reactions  . Prednisone Rash  . Codeine Other (See Comments)    Go limp, sweat Low energy, unable to function. Go limp, sweat  . Sulfa Antibiotics Swelling    tongue  . Aspirin Palpitations    Current Medications: Current Outpatient Medications  Medication Sig Dispense Refill  . albuterol (PROAIR HFA) 108 (90 Base) MCG/ACT inhaler Inhale 2 puffs into the  lungs every 4 (four) hours as needed for wheezing or shortness of breath. 1 Inhaler 1  . albuterol-ipratropium (COMBIVENT) 18-103 MCG/ACT inhaler Inhale 1 puff into the lungs every 4 (four) hours as needed for wheezing or shortness of breath.    . ALPRAZolam (XANAX) 0.5 MG tablet TAKE ONE TABLET TWICE DAILY AS NEEDED FOR ANXIETY 60 tablet 0  . amLODipine (NORVASC) 5 MG tablet Take 5 mg by mouth daily.    Marland Kitchen aspirin EC 81 MG tablet Take 81 mg by mouth daily.    . COMBIVENT RESPIMAT 20-100 MCG/ACT AERS respimat     . Fluticasone-Salmeterol (ADVAIR DISKUS) 250-50 MCG/DOSE AEPB Inhale into the lungs.    . hydrochlorothiazide (HYDRODIURIL) 25 MG  tablet Take 25 mg by mouth daily.    . metoprolol (LOPRESSOR) 50 MG tablet Take 50 mg by mouth 2 (two) times daily.    Marland Kitchen PARoxetine (PAXIL) 20 MG tablet Take 20 mg by mouth daily.    . simvastatin (ZOCOR) 20 MG tablet Take 20 mg by mouth daily at 6 PM.     . SYNTHROID 88 MCG tablet TAKE ONE TABLET BY MOUTH EVERY MORNING BEFORE BREAKFAST 30 tablet 3  . traZODone (DESYREL) 50 MG tablet Take 25 mg by mouth at bedtime as needed for sleep.    Marland Kitchen triamcinolone ointment (KENALOG) 0.1 % Reported on 01/11/2016     No current facility-administered medications for this visit.     Review of Systems:  GENERAL:  Feels good. No fevers, sweats or weight loss. PERFORMANCE STATUS (ECOG):  1 HEENT:  No visual changes, runny nose, sore throat, mouth sores or tenderness. Lungs: No shortness of breath or cough.  No hemoptysis. On oxygen 2 liters/min at night. Cardiac:  No chest pain, palpitations, orthopnea, or PND. GI:  Intermittent choking spells.  No nausea, vomiting, diarrhea, constipation, melena or hematochezia. GU:  No urgency, frequency, dysuria, or hematuria. Musculoskeletal:  Chronic back pain (DDD).  No joint pain.  No muscle tenderness. Extremities:  Right knee and ankle pain.  No swelling. Skin:  No rashes or skin changes. Neuro:  Sciatic nerve issues (shooting pain down right leg).  No headache, numbness or weakness, balance or coordination issues. Endocrine:  No diabetes.  h/o thyroid cancer.   No hot flashes or night sweats. Psych:  No mood changes, depression or anxiety. Pain:  No focal pain. Review of systems:  All other systems reviewed and found to be negative.   Physical Exam: Pulse (!) 58, temperature (!) 96.2 F (35.7 C), temperature source Tympanic, resp. rate 18, weight 143 lb 11.8 oz (65.2 kg). GENERAL:  Thin elderly woman sitting comfortably in the exam room in no acute distress.  She has a cane at her side. MENTAL STATUS:  Alert and oriented to person, place and time. HEAD:   Curly gray hair.  Normocephalic, atraumatic, face symmetric, no Cushingoid features. EYES:  Brown eyes.  No conjunctivitis or scleral icterus. NEUROLOGICAL: Unremarkable. PSYCH:  Appropriate.    No visits with results within 3 Day(s) from this visit.  Latest known visit with results is:  Appointment on 10/16/2017  Component Date Value Ref Range Status  . Free T4 10/16/2017 1.11  0.61 - 1.12 ng/dL Final   Comment: (NOTE) Biotin ingestion may interfere with free T4 tests. If the results are inconsistent with the TSH level, previous test results, or the clinical presentation, then consider biotin interference. If needed, order repeat testing after stopping biotin. Performed at [facility], [address] [place], [place] [address]   . TSH 10/16/2017 3.042  0.350 - 4.500 uIU/mL Final   Comment: Performed by a 3rd Generation assay with a functional sensitivity of <=0.01 uIU/mL. Performed at [facility], [address] [place], [place] [address]   . CA 27.29 10/16/2017 43.4* 0.0 - 38.6 U/mL Final   Comment: (NOTE) Siemens Centaur Immunochemiluminometric Methodology [facility]) Values obtained with different assay methods or kits cannot be used interchangeably. Results cannot be interpreted as absolute evidence of the presence or absence of malignant disease. Performed At: [facility] [place], [place] [id number] Rush Farmer MD [id number] Performed at [facility], [address] [place], [place] [address]   . Sodium 10/16/2017 135  135 - 145 mmol/L Final  . Potassium 10/16/2017 3.4* 3.5 - 5.1 mmol/L Final  . Chloride 10/16/2017 94* 101 - 111 mmol/L Final  . CO2 10/16/2017 34* 22 - 32 mmol/L Final  . Glucose, Bld 10/16/2017 137* 65 - 99 mg/dL Final  . BUN 10/16/2017 17  6 - 20 mg/dL Final  . Creatinine, Ser 10/16/2017 0.74  0.44 - 1.00 mg/dL Final  . Calcium 10/16/2017 9.2  8.9 - 10.3 mg/dL Final  . Total Protein  10/16/2017 6.9  6.5 - 8.1 g/dL Final  . Albumin 10/16/2017 3.7  3.5 - 5.0 g/dL Final  . AST 10/16/2017 20  15 - 41 U/L Final  . ALT 10/16/2017 12* 14 - 54 U/L Final  . Alkaline Phosphatase 10/16/2017 84  38 - 126 U/L Final  . Total Bilirubin 10/16/2017 0.5  0.3 - 1.2 mg/dL Final  . GFR calc non Af Amer 10/16/2017 >60  >60 mL/min Final  . GFR calc Af Amer 10/16/2017 >60  >60 mL/min Final   Comment: (NOTE) The eGFR has been calculated using the CKD EPI equation. This calculation has not been validated in all clinical situations. eGFR's persistently <60 mL/min signify possible Chronic Kidney Disease.   Georgiann Hahn gap 10/16/2017 7  5 - 15 Final   Performed at [facility], [address], [place], [place] [address]  . WBC 10/16/2017 5.4  3.6 - 11.0 K/uL Final  . RBC 10/16/2017 4.81  3.80 - 5.20 MIL/uL Final  . Hemoglobin 10/16/2017 13.3  12.0 - 16.0 g/dL Final  . HCT 10/16/2017 39.8  35.0 - 47.0 % Final  . MCV 10/16/2017 82.8  80.0 - 100.0 fL Final  . MCH 10/16/2017 27.5  26.0 - 34.0 pg Final  . MCHC 10/16/2017 33.3  32.0 - 36.0 g/dL Final  . RDW 10/16/2017 14.3  11.5 - 14.5 % Final  . Platelets 10/16/2017 239  150 - 440 K/uL Final  . Neutrophils Relative % 10/16/2017 67  % Final  . Neutro Abs 10/16/2017 3.7  1.4 - 6.5 K/uL Final  . Lymphocytes Relative 10/16/2017 20  % Final  . Lymphs Abs 10/16/2017 1.1  1.0 - 3.6 K/uL Final  . Monocytes Relative 10/16/2017 8  % Final  . Monocytes Absolute 10/16/2017 0.4  0.2 - 0.9 K/uL Final  . Eosinophils Relative 10/16/2017 4  % Final  . Eosinophils Absolute 10/16/2017 0.2  0 - 0.7 K/uL Final  . Basophils Relative 10/16/2017 1  % Final  . Basophils Absolute 10/16/2017 0.0  0 - 0.1 K/uL Final   Performed at [facility], [address], [place], [place] [address]    Assessment:  Rose Irwin is a 84 y.o. female with a history of stage IB  right breast cancer (2013) and thyroid cancer (1999).  She presented with neck  fullness.  She underwent thyrodectomy and I-131.  She has a substernal goiter.  Chest CT on 08/01/2011 revealed a 3.8 x 3.9 x 5.7 cm mass in the superior mediastinum lying posterior to the trachea and displacing the esophagus toward the right along its upper portion.  More distally the thoracic esophagus appears normal. Decision was made for observation  She was diagnosed with right breast cancer in 10/2011.  Pathology revealed a T1bN0M0 tumor which was ER positive, PR posiitive and her2/neu negative.  She underwent lumpectomy followed by radiation.  She began Arimidex after radiation.  She discontinued Arimidex in 2016.  She declined further hormonal therapy.    Noncontrast chest CT on 10/21/2017 revealed a low-attenuation mass posterior to the trachea that likely arises from the left lobe of the thyroid measuring 3.3 x 4.6 cm, with an internal thin calcified septation. The esophagus was displaced to the right.  Findings were stable as compared to prior exams.  Mediastinal lymph nodes were not enlarged by CT criteria.  Hilar regions were difficult to evaluate in the absence of IV contrast.  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  Enlarged pulmonary arteries were consistent with pulmonary arterial hypertension.  Bilateral mammogram on 12/28/2016 revealed no evidence of malignancy.   CA27.29 has been followed:  27.6 on 06/13/2012, 35.6 on 05/04/2015, 33.4 on 11/02/2015, 30.2 on 03/14/2016, 28.1 on 11/21/2016, 33.4 on 04/17/2017, and 43.4 on 10/16/2017.  She has a history of esophageal dilatation x 2 (last 15 years ago).  Chest CT on 11/09/2015 revealed a 3.7 x 4.0 mediastinal mass posterior to the mid trachea which was stable in size from 08/01/2011.  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 It exerted significant mass effect on the upper esophagus.    Barium swallow on 11/21/2015  revealed deviation of the cervical esophagus to the right from previously identified paratracheal mass lesion.  There were no motility issues with her esophagus.   I-131 scan with Thyrogen on 12/03/2015 revealed a large focus of intense radiotracer uptake within the superior mediastinum corresponding to the mediastinal mass identified on chest CT and compatible with residual functioning thyroid tissue (recurrent thyroid cancer or normal thyroid tissue).  She is not a surgical candidate.  Symptomatically, she denes any complaints.  Exam is stable.  She plans to relocate to [place], [place].  Plan: 1.  Review interval chest CT.  Overall stable. Basilar predominant peribronchovascular nodularity and bronchiectasis, similar and likely due to mycobacterium avium complex (MAI).  Discuss conversation with pulmonary medicine.  No plan to treat. 2.  Discuss elevated CA27.29.  Significance unclear.  Discuss plan to repeat. 3.  Mammogram on 12/30/2017. 4.  RTC on 11/27/2017 for labs (CA27.29) 5.  RTC after mammogram for MD assessment.   Lequita Asal, MD  10/30/17, 4:35 PM

## 2017-10-30 ENCOUNTER — Encounter: Payer: Self-pay | Admitting: Hematology and Oncology

## 2017-10-30 ENCOUNTER — Inpatient Hospital Stay: Payer: Medicare Other | Attending: Hematology and Oncology | Admitting: Hematology and Oncology

## 2017-10-30 VITALS — HR 58 | Temp 96.2°F | Resp 18 | Wt 143.7 lb

## 2017-10-30 DIAGNOSIS — Z85850 Personal history of malignant neoplasm of thyroid: Secondary | ICD-10-CM | POA: Insufficient documentation

## 2017-10-30 DIAGNOSIS — Z7189 Other specified counseling: Secondary | ICD-10-CM | POA: Insufficient documentation

## 2017-10-30 DIAGNOSIS — R978 Other abnormal tumor markers: Secondary | ICD-10-CM | POA: Insufficient documentation

## 2017-10-30 DIAGNOSIS — J479 Bronchiectasis, uncomplicated: Secondary | ICD-10-CM | POA: Diagnosis not present

## 2017-10-30 DIAGNOSIS — E049 Nontoxic goiter, unspecified: Secondary | ICD-10-CM | POA: Diagnosis not present

## 2017-10-30 DIAGNOSIS — Z87891 Personal history of nicotine dependence: Secondary | ICD-10-CM | POA: Diagnosis not present

## 2017-10-30 DIAGNOSIS — Z853 Personal history of malignant neoplasm of breast: Secondary | ICD-10-CM

## 2017-10-30 DIAGNOSIS — C50911 Malignant neoplasm of unspecified site of right female breast: Secondary | ICD-10-CM

## 2017-10-30 DIAGNOSIS — Z170 Estrogen receptor positive status [ER+]: Secondary | ICD-10-CM

## 2017-10-30 DIAGNOSIS — C73 Malignant neoplasm of thyroid gland: Secondary | ICD-10-CM

## 2017-10-30 NOTE — Progress Notes (Signed)
Patient offers no complaints today. 

## 2017-11-20 ENCOUNTER — Other Ambulatory Visit: Payer: Medicare Other

## 2017-11-27 ENCOUNTER — Inpatient Hospital Stay: Payer: Medicare Other | Attending: Hematology and Oncology

## 2017-11-27 DIAGNOSIS — Z853 Personal history of malignant neoplasm of breast: Secondary | ICD-10-CM | POA: Diagnosis present

## 2017-11-27 DIAGNOSIS — Z85850 Personal history of malignant neoplasm of thyroid: Secondary | ICD-10-CM | POA: Diagnosis not present

## 2017-11-27 DIAGNOSIS — C73 Malignant neoplasm of thyroid gland: Secondary | ICD-10-CM

## 2017-11-28 ENCOUNTER — Telehealth: Payer: Self-pay | Admitting: *Deleted

## 2017-11-28 LAB — CA 27.29 (SERIAL MONITOR): CA 27.29: 32.1 U/mL (ref 0.0–38.6)

## 2017-11-28 NOTE — Telephone Encounter (Signed)
Called patient and LVM to inform her that tumor marker is normal.

## 2017-11-28 NOTE — Telephone Encounter (Signed)
-----   Message from Lequita Asal, MD sent at 11/28/2017  1:48 PM EDT ----- Regarding: Please call patient  CA27.29 is normal.  M  ----- Message ----- From: Interface, Lab In [place] Sent: 11/28/2017  11:38 AM To: Lequita Asal, MD

## 2017-12-03 ENCOUNTER — Encounter (INDEPENDENT_AMBULATORY_CARE_PROVIDER_SITE_OTHER): Payer: Medicare Other | Admitting: Vascular Surgery

## 2017-12-13 ENCOUNTER — Other Ambulatory Visit: Payer: Self-pay | Admitting: Orthopedic Surgery

## 2017-12-13 DIAGNOSIS — M1711 Unilateral primary osteoarthritis, right knee: Secondary | ICD-10-CM

## 2017-12-13 DIAGNOSIS — M21061 Valgus deformity, not elsewhere classified, right knee: Secondary | ICD-10-CM

## 2017-12-23 ENCOUNTER — Ambulatory Visit: Payer: Self-pay | Admitting: Orthopedic Surgery

## 2017-12-30 ENCOUNTER — Ambulatory Visit
Admission: RE | Admit: 2017-12-30 | Discharge: 2017-12-30 | Disposition: A | Discharge status: Home / Self Care | Payer: Medicare Other | Source: Ambulatory Visit | Attending: Orthopedic Surgery | Admitting: Orthopedic Surgery

## 2017-12-30 ENCOUNTER — Other Ambulatory Visit: Payer: Self-pay | Admitting: Orthopedic Surgery

## 2017-12-30 DIAGNOSIS — S83271A Complex tear of lateral meniscus, current injury, right knee, initial encounter: Secondary | ICD-10-CM | POA: Insufficient documentation

## 2017-12-30 DIAGNOSIS — M1711 Unilateral primary osteoarthritis, right knee: Secondary | ICD-10-CM

## 2017-12-30 DIAGNOSIS — M25461 Effusion, right knee: Secondary | ICD-10-CM | POA: Diagnosis not present

## 2017-12-30 DIAGNOSIS — M7121 Synovial cyst of popliteal space [Baker], right knee: Secondary | ICD-10-CM | POA: Diagnosis not present

## 2017-12-30 DIAGNOSIS — M25761 Osteophyte, right knee: Secondary | ICD-10-CM | POA: Insufficient documentation

## 2017-12-30 DIAGNOSIS — M948X6 Other specified disorders of cartilage, lower leg: Secondary | ICD-10-CM | POA: Insufficient documentation

## 2017-12-30 DIAGNOSIS — M2341 Loose body in knee, right knee: Secondary | ICD-10-CM | POA: Diagnosis not present

## 2017-12-30 DIAGNOSIS — M21061 Valgus deformity, not elsewhere classified, right knee: Secondary | ICD-10-CM

## 2017-12-31 ENCOUNTER — Ambulatory Visit
Admission: RE | Admit: 2017-12-31 | Discharge: 2017-12-31 | Disposition: A | Discharge status: Home / Self Care | Payer: Medicare Other | Source: Ambulatory Visit | Attending: Hematology and Oncology | Admitting: Hematology and Oncology

## 2017-12-31 DIAGNOSIS — C50911 Malignant neoplasm of unspecified site of right female breast: Secondary | ICD-10-CM

## 2017-12-31 DIAGNOSIS — Z170 Estrogen receptor positive status [ER+]: Secondary | ICD-10-CM | POA: Diagnosis present

## 2017-12-31 HISTORY — DX: Personal history of irradiation: Z92.3

## 2018-01-01 ENCOUNTER — Inpatient Hospital Stay: Payer: Medicare Other | Attending: Hematology and Oncology | Admitting: Hematology and Oncology

## 2018-01-01 ENCOUNTER — Other Ambulatory Visit: Payer: Self-pay

## 2018-01-01 ENCOUNTER — Encounter: Payer: Self-pay | Admitting: Hematology and Oncology

## 2018-01-01 VITALS — BP 113/57 | HR 58 | Temp 98.0°F | Resp 22 | Ht 65.0 in | Wt 144.0 lb

## 2018-01-01 DIAGNOSIS — C73 Malignant neoplasm of thyroid gland: Secondary | ICD-10-CM

## 2018-01-01 DIAGNOSIS — Z923 Personal history of irradiation: Secondary | ICD-10-CM | POA: Diagnosis not present

## 2018-01-01 DIAGNOSIS — Z85828 Personal history of other malignant neoplasm of skin: Secondary | ICD-10-CM | POA: Diagnosis not present

## 2018-01-01 DIAGNOSIS — R978 Other abnormal tumor markers: Secondary | ICD-10-CM | POA: Diagnosis not present

## 2018-01-01 DIAGNOSIS — I10 Essential (primary) hypertension: Secondary | ICD-10-CM | POA: Diagnosis not present

## 2018-01-01 DIAGNOSIS — Z85850 Personal history of malignant neoplasm of thyroid: Secondary | ICD-10-CM | POA: Diagnosis not present

## 2018-01-01 DIAGNOSIS — C50911 Malignant neoplasm of unspecified site of right female breast: Secondary | ICD-10-CM

## 2018-01-01 DIAGNOSIS — Z87891 Personal history of nicotine dependence: Secondary | ICD-10-CM | POA: Diagnosis not present

## 2018-01-01 DIAGNOSIS — Z853 Personal history of malignant neoplasm of breast: Secondary | ICD-10-CM | POA: Diagnosis not present

## 2018-01-01 DIAGNOSIS — Z170 Estrogen receptor positive status [ER+]: Secondary | ICD-10-CM

## 2018-01-01 NOTE — Progress Notes (Signed)
Patient has right knee replacement planned for July. Her mammogram was performed yesterday.

## 2018-01-01 NOTE — Progress Notes (Signed)
[facility] day:  01/01/2018   Chief Complaint: Rose Irwin is a 84 y.o. female with a history of thyroid carcinoma and breast cancer who is seen for review of interval mammogram.  HPI: The patient was last seen in the medical oncology clinic on 10/30/2017.  At that time, she dened any complaints.  Exam was stable.  Chest CT was stable.  She planned to relocate to [facility], [place].  CA27.29 was 43.4 (slightly elevated).  Repeat was 32.1 (normal) on 11/27/2017.  Mammogram on 12/31/2017 revealed no evidence of malignancy in either breast.  There were stable right breast posttreatment changes.  Symptomatically, she notes back and right foot issues.  She is scheduled for right knee replacement on 01/22/2018.  She describes a recent back MRI.   Past Medical History:  Diagnosis Date  . Anxiety   . Arthritis   . Breast cancer ([place]) 11/21/2011   right breast cancer - radiation  . Bronchitis   . Cancer ([place]) 1999    thyroid  . Complication of anesthesia    smothering  . COPD (chronic obstructive pulmonary disease) ([place])   . Depression   . Elevated lipids   . GERD (gastroesophageal reflux disease)   . Hemorrhoids   . Hypertension   . Hypothyroidism   . Neuropathy    rt leg  . Osteopenia   . Personal history of radiation therapy 2013   right breast ca  . Skin cancer     Past Surgical History:  Procedure Laterality Date  . ABDOMINAL HYSTERECTOMY    . BACK SURGERY     lumbar  . bladder polyps    . BREAST BIOPSY Right 10/2011   invasive mammary carcinoma  . BREAST BIOPSY Right yrs ago   benign  . BREAST EXCISIONAL BIOPSY Left 1961, 1970, 1990   benign  . BREAST LUMPECTOMY Right 11/2011   invasive mammary carcinoma, clear margins, negative LN  . BREAST SURGERY Right   . CYSTOSCOPY W/ RETROGRADES Bilateral 02/09/2015   Procedure: CYSTOSCOPY WITH RETROGRADE PYELOGRAM;  Surgeon: Hollice Espy, MD;  Location: [facility] ORS;  Service:  Urology;  Laterality: Bilateral;  . LUNG SURGERY    . THYROID SURGERY      Family History  Problem Relation Age of Onset  . Breast cancer Mother        >50  . Breast cancer Sister        >50  . Breast cancer Sister        >50    Social History:  reports that she quit smoking about 3 years ago. Her smoking use included cigarettes. She has a 210.00 pack-year smoking history. She has never used smokeless tobacco. She reports that she does not drink alcohol or use drugs.  She stopped smoking in 12-30-16. Her husband died. She plans to move to [facility] where her son lives.  The patient is accompanied by her sister, Rose Irwin, today.  Allergies:  Allergies  Allergen Reactions  . Prednisone Rash  . Codeine Other (See Comments)    Go limp, sweat Low energy, unable to function. Go limp, sweat  . Cortisone   . Sulfa Antibiotics Swelling    tongue  . Aspirin Palpitations    Current Medications: Current Outpatient Medications  Medication Sig Dispense Refill  . albuterol (PROAIR HFA) 108 (90 Base) MCG/ACT inhaler Inhale 2 puffs into the lungs every 4 (four) hours as needed for wheezing or shortness of breath. 1 Inhaler  1  . ALPRAZolam (XANAX) 0.5 MG tablet TAKE ONE TABLET TWICE DAILY AS NEEDED FOR ANXIETY 60 tablet 0  . amLODipine (NORVASC) 2.5 MG tablet Take 1 tablet by mouth daily.    Marland Kitchen aspirin EC 81 MG tablet Take 81 mg by mouth daily.    . COMBIVENT RESPIMAT 20-100 MCG/ACT AERS respimat Inhale 1 puff into the lungs every 6 (six) hours as needed for shortness of breath.     . hydrochlorothiazide (HYDRODIURIL) 25 MG tablet Take 25 mg by mouth daily.    Marland Kitchen ipratropium-albuterol (DUONEB) 0.5-2.5 (3) MG/3ML SOLN Inhale 3 mLs into the lungs 4 (four) times daily as needed for wheezing.    . metoprolol (LOPRESSOR) 50 MG tablet Take 50 mg by mouth 2 (two) times daily.    Marland Kitchen PARoxetine (PAXIL) 40 MG tablet Take 40 mg by mouth every morning.  5  . simvastatin (ZOCOR) 20 MG tablet Take 20 mg by  mouth daily at 6 PM.     . SYNTHROID 88 MCG tablet TAKE ONE TABLET BY MOUTH EVERY MORNING BEFORE BREAKFAST 30 tablet 3  . triamcinolone ointment (KENALOG) 0.1 % Reported on 01/11/2016     No current facility-administered medications for this visit.     Review of Systems:  GENERAL:  Feels good.  No fevers or sweats.  Weight up 1 pound. PERFORMANCE STATUS (ECOG):  1 HEENT:  No visual changes, runny nose, sore throat, mouth sores or tenderness. Lungs: No shortness of breath or cough.  No hemoptysis.  On oxygen 2 liters/min at night. Cardiac:  No chest pain, palpitations, orthopnea, or PND. GI:  No nausea, vomiting, diarrhea, constipation, melena or hematochezia. GU:  No urgency, frequency, dysuria, or hematuria. Musculoskeletal:  Chronic back pain (recent MRI).  Right knee issues (surgery planned 01/22/2018).  No muscle tenderness. Extremities:  No pain or swelling. Skin:  No rashes or skin changes. Neuro:  Sciatic nerves issues on right with numbness in foot.  No headache, weakness, balance or coordination issues. Endocrine:  No diabetes.  h/o thyroid cancer.  No hot flashes or night sweats. Psych:  No mood changes, depression or anxiety. Pain:  No focal pain. Review of systems:  All other systems reviewed and found to be negative.    Physical Exam: Blood pressure (!) 113/57, pulse (!) 58, temperature 98 F (36.7 C), temperature source Oral, resp. rate (!) 22, height _0  (1.651 m), weight 144 lb (65.3 kg). GENERAL:  Thin elderly woman sitting comfortably in the exam room in no acute distress.  She has a cane at her side. MENTAL STATUS:  Alert and oriented to person, place and time. HEAD:  Pearline Cables hair.  Normocephalic, atraumatic, face symmetric, no Cushingoid features. EYES:  Brown eyes.  Pupils equal round and reactive to light and accomodation.  No conjunctivitis or scleral icterus. ENT:  Oropharynx clear without lesion.  Tongue normal. Mucous membranes moist.  RESPIRATORY:  Clear to  auscultation without rales, wheezes or rhonchi. CARDIOVASCULAR:  Regular rate and rhythm without murmur, rub or gallop. BREAST:  Right breast with post-operative changes and significant fibrocystic changes in the upper outer quadrant.  No masses, skin changes or nipple discharge.  Left breast inferior medial fibrocystic changes.  No masses, skin changes or nipple discharge. ABDOMEN:  Soft, non-tender, with active bowel sounds, and no hepatosplenomegaly.  No masses. SKIN:  No rashes, ulcers or lesions. EXTREMITIES: No edema, no skin discoloration or tenderness.  No palpable cords. LYMPH NODES: No palpable cervical, supraclavicular, axillary or inguinal  adenopathy  NEUROLOGICAL: Unremarkable. PSYCH:  Appropriate.    No visits with results within 3 Day(s) from this visit.  Latest known visit with results is:  Appointment on 11/27/2017  Component Date Value Ref Range Status  . CA 27.29 11/27/2017 32.1  0.0 - 38.6 U/mL Final   Comment: (NOTE) Siemens Centaur Immunochemiluminometric Methodology [facility]) Values obtained with different assay methods or kits cannot be used interchangeably. Results cannot be interpreted as absolute evidence of the presence or absence of malignant disease. Performed At: [facility] [place], [place] [id number] Rush Farmer MD [id number] Performed at [facility], [address] [place], [place] [address]     Assessment:  Rose Irwin is a 84 y.o. female with a history of stage IB right breast cancer (2013) and thyroid cancer (1999).  She presented with neck fullness.  She underwent thyrodectomy and I-131.  She has a substernal goiter.  Chest CT on 08/01/2011 revealed a 3.8 x 3.9 x 5.7 cm mass in the superior mediastinum lying posterior to the trachea and displacing the esophagus toward the right along its upper portion.  More distally the thoracic esophagus appears normal. Decision was made for observation  She was  diagnosed with right breast cancer in 10/2011.  Pathology revealed a T1bN0M0 tumor which was ER positive, PR posiitive and her2/neu negative.  She underwent lumpectomy followed by radiation.  She began Arimidex after radiation.  She discontinued Arimidex in 2016.  She declined further hormonal therapy.    Noncontrast chest CT on 10/21/2017 revealed a low-attenuation mass posterior to the trachea that likely arises from the left lobe of the thyroid measuring 3.3 x 4.6 cm, with an internal thin calcified septation. The esophagus was displaced to the right.  Findings were stable as compared to prior exams.  Mediastinal lymph nodes were not enlarged by CT criteria.  Hilar regions were difficult to evaluate in the absence of IV contrast.  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  Enlarged pulmonary arteries were consistent with pulmonary arterial hypertension.  Mammogram on 12/31/2017 revealed no evidence of malignancy in either breast.  There were stable right breast posttreatment changes.  CA27.29 has been followed:  27.6 on 06/13/2012, 35.6 on 05/04/2015, 33.4 on 11/02/2015, 30.2 on 03/14/2016, 28.1 on 11/21/2016, 33.4 on 04/17/2017, 43.4 on 10/16/2017, and 32.1 on 11/27/2017.  She has a history of esophageal dilatation x 2 (last 15 years ago).  Chest CT on 11/09/2015 revealed a 3.7 x 4.0 mediastinal mass posterior to the mid trachea which was stable in size from 08/01/2011.  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 It exerted significant mass effect on the upper esophagus.    Barium swallow on 11/21/2015 revealed deviation of the cervical esophagus to the right from previously identified paratracheal mass lesion.  There were no motility issues with her esophagus.   I-131 scan with Thyrogen on 12/03/2015 revealed a large focus of intense radiotracer uptake within the superior mediastinum  corresponding to the mediastinal mass identified on chest CT and compatible with residual functioning thyroid tissue (recurrent thyroid cancer or normal thyroid tissue).  She is not a surgical candidate.  Symptomatically, she has issues with her right knee and back.  She is scheduled for knee surgery on 01/22/2018.  Exam is stable.  She is unsure when she is going to relocate to [facility], [place].  Plan: 1.  Review interval labs (CA27.29).  CA27.29 was  normal on recheck. 2.  Review interval mammogram- no evidence of malignancy. 3.  RTC in 3 months for MD assessment and labs (CBC with diff, CMP, CA27.29, TSH, free T4).   Lequita Asal, MD  01/01/2018, 1:19 PM

## 2018-01-03 ENCOUNTER — Ambulatory Visit: Payer: Medicare Other | Admitting: Pharmacy Technician

## 2018-01-03 DIAGNOSIS — Z79899 Other long term (current) drug therapy: Secondary | ICD-10-CM

## 2018-01-03 NOTE — Progress Notes (Signed)
  Completed [facility] application and contract.  Patient agreed to all terms of the [facility] contract. Patient is in the Medicare Part D coverage gap.    Patient acknowledged that she understood that [facility] would only be able to provide medication assistance for medications obtained from [facility].  Also, that medication assistance will cease on July 23, 2018 when new calendar year begins.    [facility] is to provide medication assistance for Synthroid and Combivent Respimat.  Ventolin will be used to bridge patient while waiting on Combivent Respimat from [facility].    Assisted patient will applying for L.I.S. (Low Income Subsidy).  Patient stated that spouse passed away in 10/13/22 is struggling with coping with his loss.  Referred patient to [facility].  [facility]

## 2018-01-08 ENCOUNTER — Inpatient Hospital Stay: Admission: RE | Admit: 2018-01-08 | Payer: Medicare Other | Source: Ambulatory Visit

## 2018-01-13 ENCOUNTER — Encounter
Admission: RE | Admit: 2018-01-13 | Discharge: 2018-01-13 | Disposition: A | Discharge status: Home / Self Care | Payer: Medicare Other | Source: Ambulatory Visit | Attending: Orthopedic Surgery | Admitting: Orthopedic Surgery

## 2018-01-13 ENCOUNTER — Other Ambulatory Visit: Payer: Self-pay

## 2018-01-13 DIAGNOSIS — Z01812 Encounter for preprocedural laboratory examination: Secondary | ICD-10-CM | POA: Insufficient documentation

## 2018-01-13 DIAGNOSIS — Z853 Personal history of malignant neoplasm of breast: Secondary | ICD-10-CM | POA: Diagnosis not present

## 2018-01-13 DIAGNOSIS — Z923 Personal history of irradiation: Secondary | ICD-10-CM | POA: Diagnosis not present

## 2018-01-13 DIAGNOSIS — F329 Major depressive disorder, single episode, unspecified: Secondary | ICD-10-CM | POA: Insufficient documentation

## 2018-01-13 DIAGNOSIS — I10 Essential (primary) hypertension: Secondary | ICD-10-CM | POA: Insufficient documentation

## 2018-01-13 DIAGNOSIS — J449 Chronic obstructive pulmonary disease, unspecified: Secondary | ICD-10-CM | POA: Insufficient documentation

## 2018-01-13 DIAGNOSIS — Z85850 Personal history of malignant neoplasm of thyroid: Secondary | ICD-10-CM | POA: Diagnosis not present

## 2018-01-13 DIAGNOSIS — E890 Postprocedural hypothyroidism: Secondary | ICD-10-CM | POA: Insufficient documentation

## 2018-01-13 DIAGNOSIS — G629 Polyneuropathy, unspecified: Secondary | ICD-10-CM | POA: Diagnosis not present

## 2018-01-13 DIAGNOSIS — K219 Gastro-esophageal reflux disease without esophagitis: Secondary | ICD-10-CM | POA: Insufficient documentation

## 2018-01-13 DIAGNOSIS — F419 Anxiety disorder, unspecified: Secondary | ICD-10-CM | POA: Diagnosis not present

## 2018-01-13 HISTORY — DX: Family history of other specified conditions: Z84.89

## 2018-01-13 HISTORY — DX: Personal history of urinary calculi: Z87.442

## 2018-01-13 LAB — URINALYSIS, ROUTINE W REFLEX MICROSCOPIC
Bilirubin Urine: NEGATIVE
Glucose, UA: NEGATIVE mg/dL
Hgb urine dipstick: NEGATIVE
KETONES UR: NEGATIVE mg/dL
LEUKOCYTES UA: NEGATIVE
NITRITE: NEGATIVE
Protein, ur: NEGATIVE mg/dL
SPECIFIC GRAVITY, URINE: 1.011 (ref 1.005–1.030)
pH: 6 (ref 5.0–8.0)

## 2018-01-13 LAB — BASIC METABOLIC PANEL
ANION GAP: 8 (ref 5–15)
BUN: 13 mg/dL (ref 6–20)
CALCIUM: 9.9 mg/dL (ref 8.9–10.3)
CO2: 36 mmol/L — ABNORMAL HIGH (ref 22–32)
Chloride: 94 mmol/L — ABNORMAL LOW (ref 101–111)
Creatinine, Ser: 0.64 mg/dL (ref 0.44–1.00)
Glucose, Bld: 92 mg/dL (ref 65–99)
Potassium: 4.2 mmol/L (ref 3.5–5.1)
Sodium: 138 mmol/L (ref 135–145)

## 2018-01-13 LAB — CBC
HCT: 41.7 % (ref 35.0–47.0)
HEMOGLOBIN: 13.9 g/dL (ref 12.0–16.0)
MCH: 27.9 pg (ref 26.0–34.0)
MCHC: 33.3 g/dL (ref 32.0–36.0)
MCV: 83.9 fL (ref 80.0–100.0)
Platelets: 233 10*3/uL (ref 150–440)
RBC: 4.97 MIL/uL (ref 3.80–5.20)
RDW: 13.6 % (ref 11.5–14.5)
WBC: 6 10*3/uL (ref 3.6–11.0)

## 2018-01-13 LAB — SURGICAL PCR SCREEN
MRSA, PCR: NEGATIVE
Staphylococcus aureus: NEGATIVE

## 2018-01-13 LAB — TYPE AND SCREEN
ABO/RH(D): O NEG
ANTIBODY SCREEN: NEGATIVE

## 2018-01-13 LAB — PROTIME-INR
INR: 1.06
PROTHROMBIN TIME: 13.7 s (ref 11.4–15.2)

## 2018-01-13 LAB — APTT: APTT: 31 s (ref 24–36)

## 2018-01-13 NOTE — Patient Instructions (Signed)
  Your procedure is scheduled on: Wednesday January 22, 2018 Report to Same Day Surgery 2nd floor medical mall ([place] Entrance-take elevator on left to 2nd floor.  Check in with surgery information desk.) To find out your arrival time please call [phone] between 1PM - 3PM on Tuesday January 21, 2018  Remember: Instructions that are not followed completely may result in serious medical risk, up to and including death, or upon the discretion of your surgeon and anesthesiologist your surgery may need to be rescheduled.    _x___ 1. Do not eat food after midnight the night before your procedure. You may drink clear liquids up to 2 hours before you are scheduled to arrive at the hospital for your procedure.  Do not drink clear liquids within 2 hours of your scheduled arrival to the hospital.  Clear liquids include  --Water or Apple juice without pulp  --Clear carbohydrate beverage such as Gatorade  --Black Coffee or Clear Tea (No milk, no creamers, do not add anything to the coffee or tea Type 1 and type 2 diabetics should only drink water.   No gum chewing or hard candies.      __x__ 2. No Alcohol for 24 hours before or after surgery.   __x__3. No Smoking or e-cigarettes for 24 prior to surgery.  Do not use any chewable tobacco products for at least 6 hour prior to surgery   ____  4. Bring all medications with you on the day of surgery if instructed.    __x__ 5. Notify your doctor if there is any change in your medical condition     (cold, fever, infections).   __x__6. On the morning of surgery brush your teeth with toothpaste and water.  You may rinse your mouth with mouth wash if you wish.  Do not swallow any toothpaste or mouthwash.   Do not wear jewelry, make-up, hairpins, clips or nail polish.  Do not wear lotions, powders, deodorant, or perfumes.   Do not shave 48 hours prior to surgery.   Do not bring valuables to the hospital.    [facility] is not responsible for any  belongings or valuables.               Contacts, dentures or bridgework may not be worn into surgery.  Leave your suitcase in the car. After surgery it may be brought to your room.  For patients admitted to the hospital, discharge time is determined by your treatment team.  Please read over the following fact sheets that you were given:   [facility] Preparing for Surgery and or MRSA Information   _x___ Take anti-hypertensive listed below, cardiac, seizure, asthma, anti-reflux and psychiatric medicines. These include:  1. Metoprolol/Lopressor  2. Paroxetine/Paxil  3. Levothyroxine/Synthroid  4. Amlodipine/Norvasc  5. Alprazolam/Xanax if needed  Do NOT take Hydrochlorothiazide/Hydrodiuril on day of surgery.  _x___ Use CHG Soap or sage wipes as directed on instruction sheet   _x___ Use inhalers on the day of surgery and bring to hospital day of surgery.  _x___ Stop aspirin NOW until after surgery, unless directed differently by Cardiologist, Pulmonologist or PCP. Do not take blood thinners such as Coumadin, Plavix ,Eliquis, Effient, or Pradaxa, and Pletal.  _x___ Stop Anti-inflammatories such as Advil, Aleve, Ibuprofen, Motrin, Naproxen, Naprosyn, Goodies powders or aspirin products. OK to take Tylenol and Celebrex.   _x___ Stop supplements until after surgery.  But may continue Vitamin D, Vitamin B, and multivitamin.

## 2018-01-21 MED ORDER — CEFAZOLIN SODIUM-DEXTROSE 2-4 GM/100ML-% IV SOLN
2.0000 g | INTRAVENOUS | Status: AC
  Administered 2018-01-22: 2 g via INTRAVENOUS

## 2018-01-21 MED ORDER — TRANEXAMIC ACID 1000 MG/10ML IV SOLN
1000.0000 mg | INTRAVENOUS | Status: AC
  Administered 2018-01-22: 1000 mg via INTRAVENOUS
  Filled 2018-01-21: qty 10

## 2018-01-22 ENCOUNTER — Inpatient Hospital Stay: Payer: Medicare Other

## 2018-01-22 ENCOUNTER — Inpatient Hospital Stay
Admission: RE | Admit: 2018-01-22 | Discharge: 2018-01-25 | DRG: 470 | Disposition: A | Discharge status: Skilled Nursing Facility | Payer: Medicare Other | Source: Ambulatory Visit | Attending: Orthopedic Surgery | Admitting: Orthopedic Surgery

## 2018-01-22 ENCOUNTER — Inpatient Hospital Stay: Payer: Medicare Other | Admitting: Anesthesiology

## 2018-01-22 ENCOUNTER — Encounter
Admission: RE | Disposition: A | Discharge status: Skilled Nursing Facility | Payer: Self-pay | Source: Ambulatory Visit | Attending: Orthopedic Surgery

## 2018-01-22 ENCOUNTER — Other Ambulatory Visit: Payer: Self-pay

## 2018-01-22 DIAGNOSIS — Z803 Family history of malignant neoplasm of breast: Secondary | ICD-10-CM

## 2018-01-22 DIAGNOSIS — T50995A Adverse effect of other drugs, medicaments and biological substances, initial encounter: Secondary | ICD-10-CM | POA: Diagnosis not present

## 2018-01-22 DIAGNOSIS — E785 Hyperlipidemia, unspecified: Secondary | ICD-10-CM | POA: Diagnosis present

## 2018-01-22 DIAGNOSIS — E039 Hypothyroidism, unspecified: Secondary | ICD-10-CM | POA: Diagnosis present

## 2018-01-22 DIAGNOSIS — Z9981 Dependence on supplemental oxygen: Secondary | ICD-10-CM | POA: Diagnosis not present

## 2018-01-22 DIAGNOSIS — R4182 Altered mental status, unspecified: Secondary | ICD-10-CM | POA: Diagnosis not present

## 2018-01-22 DIAGNOSIS — Z923 Personal history of irradiation: Secondary | ICD-10-CM | POA: Diagnosis not present

## 2018-01-22 DIAGNOSIS — Z79899 Other long term (current) drug therapy: Secondary | ICD-10-CM

## 2018-01-22 DIAGNOSIS — K219 Gastro-esophageal reflux disease without esophagitis: Secondary | ICD-10-CM | POA: Diagnosis present

## 2018-01-22 DIAGNOSIS — R410 Disorientation, unspecified: Secondary | ICD-10-CM

## 2018-01-22 DIAGNOSIS — Z09 Encounter for follow-up examination after completed treatment for conditions other than malignant neoplasm: Secondary | ICD-10-CM

## 2018-01-22 DIAGNOSIS — Z85828 Personal history of other malignant neoplasm of skin: Secondary | ICD-10-CM

## 2018-01-22 DIAGNOSIS — M1711 Unilateral primary osteoarthritis, right knee: Secondary | ICD-10-CM | POA: Diagnosis present

## 2018-01-22 DIAGNOSIS — F419 Anxiety disorder, unspecified: Secondary | ICD-10-CM | POA: Diagnosis present

## 2018-01-22 DIAGNOSIS — J449 Chronic obstructive pulmonary disease, unspecified: Secondary | ICD-10-CM | POA: Diagnosis present

## 2018-01-22 DIAGNOSIS — Z7982 Long term (current) use of aspirin: Secondary | ICD-10-CM | POA: Diagnosis not present

## 2018-01-22 DIAGNOSIS — Z90710 Acquired absence of both cervix and uterus: Secondary | ICD-10-CM

## 2018-01-22 DIAGNOSIS — Z7951 Long term (current) use of inhaled steroids: Secondary | ICD-10-CM | POA: Diagnosis not present

## 2018-01-22 DIAGNOSIS — I10 Essential (primary) hypertension: Secondary | ICD-10-CM | POA: Diagnosis present

## 2018-01-22 DIAGNOSIS — Z853 Personal history of malignant neoplasm of breast: Secondary | ICD-10-CM

## 2018-01-22 DIAGNOSIS — Z96651 Presence of right artificial knee joint: Secondary | ICD-10-CM

## 2018-01-22 DIAGNOSIS — Z85850 Personal history of malignant neoplasm of thyroid: Secondary | ICD-10-CM

## 2018-01-22 DIAGNOSIS — F17210 Nicotine dependence, cigarettes, uncomplicated: Secondary | ICD-10-CM | POA: Diagnosis present

## 2018-01-22 DIAGNOSIS — Z79890 Hormone replacement therapy (postmenopausal): Secondary | ICD-10-CM | POA: Diagnosis not present

## 2018-01-22 HISTORY — PX: TOTAL KNEE ARTHROPLASTY: SHX125

## 2018-01-22 LAB — ABO/RH: ABO/RH(D): O NEG

## 2018-01-22 SURGERY — ARTHROPLASTY, KNEE, TOTAL
Anesthesia: Spinal | Site: Knee | Laterality: Right | Wound class: Clean

## 2018-01-22 MED ORDER — ALBUTEROL SULFATE (2.5 MG/3ML) 0.083% IN NEBU: 3.0000 mL | INHALATION_SOLUTION | RESPIRATORY_TRACT | Status: DC | PRN

## 2018-01-22 MED ORDER — IPRATROPIUM-ALBUTEROL 0.5-2.5 (3) MG/3ML IN SOLN
3.0000 mL | Freq: Four times a day (QID) | RESPIRATORY_TRACT | Status: DC | PRN
  Filled 2018-01-22: qty 3

## 2018-01-22 MED ORDER — MIDAZOLAM HCL 2 MG/2ML IJ SOLN
INTRAMUSCULAR | Status: AC
  Filled 2018-01-22: qty 2

## 2018-01-22 MED ORDER — LACTATED RINGERS IV SOLN: INTRAVENOUS | Status: DC

## 2018-01-22 MED ORDER — BUPIVACAINE LIPOSOME 1.3 % IJ SUSP
20.0000 mL | Freq: Once | INTRAMUSCULAR | Status: DC
  Filled 2018-01-22: qty 20

## 2018-01-22 MED ORDER — ROPIVACAINE HCL 5 MG/ML IJ SOLN
INTRAMUSCULAR | Status: AC
  Filled 2018-01-22: qty 30

## 2018-01-22 MED ORDER — ONDANSETRON HCL 4 MG PO TABS: 4.0000 mg | ORAL_TABLET | Freq: Four times a day (QID) | ORAL | Status: DC | PRN

## 2018-01-22 MED ORDER — GABAPENTIN 300 MG PO CAPS
300.0000 mg | ORAL_CAPSULE | Freq: Three times a day (TID) | ORAL | Status: DC
  Administered 2018-01-22 – 2018-01-25 (×5): 300 mg via ORAL
  Filled 2018-01-22 (×5): qty 1

## 2018-01-22 MED ORDER — KETOROLAC TROMETHAMINE 15 MG/ML IJ SOLN
7.5000 mg | Freq: Four times a day (QID) | INTRAMUSCULAR | Status: AC
  Administered 2018-01-22 – 2018-01-23 (×2): 7.5 mg via INTRAVENOUS
  Filled 2018-01-22 (×2): qty 1

## 2018-01-22 MED ORDER — LIDOCAINE HCL (PF) 1 % IJ SOLN
INTRAMUSCULAR | Status: DC | PRN
  Administered 2018-01-22: 1 mL via INTRADERMAL

## 2018-01-22 MED ORDER — ACETAMINOPHEN 500 MG PO TABS
500.0000 mg | ORAL_TABLET | Freq: Four times a day (QID) | ORAL | Status: AC
  Administered 2018-01-22 – 2018-01-23 (×3): 500 mg via ORAL
  Filled 2018-01-22 (×3): qty 1

## 2018-01-22 MED ORDER — POLYETHYLENE GLYCOL 3350 17 G PO PACK
17.0000 g | PACK | Freq: Every day | ORAL | Status: DC | PRN
  Administered 2018-01-24: 17 g via ORAL
  Filled 2018-01-22: qty 1

## 2018-01-22 MED ORDER — SODIUM CHLORIDE 0.9 % IV SOLN
INTRAVENOUS | Status: DC | PRN
  Administered 2018-01-22: 60 mL

## 2018-01-22 MED ORDER — FENTANYL CITRATE (PF) 100 MCG/2ML IJ SOLN: 50.0000 ug | Freq: Once | INTRAMUSCULAR | Status: DC

## 2018-01-22 MED ORDER — CEFAZOLIN SODIUM-DEXTROSE 1-4 GM/50ML-% IV SOLN
1.0000 g | Freq: Four times a day (QID) | INTRAVENOUS | Status: AC
  Administered 2018-01-22 – 2018-01-23 (×2): 1 g via INTRAVENOUS
  Filled 2018-01-22 (×2): qty 50

## 2018-01-22 MED ORDER — GABAPENTIN 300 MG PO CAPS
300.0000 mg | ORAL_CAPSULE | Freq: Once | ORAL | Status: AC
  Administered 2018-01-22: 300 mg via ORAL

## 2018-01-22 MED ORDER — ACETAMINOPHEN 500 MG PO TABS
ORAL_TABLET | ORAL | Status: AC
  Administered 2018-01-22: 1000 mg via ORAL
  Filled 2018-01-22: qty 2

## 2018-01-22 MED ORDER — TRAMADOL HCL 50 MG PO TABS
50.0000 mg | ORAL_TABLET | Freq: Four times a day (QID) | ORAL | Status: DC
  Administered 2018-01-22 – 2018-01-24 (×5): 50 mg via ORAL
  Filled 2018-01-22 (×5): qty 1

## 2018-01-22 MED ORDER — HYDROCODONE-ACETAMINOPHEN 5-325 MG PO TABS
1.0000 | ORAL_TABLET | ORAL | Status: DC | PRN
  Administered 2018-01-22: 1 via ORAL
  Filled 2018-01-22 (×2): qty 1

## 2018-01-22 MED ORDER — PROPOFOL 500 MG/50ML IV EMUL
INTRAVENOUS | Status: DC | PRN
Start: 2018-01-22 — End: 2018-01-22
  Administered 2018-01-22: 75 ug/kg/min via INTRAVENOUS

## 2018-01-22 MED ORDER — BISACODYL 10 MG RE SUPP: 10.0000 mg | Freq: Every day | RECTAL | Status: DC | PRN

## 2018-01-22 MED ORDER — GABAPENTIN 300 MG PO CAPS
ORAL_CAPSULE | ORAL | Status: AC
  Administered 2018-01-22: 300 mg via ORAL
  Filled 2018-01-22: qty 1

## 2018-01-22 MED ORDER — PAROXETINE HCL 20 MG PO TABS
40.0000 mg | ORAL_TABLET | Freq: Every morning | ORAL | Status: DC
  Administered 2018-01-23 – 2018-01-25 (×3): 40 mg via ORAL
  Filled 2018-01-22 (×3): qty 2

## 2018-01-22 MED ORDER — PROPOFOL 10 MG/ML IV BOLUS
INTRAVENOUS | Status: DC | PRN
  Administered 2018-01-22: 40 mg via INTRAVENOUS

## 2018-01-22 MED ORDER — AMLODIPINE BESYLATE 5 MG PO TABS
2.5000 mg | ORAL_TABLET | Freq: Every day | ORAL | Status: DC
  Administered 2018-01-24 – 2018-01-25 (×2): 2.5 mg via ORAL
  Filled 2018-01-22 (×2): qty 1

## 2018-01-22 MED ORDER — METOPROLOL TARTRATE 50 MG PO TABS
50.0000 mg | ORAL_TABLET | Freq: Every day | ORAL | Status: DC
  Administered 2018-01-24 – 2018-01-25 (×2): 50 mg via ORAL
  Filled 2018-01-22 (×2): qty 1

## 2018-01-22 MED ORDER — LEVOTHYROXINE SODIUM 88 MCG PO TABS
88.0000 ug | ORAL_TABLET | Freq: Every day | ORAL | Status: DC
  Administered 2018-01-23 – 2018-01-25 (×3): 88 ug via ORAL
  Filled 2018-01-22 (×3): qty 1

## 2018-01-22 MED ORDER — FENTANYL CITRATE (PF) 100 MCG/2ML IJ SOLN: 25.0000 ug | INTRAMUSCULAR | Status: DC | PRN

## 2018-01-22 MED ORDER — FENTANYL CITRATE (PF) 100 MCG/2ML IJ SOLN
INTRAMUSCULAR | Status: AC
  Administered 2018-01-22: 50 ug
  Filled 2018-01-22: qty 2

## 2018-01-22 MED ORDER — ACETAMINOPHEN 325 MG PO TABS
325.0000 mg | ORAL_TABLET | Freq: Four times a day (QID) | ORAL | Status: DC | PRN
  Administered 2018-01-23 – 2018-01-25 (×4): 650 mg via ORAL
  Filled 2018-01-22 (×5): qty 2

## 2018-01-22 MED ORDER — CEFAZOLIN SODIUM-DEXTROSE 2-4 GM/100ML-% IV SOLN
INTRAVENOUS | Status: AC
  Filled 2018-01-22: qty 100

## 2018-01-22 MED ORDER — ASPIRIN 81 MG PO CHEW
81.0000 mg | CHEWABLE_TABLET | Freq: Two times a day (BID) | ORAL | Status: DC
  Administered 2018-01-23 – 2018-01-25 (×5): 81 mg via ORAL
  Filled 2018-01-22 (×5): qty 1

## 2018-01-22 MED ORDER — LACTATED RINGERS IV SOLN
INTRAVENOUS | Status: DC
  Administered 2018-01-22: 14:00:00 via INTRAVENOUS

## 2018-01-22 MED ORDER — PROPOFOL 500 MG/50ML IV EMUL
INTRAVENOUS | Status: AC
  Filled 2018-01-22: qty 50

## 2018-01-22 MED ORDER — PHENOL 1.4 % MT LIQD
1.0000 | OROMUCOSAL | Status: DC | PRN
  Filled 2018-01-22: qty 177

## 2018-01-22 MED ORDER — CHLORHEXIDINE GLUCONATE 4 % EX LIQD: 60.0000 mL | Freq: Once | CUTANEOUS | Status: DC

## 2018-01-22 MED ORDER — HYDROCHLOROTHIAZIDE 25 MG PO TABS
12.5000 mg | ORAL_TABLET | Freq: Every day | ORAL | Status: DC
  Administered 2018-01-23 – 2018-01-25 (×3): 12.5 mg via ORAL
  Filled 2018-01-22 (×2): qty 1

## 2018-01-22 MED ORDER — ONDANSETRON HCL 4 MG/2ML IJ SOLN
4.0000 mg | Freq: Four times a day (QID) | INTRAMUSCULAR | Status: DC | PRN
  Administered 2018-01-25: 4 mg via INTRAVENOUS
  Filled 2018-01-22: qty 2

## 2018-01-22 MED ORDER — LIDOCAINE HCL (PF) 1 % IJ SOLN
INTRAMUSCULAR | Status: AC
  Filled 2018-01-22: qty 5

## 2018-01-22 MED ORDER — HYDROCODONE-ACETAMINOPHEN 7.5-325 MG PO TABS: 1.0000 | ORAL_TABLET | ORAL | Status: DC | PRN

## 2018-01-22 MED ORDER — METOCLOPRAMIDE HCL 5 MG/ML IJ SOLN: 5.0000 mg | Freq: Three times a day (TID) | INTRAMUSCULAR | Status: DC | PRN

## 2018-01-22 MED ORDER — FAMOTIDINE 20 MG PO TABS
20.0000 mg | ORAL_TABLET | Freq: Once | ORAL | Status: AC
  Administered 2018-01-22: 20 mg via ORAL

## 2018-01-22 MED ORDER — BUPIVACAINE-EPINEPHRINE (PF) 0.5% -1:200000 IJ SOLN
INTRAMUSCULAR | Status: DC | PRN
  Administered 2018-01-22: 30 mL

## 2018-01-22 MED ORDER — SODIUM CHLORIDE 0.9 % IR SOLN
Status: DC | PRN
  Administered 2018-01-22: 15:00:00

## 2018-01-22 MED ORDER — METOCLOPRAMIDE HCL 10 MG PO TABS: 5.0000 mg | ORAL_TABLET | Freq: Three times a day (TID) | ORAL | Status: DC | PRN

## 2018-01-22 MED ORDER — SODIUM CHLORIDE 0.9 % IV SOLN
INTRAVENOUS | Status: DC | PRN
  Administered 2018-01-22: 25 ug/min via INTRAVENOUS

## 2018-01-22 MED ORDER — IPRATROPIUM-ALBUTEROL 0.5-2.5 (3) MG/3ML IN SOLN
3.0000 mL | Freq: Four times a day (QID) | RESPIRATORY_TRACT | Status: DC
  Administered 2018-01-22 – 2018-01-25 (×10): 3 mL via RESPIRATORY_TRACT
  Filled 2018-01-22 (×10): qty 3

## 2018-01-22 MED ORDER — MORPHINE SULFATE (PF) 2 MG/ML IV SOLN: 0.5000 mg | INTRAVENOUS | Status: DC | PRN

## 2018-01-22 MED ORDER — MIDAZOLAM HCL 2 MG/2ML IJ SOLN: 1.0000 mg | Freq: Once | INTRAMUSCULAR | Status: DC

## 2018-01-22 MED ORDER — MENTHOL 3 MG MT LOZG
1.0000 | LOZENGE | OROMUCOSAL | Status: DC | PRN
  Filled 2018-01-22: qty 9

## 2018-01-22 MED ORDER — FAMOTIDINE 20 MG PO TABS
ORAL_TABLET | ORAL | Status: AC
  Administered 2018-01-22: 20 mg via ORAL
  Filled 2018-01-22: qty 1

## 2018-01-22 MED ORDER — MIDAZOLAM HCL 2 MG/2ML IJ SOLN
INTRAMUSCULAR | Status: AC
  Administered 2018-01-22: 1 mg
  Filled 2018-01-22: qty 2

## 2018-01-22 MED ORDER — ROPIVACAINE HCL 5 MG/ML IJ SOLN
INTRAMUSCULAR | Status: DC | PRN
  Administered 2018-01-22: 10 mL via PERINEURAL
  Administered 2018-01-22: 20 mL via PERINEURAL

## 2018-01-22 MED ORDER — DOCUSATE SODIUM 100 MG PO CAPS
100.0000 mg | ORAL_CAPSULE | Freq: Two times a day (BID) | ORAL | Status: DC
  Administered 2018-01-22 – 2018-01-25 (×6): 100 mg via ORAL
  Filled 2018-01-22 (×6): qty 1

## 2018-01-22 MED ORDER — ACETAMINOPHEN 500 MG PO TABS
1000.0000 mg | ORAL_TABLET | Freq: Once | ORAL | Status: AC
  Administered 2018-01-22: 1000 mg via ORAL

## 2018-01-22 MED ORDER — BUPIVACAINE HCL (PF) 0.5 % IJ SOLN
INTRAMUSCULAR | Status: DC | PRN
  Administered 2018-01-22: 3 mL

## 2018-01-22 MED ORDER — MAGNESIUM CITRATE PO SOLN
1.0000 | Freq: Once | ORAL | Status: DC | PRN
  Filled 2018-01-22: qty 296

## 2018-01-22 SURGICAL SUPPLY — 56 items
BASEPLATE TIBIAL RT SZ2 (Knees) ×3 IMPLANT
BLADE SAW 18WX90L 1.27 THK (BLADE) ×3 IMPLANT
BLADE SAW SAG 25X90X1.19 (BLADE) ×3 IMPLANT
BOWL CEMENT MIX W/ADAPTER (MISCELLANEOUS) ×3 IMPLANT
BRUSH SCRUB EZ  4% CHG (MISCELLANEOUS) ×4
BRUSH SCRUB EZ 4% CHG (MISCELLANEOUS) ×2 IMPLANT
CANISTER SUCT 1200ML W/VALVE (MISCELLANEOUS) ×3 IMPLANT
CANISTER SUCT 3000ML PPV (MISCELLANEOUS) ×6 IMPLANT
CEMENT BONE 1-PACK (Cement) ×6 IMPLANT
CHLORAPREP W/TINT 26ML (MISCELLANEOUS) ×6 IMPLANT
COMP PATELLA GENESIS 29 OVAL (Orthopedic Implant) ×3 IMPLANT
COMPONENT FEM OXINIUM RT SZ4 (Knees) ×3 IMPLANT
COMPONENT PTLLA GENS 29 OVAL (Orthopedic Implant) ×1 IMPLANT
COOLER POLAR GLACIER W/PUMP (MISCELLANEOUS) ×3 IMPLANT
CUFF TOURN 30 STER DUAL PORT (MISCELLANEOUS) ×3 IMPLANT
DRAPE INCISE IOBAN 66X60 STRL (DRAPES) ×3 IMPLANT
DRAPE SHEET LG 3/4 BI-LAMINATE (DRAPES) ×3 IMPLANT
DRSG AQUACEL AG ADV 3.5X14 (GAUZE/BANDAGES/DRESSINGS) ×3 IMPLANT
ELECT REM PT RETURN 9FT ADLT (ELECTROSURGICAL) ×3
ELECTRODE REM PT RTRN 9FT ADLT (ELECTROSURGICAL) ×1 IMPLANT
GAUZE PETRO XEROFOAM 1X8 (MISCELLANEOUS) ×3 IMPLANT
GLOVE INDICATOR 8.0 STRL GRN (GLOVE) ×3 IMPLANT
GLOVE SURG ORTHO 8.0 STRL STRW (GLOVE) ×6 IMPLANT
GOWN STRL REUS W/ TWL LRG LVL3 (GOWN DISPOSABLE) ×2 IMPLANT
GOWN STRL REUS W/ TWL XL LVL3 (GOWN DISPOSABLE) ×1 IMPLANT
GOWN STRL REUS W/TWL LRG LVL3 (GOWN DISPOSABLE) ×4
GOWN STRL REUS W/TWL XL LVL3 (GOWN DISPOSABLE) ×2
HOOD PEEL AWAY FLYTE STAYCOOL (MISCELLANEOUS) ×9 IMPLANT
INSERT ARTI HI FLEX 9 SZ1-2 (Insert) ×3 IMPLANT
IV NS 1000ML (IV SOLUTION) ×2
IV NS 1000ML BAXH (IV SOLUTION) ×1 IMPLANT
KIT ADPT GUIDE LGNP RT F4 (KITS) ×3 IMPLANT
KIT TURNOVER KIT A (KITS) ×3 IMPLANT
MAT ABSORB  FLUID 56X50 GRAY (MISCELLANEOUS) ×2
MAT ABSORB FLUID 56X50 GRAY (MISCELLANEOUS) ×1 IMPLANT
NDL SAFETY ECLIPSE 18X1.5 (NEEDLE) ×1 IMPLANT
NEEDLE HYPO 18GX1.5 SHARP (NEEDLE) ×2
NEEDLE SPNL 20GX3.5 QUINCKE YW (NEEDLE) ×3 IMPLANT
NS IRRIG 1000ML POUR BTL (IV SOLUTION) ×3 IMPLANT
NS VIS ACPT GUIDE LGNP FEM RT SZ F4 ×3 IMPLANT
PACK TOTAL KNEE (MISCELLANEOUS) ×3 IMPLANT
PAD DE MAYO PRESSURE PROTECT (MISCELLANEOUS) ×3 IMPLANT
PAD WRAPON POLAR KNEE (MISCELLANEOUS) ×1 IMPLANT
PULSAVAC PLUS IRRIG FAN TIP (DISPOSABLE) ×3
STAPLER SKIN PROX 35W (STAPLE) ×3 IMPLANT
SUCTION FRAZIER HANDLE 10FR (MISCELLANEOUS) ×2
SUCTION TUBE FRAZIER 10FR DISP (MISCELLANEOUS) ×1 IMPLANT
SUT DVC 2 QUILL PDO  T11 36X36 (SUTURE) ×2
SUT DVC 2 QUILL PDO T11 36X36 (SUTURE) ×1 IMPLANT
SUT VIC AB 2-0 CT1 18 (SUTURE) ×3 IMPLANT
SUT VIC AB 2-0 CT1 27 (SUTURE) ×2
SUT VIC AB 2-0 CT1 TAPERPNT 27 (SUTURE) ×1 IMPLANT
SUT VIC AB PLUS 45CM 1-MO-4 (SUTURE) ×3 IMPLANT
SYR 30ML LL (SYRINGE) ×6 IMPLANT
TIP FAN IRRIG PULSAVAC PLUS (DISPOSABLE) ×1 IMPLANT
WRAPON POLAR PAD KNEE (MISCELLANEOUS) ×3

## 2018-01-22 NOTE — H&P (Signed)
The patient has been re-examined, and the chart reviewed, and there have been no interval changes to the documented history and physical.  Plan a right total knee today.  Anesthesia is consulted regarding a peripheral nerve block for post-operative pain.  The risks, benefits, and alternatives have been discussed at length, and the patient is willing to proceed.     

## 2018-01-22 NOTE — Anesthesia Procedure Notes (Signed)
Anesthesia Regional Block: Adductor canal block   Pre-Anesthetic Checklist: ,, timeout performed, Correct Patient, Correct Site, Correct Laterality, Correct Procedure, Correct Position, site marked, Risks and benefits discussed,  Surgical consent,  Pre-op evaluation,  At surgeon's request and post-op pain management  Laterality: Lower and Right  Prep: chloraprep       Needles:  Injection technique: Single-shot  Needle Type: Echogenic Needle     Needle Length: 9cm  Needle Gauge: 21     Additional Needles:   Procedures:,,,, ultrasound used (permanent image in chart),,,,  Narrative:  Start time: 01/22/2018 12:14 PM End time: 01/22/2018 12:16 PM Injection made incrementally with aspirations every 5 mL.  Performed by: Personally  Anesthesiologist: Remer Couse, Precious Haws, MD  Additional Notes: Functioning IV was confirmed and monitors were applied.  A echogenic needle was used. Sterile prep,hand hygiene and sterile gloves were used. Minimal sedation used for procedure.   No paresthesia endorsed by patient during the procedure.  Negative aspiration and negative test dose prior to incremental administration of local anesthetic. The patient tolerated the procedure well with no immediate complications.

## 2018-01-22 NOTE — Op Note (Signed)
DATE OF SURGERY:  01/22/2018 TIME: 4:06 PM  PATIENT NAME:  Rose Irwin   AGE: 84 y.o.    PRE-OPERATIVE DIAGNOSIS:  OSTEOARTHRITIS OF RIGHT KNEE JOINT  POST-OPERATIVE DIAGNOSIS:  Same  PROCEDURE:  Procedure(s): TOTAL KNEE ARTHROPLASTY  SURGEON:  Lovell Sheehan, MD   ASSISTANT:  Carlynn Spry, PA-C  OPERATIVE IMPLANTS: Size 4 right femoral component, Size 2 right tibial tray, 9 mm insert and 29 mm patella  PREOPERATIVE INDICATIONS:  Rose Irwin is an 84 y.o. female who has a diagnosis of right knee osteoarthritis and valgus deformity and elected for a right total knee arthroplasty after failing nonoperative treatment, including activity modification, pain medication, physical therapy and injections who has significant impairment of their activities of daily living.  Radiographs have demonstrated tricompartmental osteoarthritis joint space narrowing, osteophytes, subchondral sclerosis and cyst formation.  The risks, benefits, and alternatives were discussed at length including but not limited to the risks of infection, bleeding, nerve or blood vessel injury, knee stiffness, fracture, dislocation, loosening or failure of the hardware and the need for further surgery. Medical risks include but not limited to DVT and pulmonary embolism, myocardial infarction, stroke, pneumonia, respiratory failure and death. I discussed these risks with the patient in my office prior to the date of surgery. They understood these risks and were willing to proceed.  OPERATIVE FINDINGS AND UNIQUE ASPECTS OF THE CASE:  All three compartments with advanced and severe degenerative changes, large osteophytes and an abundance of synovial fluid. Significant deformity was also noted. A decision was made to proceed with total knee arthroplasty.   OPERATIVE DESCRIPTION:  The patient was brought to the operative room and placed in a supine position after undergoing placement of a general anesthetic. IV antibiotics  were given. Patient received tranexamic acid. The lower extremity was prepped and draped in the usual sterile fashion.  A time out was performed to verify the patient's name, date of birth, medical record number, correct site of surgery and correct procedure to be performed. The timeout was also used to confirm the patient received antibiotics and that appropriate instruments, implants and radiographs studies were available in the room.  The leg was elevated and exsanguinated with an Esmarch and the tourniquet was inflated to 250 mmHg.  A midline incision was made over the left knee.. A medial parapatellar arthrotomy was then made and the patella subluxed laterally and the knee was brought into 90 of flexion. Hoffa's fat pad along with the anterior cruciate ligament was resected and the medial joint line was exposed.  Attention was then turned to preparation of the patella. The thickness of the patella was measured with a caliper, the diameter measured with the patella templates.  The patella resection was then made with an oscillating saw using the patella cutting guide.  The 29 mm button fit appropriately.  3 peg holes for the patella component were then drilled.  The extramedullary tibial cutting guide was then placed using the anterior tibial crest and second ray of the foot as a reference.  The tibial cutting guide was adjusted to allow for appropriate posterior slope.  The tibial cutting block was pinned into position. The slotted stylus was used to measure the proximal tibial resection of 2 mm off the low medial side. Care was taken during the tibial resection to protect the medial and collateral ligaments.  The resected tibial bone was removed.  The distal femur was resected using the TruMatch cutting guide.  Care was taken to  protect the collateral ligaments during distal femoral resection.  The distal femoral resection was performed with an oscillating saw. The femoral cutting guide was then  removed. Extension gap was measured with a 9 mm spacer block and alignment and extension was confirmed using a long alignment rod. The femur was sized to be a 4. Rotation of the referencing guide was checked with the epicondylar axis and Whitesides line. Then the 4-in-1 cutting jig was then applied to the distal femur. A stylus was used to confirm that the anterior femur would not be notched.   Then the anterior, posterior and chamfer femoral cuts were then made with an oscillating saw.  All posterior osteophytes were removed.  The flexion gap was then measured with a flexion spacer block and long alignment rod and was found to be symmetric with the extension gap and perpendicular to mechanical axis of the tibia.  The proximal tibia plateau was then sized with trial trays. The best coverage was achieved with a size 2. This tibial tray was then pinned into position. The proximal tibia was then prepared with the reamer and keel punch.  After tibial preparation was completed, all trial components were inserted with polyethylene trials.  The knee was found to have excellent balance and full motion with a size 9 mm tibial polyethylene insert..    The trials were then placed. Knee was taken through a full range of motion and deemed to be stable with the trial components. All trial components were then removed.  The joint was copiously irrigated with pulse lavage.  The final total knee arthroplasty components were then cemented into place. The knee was held in extension while cement was allowed to cure.The knee was taken through a range of motion and the patella tracked well and the knee was again irrigated copiously.  The knee capsule was then injected with Exparel.  The medial arthrotomy was closed with #1 Vicryl and #2 Quill. The subcutaneous tissue closed with  2-0 vicryl, and skin approximated with staples.  A dry sterile and compressive dressing was applied.  A Polar Care was applied to the operative  knee.  The patient was awakened and brought to the PACU in stable and satisfactory condition.  All sharp, lap and instrument counts were correct at the conclusion the case. I spoke with the patient's family in the postop consultation room to let them know the case had been performed without complication and the patient was stable in recovery room.   Total tourniquet time was 50 minutes.

## 2018-01-22 NOTE — Anesthesia Preprocedure Evaluation (Addendum)
Anesthesia Evaluation  Patient identified by MRN, date of birth, ID band Patient awake    Reviewed: Allergy & Precautions, H&P , NPO status , Patient's Chart, lab work & pertinent test results  History of Anesthesia Complications (+) Family history of anesthesia reaction and history of anesthetic complications  Airway Mallampati: III  TM Distance: >3 FB Neck ROM: limited    Dental  (+) Chipped, Poor Dentition, Upper Dentures   Pulmonary shortness of breath and with exertion, COPD,  COPD inhaler and oxygen dependent, Current Smoker,           Cardiovascular hypertension, (-) angina(-) Past MI and (-) DOE      Neuro/Psych PSYCHIATRIC DISORDERS Anxiety Depression negative neurological ROS     GI/Hepatic negative GI ROS, Neg liver ROS, GERD  Medicated and Controlled,  Endo/Other  Hypothyroidism   Renal/GU      Musculoskeletal  (+) Arthritis ,   Abdominal   Peds  Hematology negative hematology ROS (+)   Anesthesia Other Findings Past Medical History: No date: Anxiety No date: Arthritis 11/21/2011: Breast cancer ([place])     Comment:  right breast cancer - radiation No date: Bronchitis 1999: Cancer ([place])     Comment:   thyroid No date: Complication of anesthesia     Comment:  smothering No date: COPD (chronic obstructive pulmonary disease) (HCC) No date: Depression No date: Elevated lipids No date: Family history of adverse reaction to anesthesia     Comment:  sister PONV No date: GERD (gastroesophageal reflux disease) No date: Hemorrhoids No date: History of kidney stones No date: Hypertension No date: Hypothyroidism No date: Neuropathy     Comment:  rt leg No date: Osteopenia 2013: Personal history of radiation therapy     Comment:  right breast ca No date: Skin cancer  Past Surgical History: No date: ABDOMINAL HYSTERECTOMY No date: BACK SURGERY     Comment:  lumbar No date: bladder polyps 10/2011:  BREAST BIOPSY; Right     Comment:  invasive mammary carcinoma yrs ago: BREAST BIOPSY; Right     Comment:  benign 1961, 1970, 1990: BREAST EXCISIONAL BIOPSY; Left     Comment:  benign 11/2011: BREAST LUMPECTOMY; Right     Comment:  invasive mammary carcinoma, clear margins, negative LN No date: BREAST SURGERY; Right 02/09/2015: CYSTOSCOPY W/ RETROGRADES; Bilateral     Comment:  Procedure: CYSTOSCOPY WITH RETROGRADE PYELOGRAM;                Surgeon: Hollice Espy, MD;  Location: [facility] ORS;                Service: Urology;  Laterality: Bilateral; No date: LUNG SURGERY No date: THYROID SURGERY  BMI    Body Mass Index:  24.19 kg/m      Reproductive/Obstetrics negative OB ROS                            Anesthesia Physical Anesthesia Plan  ASA: III  Anesthesia Plan: Spinal   Post-op Pain Management: GA combined w/ Regional for post-op pain   Induction:   PONV Risk Score and Plan:   Airway Management Planned: Natural Airway and Nasal Cannula  Additional Equipment:   Intra-op Plan:   Post-operative Plan:   Informed Consent: I have reviewed the patients History and Physical, chart, labs and discussed the procedure including the risks, benefits and alternatives for the proposed anesthesia with the patient or authorized representative who has indicated his/her  understanding and acceptance.   Dental Advisory Given  Plan Discussed with: Anesthesiologist, CRNA and Surgeon  Anesthesia Plan Comments: (Patient reports no bleeding problems and no anticoagulant use.  Plan for spinal with backup GA  Patient consented for risks of anesthesia including but not limited to:  - adverse reactions to medications - risk of bleeding, infection, nerve damage and headache - risk of failed spinal - damage to teeth, lips or other oral mucosa - sore throat or hoarseness - Damage to heart, brain, lungs or loss of life  Patient voiced understanding.)        Anesthesia Quick Evaluation

## 2018-01-22 NOTE — Transfer of Care (Signed)
Immediate Anesthesia Transfer of Care Note  Patient: Rose Irwin  Procedure(s) Performed: TOTAL KNEE ARTHROPLASTY (Right Knee)  Patient Location: PACU  Anesthesia Type:Spinal  Level of Consciousness: sedated  Airway & Oxygen Therapy: Patient Spontanous Breathing and Patient connected to face mask oxygen  Post-op Assessment: Report given to RN and Post -op Vital signs reviewed and stable  Post vital signs: Reviewed and stable  Last Vitals:  Vitals Value Taken Time  BP 98/40 01/22/2018  4:00 PM  Temp    Pulse 59 01/22/2018  4:00 PM  Resp 13 01/22/2018  4:00 PM  SpO2 99 % 01/22/2018  4:00 PM  Vitals shown include unvalidated device data.  Last Pain:  Vitals:   01/22/18 1313  TempSrc:   PainSc: 0-No pain         Complications: No apparent anesthesia complications

## 2018-01-22 NOTE — Anesthesia Procedure Notes (Signed)
Date/Time: 01/22/2018 2:59 PM Performed by: Nelda Marseille, CRNA Pre-anesthesia Checklist: Patient identified, Emergency Drugs available, Suction available, Patient being monitored and Timeout performed Oxygen Delivery Method: Simple face mask

## 2018-01-22 NOTE — Anesthesia Post-op Follow-up Note (Signed)
Anesthesia QCDR form completed.        

## 2018-01-22 NOTE — NC FL2 (Signed)
[place] LEVEL OF CARE SCREENING TOOL     IDENTIFICATION  Patient Name: Rose Irwin Birthdate: 03-15-1932 Sex: female Admission Date (Current Location): 01/22/2018  [place] and [place] Number:  Engineering geologist and Address:  [facility], [address], [place], [place] [address]      Provider Number: [id number]  Attending Physician Name and Address:  Lovell Sheehan, MD  Relative Name and Phone Number:       Current Level of Care: Hospital Recommended Level of Care: [place] Prior Approval Number:    Date Approved/Denied:   PASRR Number: [id number])  Discharge Plan: SNF    Current Diagnoses: Patient Active Problem List   Diagnosis Date Noted  . S/P TKR (total knee replacement) using cement, right 01/22/2018  . Goals of care, counseling/discussion 10/30/2017  . Acute respiratory failure with hypoxia ([place]) 11/01/2016  . Dysphagia 11/02/2015  . Skin cancer of nose 05/04/2015  . Esophageal stricture 05/04/2015  . Palpitations 01/18/2015  . Shortness of breath 01/17/2015  . COPD, mild ([place]) 01/03/2015  . Acquired hypothyroidism 10/28/2013  . Arthritis 10/28/2013  . Benign essential hypertension 10/28/2013  . GERD (gastroesophageal reflux disease) 10/28/2013  . History of hemorrhoids 10/28/2013  . History of nephrolithiasis 10/28/2013  . History of thyroid cancer 10/28/2013  . Keratoacanthoma 10/28/2013  . Major depression, chronic 10/28/2013  . Mixed hyperlipidemia 10/28/2013  . Osteopenia 10/28/2013  . Breast cancer ([place]) 10/22/2011  . Thyroid cancer ([place]) 07/23/1997    Orientation RESPIRATION BLADDER Height & Weight     Self, Time, Situation, Place  O2(2 Liters Oxygen. ) Continent Weight: 145 lb 6 oz (65.9 kg) Height:     BEHAVIORAL SYMPTOMS/MOOD NEUROLOGICAL BOWEL NUTRITION STATUS      Continent Diet(Diet: Regular )  AMBULATORY STATUS COMMUNICATION OF NEEDS Skin   Extensive Assist Verbally  Surgical wounds(Incision: Right Knee. )                       Personal Care Assistance Level of Assistance  Bathing, Feeding, Dressing Bathing Assistance: Limited assistance Feeding assistance: Independent Dressing Assistance: Limited assistance     Functional Limitations Info  Sight, Hearing, Speech Sight Info: Adequate Hearing Info: Adequate Speech Info: Adequate    SPECIAL CARE FACTORS FREQUENCY  PT (By licensed PT), OT (By licensed OT)     PT Frequency: (5) OT Frequency: (5)            Contractures      Additional Factors Info  Code Status, Allergies Code Status Info: (Full Code. ) Allergies Info: (Prednisone, Sulfa Antibiotics, Codeine, Cortisone, Aspirin)           Current Medications (01/22/2018):  This is the current hospital active medication list Current Facility-Administered Medications  Medication Dose Route Frequency Provider Last Rate Last Dose  . bupivacaine liposome (EXPAREL) 1.3 % injection 266 mg  20 mL Other Once Lovell Sheehan, MD      . chlorhexidine (HIBICLENS) 4 % liquid 4 application  60 mL Topical Once Lovell Sheehan, MD      . fentaNYL (SUBLIMAZE) injection 25-50 mcg  25-50 mcg Intravenous Q5 min PRN Piscitello, Precious Haws, MD      . fentaNYL (SUBLIMAZE) injection 50 mcg  50 mcg Intravenous Once Piscitello, Precious Haws, MD      . lactated ringers infusion   Intravenous Continuous Lovell Sheehan, MD      . midazolam (VERSED) injection 1 mg  1 mg Intravenous Once Piscitello, Precious Haws, MD         Discharge Medications: Please see discharge summary for a list of discharge medications.  Relevant Imaging Results:  Relevant Lab Results:   Additional Information (SSN: [id number])  [place] Lust, Veronia Beets, LCSW

## 2018-01-23 LAB — CBC
HCT: 31.8 % — ABNORMAL LOW (ref 35.0–47.0)
Hemoglobin: 10.8 g/dL — ABNORMAL LOW (ref 12.0–16.0)
MCH: 28.4 pg (ref 26.0–34.0)
MCHC: 33.8 g/dL (ref 32.0–36.0)
MCV: 83.9 fL (ref 80.0–100.0)
PLATELETS: 174 10*3/uL (ref 150–440)
RBC: 3.79 MIL/uL — AB (ref 3.80–5.20)
RDW: 13.5 % (ref 11.5–14.5)
WBC: 7.2 10*3/uL (ref 3.6–11.0)

## 2018-01-23 LAB — BASIC METABOLIC PANEL
Anion gap: 8 (ref 5–15)
BUN: 16 mg/dL (ref 8–23)
CALCIUM: 8.6 mg/dL — AB (ref 8.9–10.3)
CO2: 36 mmol/L — ABNORMAL HIGH (ref 22–32)
CREATININE: 0.65 mg/dL (ref 0.44–1.00)
Chloride: 95 mmol/L — ABNORMAL LOW (ref 98–111)
GFR calc Af Amer: >60 mL/min (ref >60)
GLUCOSE: 130 mg/dL — AB (ref 70–99)
POTASSIUM: 3.8 mmol/L (ref 3.5–5.1)
Sodium: 139 mmol/L (ref 135–145)

## 2018-01-23 NOTE — Progress Notes (Signed)
  Subjective:  Patient reports pain as moderate.    Objective:   VITALS:   Vitals:   01/22/18 2310 01/23/18 0415 01/23/18 0748 01/23/18 0903  BP: (!) 130/52 (!) 106/49  (!) 135/56  Pulse: 75 81  88  Resp: 16 17  16   Temp: 98.2 F (36.8 C) 98.4 F (36.9 C)  98.2 F (36.8 C)  TempSrc: Oral   Oral  SpO2: 95% 94% 98% 91%  Weight:      Height:        PHYSICAL EXAM:  Neurologically intact ABD soft Neurovascular intact Dorsiflexion/Plantar flexion intact Incision: dressing C/D/I Compartment soft  LABS  Results for orders placed or performed during the hospital encounter of 01/22/18 (from the past 24 hour(s))  ABO/Rh     Status: None   Collection Time: 01/22/18 10:35 AM  Result Value Ref Range   ABO/RH(D)      O NEG Performed at [facility], [address] [place], [place] [address]   CBC     Status: Abnormal   Collection Time: 01/23/18  4:46 AM  Result Value Ref Range   WBC 7.2 3.6 - 11.0 K/uL   RBC 3.79 (L) 3.80 - 5.20 MIL/uL   Hemoglobin 10.8 (L) 12.0 - 16.0 g/dL   HCT 31.8 (L) 35.0 - 47.0 %   MCV 83.9 80.0 - 100.0 fL   MCH 28.4 26.0 - 34.0 pg   MCHC 33.8 32.0 - 36.0 g/dL   RDW 13.5 11.5 - 14.5 %   Platelets 174 150 - 440 K/uL  Basic metabolic panel     Status: Abnormal   Collection Time: 01/23/18  4:46 AM  Result Value Ref Range   Sodium 139 135 - 145 mmol/L   Potassium 3.8 3.5 - 5.1 mmol/L   Chloride 95 (L) 98 - 111 mmol/L   CO2 36 (H) 22 - 32 mmol/L   Glucose, Bld 130 (H) 70 - 99 mg/dL   BUN 16 8 - 23 mg/dL   Creatinine, Ser 0.65 0.44 - 1.00 mg/dL   Calcium 8.6 (L) 8.9 - 10.3 mg/dL   GFR calc non Af Amer >60 >60 mL/min   GFR calc Af Amer >60 >60 mL/min   Anion gap 8 5 - 15    Dg Knee Right Port  Result Date: 01/22/2018 CLINICAL DATA:  Right total knee replacement. EXAM: PORTABLE RIGHT KNEE - 1-2 VIEW COMPARISON:  Right knee MRI dated December 30, 2017. FINDINGS: The right knee demonstrates a total knee arthroplasty without evidence of  hardware failure or complication. There is expected intra-articular air. There is no fracture or dislocation. The alignment is anatomic. Post-surgical changes noted in the surrounding soft tissues. IMPRESSION: Right total knee arthroplasty without evidence of acute postoperative complication. Electronically Signed   By: Titus Dubin M.D.   On: 01/22/2018 16:21    Assessment/Plan: 1 Day Post-Op   Active Problems:   S/P TKR (total knee replacement) using cement, right   Advance diet Up with therapy D/C IV fluids Plan for discharge tomorrow pending progress with PT today.   Lovell Sheehan , MD 01/23/2018, 9:51 AM

## 2018-01-23 NOTE — Anesthesia Postprocedure Evaluation (Signed)
Anesthesia Post Note  Patient: Rose Irwin  Procedure(s) Performed: TOTAL KNEE ARTHROPLASTY (Right Knee)  Patient location during evaluation: Nursing Unit Anesthesia Type: Spinal Level of consciousness: oriented and awake and alert Pain management: pain level controlled Vital Signs Assessment: post-procedure vital signs reviewed and stable Respiratory status: spontaneous breathing, respiratory function stable and patient connected to nasal cannula oxygen Cardiovascular status: blood pressure returned to baseline and stable Postop Assessment: no headache, no backache and no apparent nausea or vomiting Anesthetic complications: no     Last Vitals:  Vitals:   01/23/18 0415 01/23/18 0748  BP: (!) 106/49   Pulse: 81   Resp: 17   Temp: 36.9 C   SpO2: 94% 98%    Last Pain:  Vitals:   01/23/18 0601  TempSrc:   PainSc: 4                  Dehlia Kilner,  Clearnce Sorrel

## 2018-01-23 NOTE — Evaluation (Signed)
Physical Therapy Evaluation Patient Details Name: Rose Irwin MRN: [id number] DOB: [birth year] Today's Date: 01/23/2018   History of Present Illness  Patient is a pleasant 84 year old female who presents to hospital for TKA RLE with cement.  Patient's sons and daughter in law present for evaluation. PMH: anxiety, anthritis, breast cancer, bronchitis, thyroid cancer, COPD, depression, GERD, hemorrhoids, HTN, Hypothyroidism, Neuropathy (RLE), and Osteopenia.   Clinical Impression  Patient is a pleasant 84 y/o female s/p R TKA with cement. Pt. Has history of neuropathy limiting sensation testing however patient did report feeling PT touching toes. Pt. Hard of hearing and requires simple commands for follow through. Pt. Out of breath easily and dizzy upon standing. STS requires Min A for forward weight shift into standing position with patient limiting her weight acceptance onto RLE. Patient requires Mod A for SPT due to limited weight shift onto RLE at this time. Pt. ROM limited from bandage and limited strength with AAROM R knee flex 73 and extend -4. Patient and family educated on bed HEP for strength and ROM. Patient will benefit from skilled physical therapy to improve mobility, strength, and ROM for return to PLOF. Patient will require home health physical therapy and 24 hour supervision upon return home due to above mentioned deficits.     Follow Up Recommendations Home health PT;Supervision for mobility/OOB    Equipment Recommendations  Standard walker;3in1 (PT)    Recommendations for Other Services       Precautions / Restrictions Precautions Precaution Comments: R knee TKA, able to perform SLR  Restrictions Weight Bearing Restrictions: Yes RLE Weight Bearing: Weight bearing as tolerated Other Position/Activity Restrictions: WBAT with RLE,      Mobility  Bed Mobility Overal bed mobility: Modified Independent             General bed mobility comments: Patient rolls R and L  independently, Requires use of hand rails for supine to sit and sit to supine. Able to lift LE's into bed independently.   Transfers Overall transfer level: Needs assistance Equipment used: Standard walker Transfers: Sit to/from [employer] Transfers Sit to Stand: Min assist Stand pivot transfers: Mod assist       General transfer comment: Patient requires cueing for hand placement for STS with Min A for transfer itself. SPT challenging to patient with mod verbal cueing and Mod A for sequencing of steps.  Limited weight acceptance onto RLE.   Ambulation/Gait             General Gait Details: Patient refused ambulation due to dizziness/fatigue.   Stairs            Wheelchair Mobility    Modified Rankin (Stroke Patients Only)       Balance Overall balance assessment: Needs assistance Sitting-balance support: Single extremity supported;Feet supported Sitting balance-Leahy Scale: Fair Sitting balance - Comments: Posterior LOB when fatigued.  Postural control: Posterior lean Standing balance support: Bilateral upper extremity supported Standing balance-Leahy Scale: Poor Standing balance comment: Pt. requires BUE support for static and dynamic mobility with little weight shift over RLE.                              Pertinent Vitals/Pain Pain Assessment: 0-10 Pain Score: 3  Pain Location: R knee Pain Descriptors / Indicators: Aching Pain Intervention(s): Monitored during session    Home Living Family/patient expects to be discharged to:: Private residence Living Arrangements: Alone Available Help at Discharge: Family(son  coming to stay for 1 week) Type of Home: House Home Access: Stairs to enter Entrance Stairs-Rails: Right;Left;Can reach both Entrance Stairs-Number of Steps: 4 Home Layout: One level Home Equipment: Cane - single point Additional Comments: Patient lives alone since husband has passed away. Son and daughter in law visit  frequently and other son will come to stay with patient for about a week. Neighbor will do her shopping.     Prior Function Level of Independence: Independent         Comments: Patient reports her son and daughter in law come to visit her frequently. Uses cane for daily mobility and 2 L02 at night per patient report.      Hand Dominance        Extremity/Trunk Assessment   Upper Extremity Assessment Upper Extremity Assessment: Generalized weakness    Lower Extremity Assessment Lower Extremity Assessment: LLE deficits/detail;RLE deficits/detail RLE Deficits / Details: Hip flex: 3/5 abduction/adduct 3/5 knee flexion: 2+/5 ;  knee extension 3/5,  RLE Sensation: history of peripheral neuropathy RLE Coordination: decreased gross motor LLE Deficits / Details: Gross 4-/5 LLE Sensation: history of peripheral neuropathy       Communication   Communication: HOH  Cognition Arousal/Alertness: Awake/alert(sleepy but able to stay alert. ) Behavior During Therapy: WFL for tasks assessed/performed Overall Cognitive Status: Difficult to assess Area of Impairment: Following commands                       Following Commands: Follows one step commands consistently;Follows multi-step commands inconsistently       General Comments: Patient is hard of hearing and requires simple commands for follow through. Family appears non concerned.       General Comments General comments (skin integrity, edema, etc.): Bandage over R knee, Skin above and below bandage appear normal in coloration and temperature.     Exercises Total Joint Exercises Ankle Circles/Pumps: AROM;Both;10 reps;Supine Quad Sets: AROM;Strengthening;Right;10 reps;Supine Heel Slides: AAROM;Right;10 reps;Supine Straight Leg Raises: Strengthening;Supine(1x) Knee Flexion: AAROM;Right;10 reps;Supine Goniometric ROM: R knee flex AAROM 73 extension -4; bandage limiting mobility  Other Exercises Other Exercises: seated  marches 5x each leg, posterior LOB requiring assistance to maintain position    Assessment/Plan    PT Assessment Patient needs continued PT services  PT Problem List Decreased strength;Decreased range of motion;Decreased activity tolerance;Decreased balance;Decreased knowledge of use of DME;Decreased mobility;Decreased safety awareness;Cardiopulmonary status limiting activity;Impaired sensation;Pain       PT Treatment Interventions DME instruction;Gait training;Stair training;Functional mobility training;Neuromuscular re-education;Balance training;Therapeutic exercise;Therapeutic activities;Patient/family education;Manual techniques;Modalities    PT Goals (Current goals can be found in the Care Plan section)  Acute Rehab PT Goals Patient Stated Goal: return home and return to PLOF.  PT Goal Formulation: With patient Time For Goal Achievement: 02/06/18 Potential to Achieve Goals: Fair    Frequency BID   Barriers to discharge   Needs supervision/assistance for mobility     Co-evaluation               AM-PAC PT "6 Clicks" Daily Activity  Outcome Measure Difficulty turning over in bed (including adjusting bedclothes, sheets and blankets)?: A Little Difficulty moving from lying on back to sitting on the side of the bed? : A Little Difficulty sitting down on and standing up from a chair with arms (e.g., wheelchair, bedside commode, etc,.)?: Unable Help needed moving to and from a bed to chair (including a wheelchair)?: A Little Help needed walking in hospital room?: A Lot Help needed climbing 3-5  steps with a railing? : A Lot 6 Click Score: 14    End of Session Equipment Utilized During Treatment: Gait belt;Oxygen(2L 02 via nasal cannula) Activity Tolerance: Patient limited by fatigue Patient left: in bed;with bed alarm set;with call bell/phone within reach;with family/visitor present;with SCD's reapplied Nurse Communication: Mobility status;Other (comment)(need for external  cath to be re-inserted) PT Visit Diagnosis: Unsteadiness on feet (R26.81);Other abnormalities of gait and mobility (R26.89);Muscle weakness (generalized) (M62.81);Pain Pain - Right/Left: Right Pain - part of body: Knee    Time: 1368-5992 PT Time Calculation (min) (ACUTE ONLY): 42 min   Charges:   PT Evaluation $PT Eval Low Complexity: 1 Low PT Treatments $Therapeutic Exercise: 23-37 mins   PT G Codes:        Janna Arch, PT, DPT    Janna Arch 01/23/2018, 11:00 AM

## 2018-01-24 ENCOUNTER — Inpatient Hospital Stay: Payer: Medicare Other

## 2018-01-24 ENCOUNTER — Encounter
Admission: RE | Admit: 2018-01-24 | Discharge: 2018-01-24 | Disposition: A | Discharge status: Home / Self Care | Payer: Medicare Other | Source: Ambulatory Visit | Attending: Internal Medicine | Admitting: Internal Medicine

## 2018-01-24 LAB — CBC
HCT: 30.8 % — ABNORMAL LOW (ref 35.0–47.0)
Hemoglobin: 10.5 g/dL — ABNORMAL LOW (ref 12.0–16.0)
MCH: 28.5 pg (ref 26.0–34.0)
MCHC: 34 g/dL (ref 32.0–36.0)
MCV: 83.8 fL (ref 80.0–100.0)
Platelets: 173 10*3/uL (ref 150–440)
RBC: 3.68 MIL/uL — ABNORMAL LOW (ref 3.80–5.20)
RDW: 13 % (ref 11.5–14.5)
WBC: 9.4 10*3/uL (ref 3.6–11.0)

## 2018-01-24 LAB — URINALYSIS, COMPLETE (UACMP) WITH MICROSCOPIC
Bacteria, UA: NONE SEEN
Bilirubin Urine: NEGATIVE
Glucose, UA: NEGATIVE mg/dL
Hgb urine dipstick: NEGATIVE
Ketones, ur: NEGATIVE mg/dL
Leukocytes, UA: NEGATIVE
Nitrite: NEGATIVE
Protein, ur: NEGATIVE mg/dL
Specific Gravity, Urine: 1.008 (ref 1.005–1.030)
pH: 7 (ref 5.0–8.0)

## 2018-01-24 MED ORDER — ALPRAZOLAM 0.25 MG PO TABS
0.2500 mg | ORAL_TABLET | Freq: Every evening | ORAL | Status: DC | PRN
Start: 2018-01-24 — End: 2018-01-25
  Administered 2018-01-24: 0.25 mg via ORAL
  Filled 2018-01-24: qty 1

## 2018-01-24 NOTE — Progress Notes (Signed)
Physical Therapy Treatment Patient Details Name: Rose Irwin MRN: [id number] DOB: June 07, 1932 Today's Date: 01/24/2018    History of Present Illness Patient is a pleasant 84 year old female who presents to hospital for TKA RLE with cement.  Patient's sons and daughter in law present for evaluation. PMH: anxiety, anthritis, breast cancer, bronchitis, thyroid cancer, COPD, depression, GERD, hemorrhoids, HTN, Hypothyroidism, Neuropathy (RLE), and Osteopenia.     PT Comments    Pt states that she is not doing well this morning and has not slept for two days. Pt very agitated throughout session demonstrating impulsivity and poor safety awareness. Pt confused at this time stating that she was doing so well for the first week after surgery despite this being POD2. Pt not on oxygen at beginning of session, O2 saturation 91-92% HR 105. Pt instructed in supine there-ex which she tolerated well. Pt performed bed mobility with min assist to sitting EOB. In sitting pt complains of dizziness, O2 saturation decreased to 78% and HR elevated to 154. Pt min assist sitting to supine and O2 reapplied. Pts O2 saturation recovered to 95% on 3L via [place] and HR decreased to 107. Further mobility declined due to elevated HR with activity. PT notified nursing of mobility status and of abnormal vitals with activity. PT notified case manager of changes to d/c recommendation. Pt could benefit from continued skilled therapy at this time to improve deficits toward PLOF. PT will continue to work with pt BID while admitted. D/c recommendations at this time have changed to SNF.   Follow Up Recommendations  SNF     Equipment Recommendations  Standard walker;3in1 (PT)    Recommendations for Other Services       Precautions / Restrictions Precautions Precautions: Knee Precaution Comments: R knee TKA, able to perform SLR  Restrictions Weight Bearing Restrictions: Yes RLE Weight Bearing: Weight bearing as tolerated     Mobility  Bed Mobility Overal bed mobility: Needs Assistance Bed Mobility: Supine to Sit     Supine to sit: Min assist     General bed mobility comments: pt requires min assistance for R LE management to move supine to sit. Pts O2 saturation dropped to 78 with movement and HR elevated from 105 in supine to 154 in sitting.  Transfers                 General transfer comment: declined this visit. Pts HR elevated from 105 to 154 moving supine to sit so further transfers held.  Ambulation/Gait             General Gait Details: declined this visit. Pts HR elevated from 105 to 154 moving supine to sit so further transfers held.   Stairs             Wheelchair Mobility    Modified Rankin (Stroke Patients Only)       Balance Overall balance assessment: Needs assistance Sitting-balance support: Single extremity supported;Feet supported Sitting balance-Leahy Scale: Fair Sitting balance - Comments: Posterior LOB when fatigued.                                     Cognition Arousal/Alertness: Awake/alert Behavior During Therapy: Agitated Overall Cognitive Status: Difficult to assess Area of Impairment: Following commands;Memory;Attention                   Current Attention Level: Selective   Following Commands: Follows one step  commands consistently;Follows multi-step commands inconsistently       General Comments: Patient is hard of hearing and requires simple commands for follow through. Family appears non concerned.       Exercises Total Joint Exercises Goniometric ROM: Pts ext 3 degrees. Unable to assess flexion in sitting EOB due to elevated HR and decreased O2 saturation Other Exercises Other Exercises: pt instructed in supine ther-ex including SLR, quad sets, ankle pumps, glut sets, hip abd x 12 each    General Comments        Pertinent Vitals/Pain      Home Living                      Prior Function             PT Goals (current goals can now be found in the care plan section) Acute Rehab PT Goals Patient Stated Goal: return home and return to PLOF.  PT Goal Formulation: With patient Time For Goal Achievement: 02/06/18 Potential to Achieve Goals: Fair Progress towards PT goals: Progressing toward goals    Frequency    BID      PT Plan Discharge plan needs to be updated    Co-evaluation              AM-PAC PT "6 Clicks" Daily Activity  Outcome Measure  Difficulty turning over in bed (including adjusting bedclothes, sheets and blankets)?: A Little Difficulty moving from lying on back to sitting on the side of the bed? : Unable Difficulty sitting down on and standing up from a chair with arms (e.g., wheelchair, bedside commode, etc,.)?: Unable Help needed moving to and from a bed to chair (including a wheelchair)?: A Lot Help needed walking in hospital room?: A Lot Help needed climbing 3-5 steps with a railing? : Total 6 Click Score: 10    End of Session Equipment Utilized During Treatment: Gait belt;Oxygen Activity Tolerance: Patient limited by fatigue(limited due to abnormal vitals with activity) Patient left: in bed;with bed alarm set;with call bell/phone within reach;with family/visitor present;with SCD's reapplied Nurse Communication: Mobility status;Other (comment)(elevated HR, O2 desaturation with bed mobility) PT Visit Diagnosis: Unsteadiness on feet (R26.81);Other abnormalities of gait and mobility (R26.89);Muscle weakness (generalized) (M62.81);Pain Pain - Right/Left: Right Pain - part of body: Knee     Time: 0900-0930 PT Time Calculation (min) (ACUTE ONLY): 30 min  Charges:                       G Codes:       Tuwanna Krausz Marylu Lund, SPT    Macarius Ruark 01/24/2018, 9:51 AM

## 2018-01-24 NOTE — Progress Notes (Signed)
Pt is more confused today. Per pt son she is seeing cats and children in her room that are not there. VSS. Per PT the pt HR increases to 150's with movement. MD made aware. New order for hospitalist. Pt is voiding small amounts. RN restarted LR at 75/hr, MD aware. RN is holding sedating medications per MD. Will continue to monitor.

## 2018-01-24 NOTE — Clinical Social Work Placement (Signed)
   CLINICAL SOCIAL WORK PLACEMENT  NOTE  Date:  01/24/2018  Patient Details  Name: Rose Irwin MRN: [id number] Date of Birth: 08-28-1931  Clinical Social Work is seeking post-discharge placement for this patient at the [place] level of care (*CSW will initial, date and re-position this form in  chart as items are completed):  Yes   Patient/family provided with [facility] Work Department's list of facilities offering this level of care within the geographic area requested by the patient (or if unable, by the patient's family).  Yes   Patient/family informed of their freedom to choose among providers that offer the needed level of care, that participate in Medicare, Medicaid or managed care program needed by the patient, have an available bed and are willing to accept the patient.  Yes   Patient/family informed of [facility] ownership interest in [facility] and [facility], as well as of the fact that they are under no obligation to receive care at these facilities.  PASRR submitted to EDS on 01/22/18     PASRR number received on 01/22/18     Existing PASRR number confirmed on       FL2 transmitted to all facilities in geographic area requested by pt/family on 01/24/18     FL2 transmitted to all facilities within larger geographic area on       Patient informed that his/her managed care company has contracts with or will negotiate with certain facilities, including the following:        Yes   Patient/family informed of bed offers received.  Patient chooses bed at [facility] )     Physician recommends and patient chooses bed at      Patient to be transferred to   on  .  Patient to be transferred to facility by       Patient family notified on   of transfer.  Name of family member notified:        PHYSICIAN       Additional Comment:    _______________________________________________ Sidney Kann, Veronia Beets, LCSW 01/24/2018,  10:48 AM

## 2018-01-24 NOTE — Progress Notes (Signed)
PT Cancellation Note  Patient Details Name: Rose Irwin MRN: [id number] DOB: 22-Nov-1931   Cancelled Treatment:     PT attempted to see pt this afternoon. Upon arrival pts family discussing concerns with RN. RN Raquel Sarna states that pt is currently having visual hallucinations. RN request that PT hold off seeing pt while RN discusses pts earlier abnormal vitals as well as current visual hallucinations. PT will attempt at next date that pt is medically stable.  [employer], SPT   Jalen Oberry 01/24/2018, 2:10 PM

## 2018-01-24 NOTE — Clinical Social Work Note (Signed)
Clinical Social Work Assessment  Patient Details  Name: Rose Irwin MRN: [id number] Date of Birth: Apr 01, 1932  Date of referral:  01/24/18               Reason for consult:  Facility Placement                Permission sought to share information with:  Chartered certified accountant granted to share information::  Yes, Verbal Permission Granted  Name::      [place]::   [place]   Relationship::     Contact Information:     Housing/Transportation Living arrangements for the past 2 months:  [place] of Information:  Patient, Adult Children Patient Interpreter Needed:  None Criminal Activity/Legal Involvement Pertinent to Current Situation/Hospitalization:  No - Comment as needed Significant Relationships:  Adult Children Lives with:  Self Do you feel safe going back to the place where you live?  Yes Need for family participation in patient care:  Yes (Comment)  Care giving concerns:  Patient lives alone in [place].    Social Worker assessment / plan:  Holiday representative (CSW) received SNF consult. PT is recommended home health on post op day 1 and changed recommendation to SNF on post op day 2. CSW met with patient and her son/ HPO[employer] [place] [phone] was at bedside. Patient was alert and oriented X3 and was laying in the bed. CSW introduced self and explained role of CSW department. Patient reported that she lives alone in [place] and her only son Shea Evans lives in [place] and came up to stay with her for 1-2 weeks. CSW explained that PT is recommending SNF and that [facility] would have to approve it. Patient and son are agreeable to SNF search in [place]. FL2 complete and faxed out.   CSW presented bed offers to patient and son. They chose [facility] and are agreeable to a semi-private room. Per [facility] admissions coordinator at [facility] she will start [facility] SNF authorization today. CSW will continue to  follow and assist as needed.   Employment status:  Retired Nurse, adult PT Recommendations:  [place] / Referral to community resources:  [place]  Patient/Family's Response to care:  Patient is agreeable to D/C to [facility]   Patient/Family's Understanding of and Emotional Response to Diagnosis, Current Treatment, and Prognosis:  Patient was very pleasant and thanked CSW for assistance.   Emotional Assessment Appearance:  Appears stated age Attitude/Demeanor/Rapport:    Affect (typically observed):  Accepting, Adaptable, Pleasant Orientation:  Oriented to Self, Oriented to Place, Oriented to  Time, Oriented to Situation Alcohol / Substance use:  Not Applicable Psych involvement (Current and /or in the community):  No (Comment)  Discharge Needs  Concerns to be addressed:  Discharge Planning Concerns Readmission within the last 30 days:  No Current discharge risk:  Dependent with Mobility Barriers to Discharge:  Continued Medical Work up   [employer], Veronia Beets, LCSW 01/24/2018, 10:49 AM

## 2018-01-24 NOTE — Progress Notes (Addendum)
Per [facility] admissions coordinator at [facility] authorization has been received. Patient can D/C to [facility] room 225 over the weekend if medically stable. Patient and her son Shea Evans are aware of above.   [employer], LCSW [phone]

## 2018-01-24 NOTE — Consult Note (Signed)
[facility] NAME: Rose Irwin    MR#:  [id number]  DATE OF BIRTH:  27-May-1932  DATE OF ADMISSION:  01/22/2018  PRIMARY CARE PHYSICIAN: Juluis Pitch, MD   REQUESTING/REFERRING PHYSICIAN: Harlow Mares  CHIEF COMPLAINT:  confusion  HISTORY OF PRESENT ILLNESS: Rose Irwin  is a 84 y.o. female with a known history of anxiety, breast cancer, bronchitis, thyroid cancer, COPD, depression, hyperlipidemia, gastroesophageal reflux disease, hemorrhoids, hypertension, hypothyroidism, osteopenia-admitted under orthopedic service for scheduled knee replacement surgery. Status post surgery day 2, she remains significantly confused so medical consult was called in. As per her son who is present in the room, yesterday patient was more lethargic and her pain medications were discontinued today patient is alert but confused and not well oriented. Patient denies any complaints.  PAST MEDICAL HISTORY:   Past Medical History:  Diagnosis Date  . Anxiety   . Arthritis   . Breast cancer ([place]) 11/21/2011   right breast cancer - radiation  . Bronchitis   . Cancer ([place]) 1999    thyroid  . Complication of anesthesia    smothering  . COPD (chronic obstructive pulmonary disease) ([place])   . Depression   . Elevated lipids   . Family history of adverse reaction to anesthesia    sister PONV  . GERD (gastroesophageal reflux disease)   . Hemorrhoids   . History of kidney stones   . Hypertension   . Hypothyroidism   . Neuropathy    rt leg  . Osteopenia   . Personal history of radiation therapy 2013   right breast ca  . Skin cancer     PAST SURGICAL HISTORY:  Past Surgical History:  Procedure Laterality Date  . ABDOMINAL HYSTERECTOMY    . BACK SURGERY     lumbar  . bladder polyps    . BREAST BIOPSY Right 10/2011   invasive mammary carcinoma  . BREAST BIOPSY Right yrs ago   benign  . BREAST EXCISIONAL BIOPSY Left 1961, 1970, 1990   benign  . BREAST  LUMPECTOMY Right 11/2011   invasive mammary carcinoma, clear margins, negative LN  . BREAST SURGERY Right   . CYSTOSCOPY W/ RETROGRADES Bilateral 02/09/2015   Procedure: CYSTOSCOPY WITH RETROGRADE PYELOGRAM;  Surgeon: Hollice Espy, MD;  Location: [facility] ORS;  Service: Urology;  Laterality: Bilateral;  . LUNG SURGERY    . THYROID SURGERY      SOCIAL HISTORY:  Social History   Tobacco Use  . Smoking status: Current Every Day Smoker    Packs/day: 0.50    Years: 40.00    Pack years: 20.00    Types: Cigarettes  . Smokeless tobacco: Never Used  Substance Use Topics  . Alcohol use: No    Alcohol/week: 0.0 oz    FAMILY HISTORY:  Family History  Problem Relation Age of Onset  . Breast cancer Mother        >50  . Breast cancer Sister        >50  . Breast cancer Sister        >50    DRUG ALLERGIES:  Allergies  Allergen Reactions  . Prednisone Rash  . Sulfa Antibiotics Anaphylaxis  . Codeine Other (See Comments)    Go limp, sweat Low energy, unable to function.   . Cortisone Other (See Comments)    Body becomes red and gets very hot and burns like sunburn  . Aspirin Palpitations    REVIEW OF SYSTEMS:   CONSTITUTIONAL:  No fever, fatigue or weakness.  EYES: No blurred or double vision.  EARS, NOSE, AND THROAT: No tinnitus or ear pain.  RESPIRATORY: No cough, shortness of breath, wheezing or hemoptysis.  CARDIOVASCULAR: No chest pain, orthopnea, edema.  GASTROINTESTINAL: No nausea, vomiting, diarrhea or abdominal pain.  GENITOURINARY: No dysuria, hematuria.  ENDOCRINE: No polyuria, nocturia,  HEMATOLOGY: No anemia, easy bruising or bleeding SKIN: No rash or lesion. MUSCULOSKELETAL: Right knee joint pain .   NEUROLOGIC: No tingling, numbness, weakness.  PSYCHIATRY: No anxiety or depression.   MEDICATIONS AT HOME:  Prior to Admission medications   Medication Sig Start Date End Date Taking? Authorizing Provider  albuterol (PROAIR HFA) 108 (90 Base) MCG/ACT inhaler  Inhale 2 puffs into the lungs every 4 (four) hours as needed for wheezing or shortness of breath. 10/30/16  Yes Conty, Linward Foster, MD  ALPRAZolam Duanne Moron) 0.5 MG tablet TAKE ONE TABLET TWICE DAILY AS NEEDED FOR ANXIETY 03/11/15  Yes Corcoran, Melissa C, MD  amLODipine (NORVASC) 2.5 MG tablet Take 2.5 mg by mouth daily.  12/23/17  Yes [provider]  aspirin EC 81 MG tablet Take 81 mg by mouth daily.   Yes [provider]  COMBIVENT RESPIMAT 20-100 MCG/ACT AERS respimat Inhale 1 puff into the lungs 4 (four) times daily.  10/26/16  Yes [provider]  hydrochlorothiazide (HYDRODIURIL) 12.5 MG tablet Take 12.5 mg by mouth daily.  10/25/17 10/25/18 Yes [provider]  ipratropium-albuterol (DUONEB) 0.5-2.5 (3) MG/3ML SOLN Inhale 3 mLs into the lungs 4 (four) times daily as needed for wheezing.   Yes [provider]  metoprolol (LOPRESSOR) 50 MG tablet Take 50 mg by mouth daily.    Yes [provider]  PARoxetine (PAXIL) 40 MG tablet Take 40 mg by mouth every morning. 11/29/17  Yes [provider]  SYNTHROID 88 MCG tablet TAKE ONE TABLET BY MOUTH EVERY MORNING BEFORE BREAKFAST 03/13/17  Yes Corcoran, Melissa C, MD      PHYSICAL EXAMINATION:   VITAL SIGNS: Blood pressure (!) 149/61, pulse 92, temperature 99.1 F (37.3 C), temperature source Oral, resp. rate 18, height 5\' 5"  (1.651 m), weight 65.9 kg (145 lb 6 oz), SpO2 98 %.  GENERAL:  84 y.o.-year-old patient lying in the bed with no acute distress.  EYES: Pupils equal, round, reactive to light and accommodation. No scleral icterus. Extraocular muscles intact.  HEENT: Head atraumatic, normocephalic. Oropharynx and nasopharynx clear.  NECK:  Supple, no jugular venous distention. No thyroid enlargement, no tenderness.  LUNGS: Normal breath sounds bilaterally, no wheezing, rales,rhonchi or crepitation. No use of accessory muscles of respiration.  Supplemental oxygen in use at 2 L/min. CARDIOVASCULAR:  S1, S2 normal. No murmurs, rubs, or gallops.  ABDOMEN: Soft, nontender, nondistended. Bowel sounds present. No organomegaly or mass.  EXTREMITIES: No pedal edema, cyanosis, or clubbing.  Right knee dressing present. NEUROLOGIC: Cranial nerves II through XII are intact. Muscle strength 4/5 in all extremities. Sensation intact. Gait not checked.  PSYCHIATRIC: The patient is alert and oriented x 1-she do not know where she is or the date or time but she could identify her son and sister into the room.  SKIN: No obvious rash, lesion, or ulcer.   LABORATORY PANEL:   CBC Recent Labs  Lab 01/23/18 0446 01/24/18 0441  WBC 7.2 9.4  HGB 10.8* 10.5*  HCT 31.8* 30.8*  PLT 174 173  MCV 83.9 83.8  MCH 28.4 28.5  MCHC 33.8 34.0  RDW 13.5 13.0   ------------------------------------------------------------------------------------------------------------------  Chemistries  Recent Labs  Lab 01/23/18 0446  NA 139  K 3.8  CL 95*  CO2 36*  GLUCOSE 130*  BUN 16  CREATININE 0.65  CALCIUM 8.6*   ------------------------------------------------------------------------------------------------------------------ estimated creatinine clearance is 47.1 mL/min (by C-G formula based on SCr of 0.65 mg/dL). ------------------------------------------------------------------------------------------------------------------ No results for input(s): TSH, T4TOTAL, T3FREE, THYROIDAB in the last 72 hours.  Invalid input(s): FREET3   Coagulation profile No results for input(s): INR, PROTIME in the last 168 hours. ------------------------------------------------------------------------------------------------------------------- No results for input(s): DDIMER in the last 72 hours. -------------------------------------------------------------------------------------------------------------------  Cardiac Enzymes No results for input(s): CKMB, TROPONINI, MYOGLOBIN in the last 168 hours.  Invalid input(s):  CK ------------------------------------------------------------------------------------------------------------------ Invalid input(s): POCBNP  ---------------------------------------------------------------------------------------------------------------  Urinalysis    Component Value Date/Time   COLORURINE YELLOW (A) 01/13/2018 1437   APPEARANCEUR CLEAR (A) 01/13/2018 1437   LABSPEC 1.011 01/13/2018 1437   PHURINE 6.0 01/13/2018 1437   GLUCOSEU NEGATIVE 01/13/2018 1437   HGBUR NEGATIVE 01/13/2018 1437   BILIRUBINUR NEGATIVE 01/13/2018 1437   KETONESUR NEGATIVE 01/13/2018 1437   PROTEINUR NEGATIVE 01/13/2018 1437   NITRITE NEGATIVE 01/13/2018 1437   LEUKOCYTESUR NEGATIVE 01/13/2018 1437     RADIOLOGY: Dg Chest 2 View  Result Date: 01/24/2018 CLINICAL DATA:  Confusion.  History of COPD, breast cancer, smoker. EXAM: CHEST - 2 VIEW COMPARISON:  Chest x-ray dated 11/06/2016. FINDINGS: Borderline cardiomegaly is stable. Overall cardiomediastinal silhouette is stable. Chain sutures again noted at the level of the RIGHT upper lung. No new opacity to suggest a developing pneumonia. No pulmonary edema. No pleural effusion or pneumothorax seen. No acute or suspicious osseous finding. IMPRESSION: No active cardiopulmonary disease. No evidence of pneumonia or pulmonary edema. Electronically Signed   By: Franki Cabot M.D.   On: 01/24/2018 17:00   Ct Head Wo Contrast  Result Date: 01/24/2018 CLINICAL DATA:  Increasing confusion. EXAM: CT HEAD WITHOUT CONTRAST TECHNIQUE: Contiguous axial images were obtained from the base of the skull through the vertex without intravenous contrast. COMPARISON:  11/06/2016 FINDINGS: Brain: There is no evidence of acute infarct, intracranial hemorrhage, mass, midline shift, or extra-axial fluid collection. The ventricles and sulci are unchanged and within normal limits for age. No significant white matter disease is evident. Vascular: Calcified atherosclerosis at the  skull base. No hyperdense vessel. Skull: No fracture or focal osseous lesion. Sinuses/Orbits: Mid left ethmoid air cell opacification. Clear mastoid air cells. Bilateral cataract extraction. Other: None. IMPRESSION: Unremarkable CT appearance of the brain for age. Electronically Signed   By: Logan Bores M.D.   On: 01/24/2018 17:01    EKG: Orders placed or performed during the hospital encounter of 11/06/16  . EKG 12-Lead  . EKG 12-Lead  . ED EKG  . ED EKG  . EKG    IMPRESSION AND PLAN:  * Altered mental status This could be either infection like UTI or atelectasis/or effect of pain medications/or is a real possibility metastasis to her brain from her cancers. Ordered urinalysis, chest x-ray, CT scan of the head without contrast. We will also check ammonia level. Currently will not start on antibiotics until we get the results. Hold pain meds, try only tylenol.  * Hypertension Continue amlodipine and metoprolol.  * COPD with oxygen use at home at nighttime. Continue DuoNeb.  *Hypothyroidism Continue levothyroxine.  All the records are reviewed and case discussed with ED provider. Management plans discussed with the patient, family and they are in agreement.  CODE STATUS: Full code.    Code Status Orders  (From admission, onward)  Start     Ordered   01/22/18 1828  Full code  Continuous     01/22/18 1827    Code Status History    Date Active Date Inactive Code Status Order ID Comments User Context   11/01/2016 0257 11/03/2016 1920 Full Code [id number]  Harvie Bridge, DO Inpatient    Advance Directive Documentation     Most Recent Value  Type of Advance Directive  Healthcare Power of Attorney [Forrest Gwenlyn Found Jr.]  Pre-existing out of facility DNR order (yellow form or pink MOST form)  -  "MOST" Form in Place?  -       TOTAL TIME TAKING CARE OF THIS PATIENT: 50 minutes.    Vaughan Basta M.D on 01/24/2018   Between 7am to 6pm - Pager -  [phone]  After 6pm go to [email or web address] - password [email or web address] [facility]  Office  [phone]  CC: Primary care physician; Juluis Pitch, MD   Note: This dictation was prepared with Dragon dictation along with smaller phrase technology. Any transcriptional errors that result from this process are unintentional.

## 2018-01-25 LAB — CBC
HEMATOCRIT: 27.3 % — AB (ref 35.0–47.0)
Hemoglobin: 9.4 g/dL — ABNORMAL LOW (ref 12.0–16.0)
MCH: 28.8 pg (ref 26.0–34.0)
MCHC: 34.3 g/dL (ref 32.0–36.0)
MCV: 83.9 fL (ref 80.0–100.0)
PLATELETS: 167 10*3/uL (ref 150–440)
RBC: 3.26 MIL/uL — ABNORMAL LOW (ref 3.80–5.20)
RDW: 13.3 % (ref 11.5–14.5)
WBC: 10.1 10*3/uL (ref 3.6–11.0)

## 2018-01-25 LAB — COMPREHENSIVE METABOLIC PANEL
ALT: 10 U/L (ref 0–44)
AST: 24 U/L (ref 15–41)
Albumin: 3 g/dL — ABNORMAL LOW (ref 3.5–5.0)
Alkaline Phosphatase: 67 U/L (ref 38–126)
Anion gap: 7 (ref 5–15)
BUN: 15 mg/dL (ref 8–23)
CHLORIDE: 94 mmol/L — AB (ref 98–111)
CO2: 34 mmol/L — ABNORMAL HIGH (ref 22–32)
CREATININE: 0.67 mg/dL (ref 0.44–1.00)
Calcium: 9 mg/dL (ref 8.9–10.3)
GFR calc Af Amer: >60 mL/min (ref >60)
GLUCOSE: 119 mg/dL — AB (ref 70–99)
Potassium: 3.8 mmol/L (ref 3.5–5.1)
Sodium: 135 mmol/L (ref 135–145)
Total Bilirubin: 1 mg/dL (ref 0.3–1.2)
Total Protein: 5.8 g/dL — ABNORMAL LOW (ref 6.5–8.1)

## 2018-01-25 LAB — AMMONIA: AMMONIA: 22 umol/L (ref 9–35)

## 2018-01-25 MED ORDER — ACETAMINOPHEN 325 MG PO TABS: 325.0000 mg | ORAL_TABLET | Freq: Four times a day (QID) | ORAL | 0 refills | Status: AC | PRN

## 2018-01-25 MED ORDER — DOCUSATE SODIUM 100 MG PO CAPS: 100.0000 mg | ORAL_CAPSULE | Freq: Two times a day (BID) | ORAL | 0 refills | Status: DC

## 2018-01-25 MED ORDER — IPRATROPIUM-ALBUTEROL 0.5-2.5 (3) MG/3ML IN SOLN
3.0000 mL | Freq: Three times a day (TID) | RESPIRATORY_TRACT | Status: DC
  Administered 2018-01-25: 3 mL via RESPIRATORY_TRACT
  Filled 2018-01-25: qty 3

## 2018-01-25 MED ORDER — ASPIRIN 81 MG PO CHEW: 81.0000 mg | CHEWABLE_TABLET | Freq: Two times a day (BID) | ORAL | 0 refills | Status: AC

## 2018-01-25 NOTE — Discharge Summary (Signed)
Physician Discharge Summary  Patient ID: Rose Irwin MRN: [id number] DOB/AGE: 1931-09-18 84 y.o.  Admit date: 01/22/2018 Discharge date: 01/25/2018  Admission Diagnoses:  OSTEOARTHRITIS OF RIGHT KNEE JOINT <principal problem not specified>  Discharge Diagnoses:  OSTEOARTHRITIS OF RIGHT KNEE JOINT Active Problems:   S/P TKR (total knee replacement) using cement, right   Past Medical History:  Diagnosis Date  . Anxiety   . Arthritis   . Breast cancer ([place]) 11/21/2011   right breast cancer - radiation  . Bronchitis   . Cancer ([place]) 1999    thyroid  . Complication of anesthesia    smothering  . COPD (chronic obstructive pulmonary disease) ([place])   . Depression   . Elevated lipids   . Family history of adverse reaction to anesthesia    sister PONV  . GERD (gastroesophageal reflux disease)   . Hemorrhoids   . History of kidney stones   . Hypertension   . Hypothyroidism   . Neuropathy    rt leg  . Osteopenia   . Personal history of radiation therapy 2013   right breast ca  . Skin cancer     Surgeries: Procedure(s): TOTAL KNEE ARTHROPLASTY on 01/22/2018   Consultants (if any): Treatment Team:  Vaughan Basta, MD  Discharged Condition: Improved  Hospital Course: Rose Irwin is an 84 y.o. female who was admitted 01/22/2018 with a diagnosis of  OSTEOARTHRITIS OF RIGHT KNEE JOINT <principal problem not specified> and went to the operating room on 01/22/2018 and underwent the above named procedures.    She was given perioperative antibiotics:  Anti-infectives (From admission, onward)   Start     Dose/Rate Route Frequency Ordered Stop   01/22/18 2000  ceFAZolin (ANCEF) IVPB 1 g/50 mL premix     1 g 100 mL/hr over 30 Minutes Intravenous Every 6 hours 01/22/18 1827 01/23/18 0532   01/22/18 1440  50,000 units bacitracin in 0.9% normal saline 250 mL irrigation  Status:  Discontinued       As needed 01/22/18 1523 01/22/18 1557   01/22/18 1028  ceFAZolin (ANCEF) 2-4  GM/100ML-% IVPB    Note to Pharmacy:  Dewayne Hatch   : cabinet override      01/22/18 1028 01/22/18 1418   01/22/18 0600  ceFAZolin (ANCEF) IVPB 2g/100 mL premix     2 g 200 mL/hr over 30 Minutes Intravenous On call to O.R. 01/21/18 2206 01/22/18 1448    .  She was given sequential compression devices, early ambulation, and ECASA for DVT prophylaxis. She did have an episode of confusion and the hospitalist was consulted. The confusion resolved and was attributed to her pain medications. She will be discharge with tylenol only for pain.  She benefited maximally from the hospital stay and there were no complications.    Recent vital signs:  Vitals:   01/25/18 0745 01/25/18 0815  BP:  (!) 119/51  Pulse:  (!) 114  Resp:    Temp:  98.7 F (37.1 C)  SpO2: 97% 93%    Recent laboratory studies:  Lab Results  Component Value Date   HGB 9.4 (L) 01/25/2018   HGB 10.5 (L) 01/24/2018   HGB 10.8 (L) 01/23/2018   Lab Results  Component Value Date   WBC 10.1 01/25/2018   PLT 167 01/25/2018   Lab Results  Component Value Date   INR 1.06 01/13/2018   Lab Results  Component Value Date   NA 135 01/25/2018   K 3.8 01/25/2018   CL 94 (  L) 01/25/2018   CO2 34 (H) 01/25/2018   BUN 15 01/25/2018   CREATININE 0.67 01/25/2018   GLUCOSE 119 (H) 01/25/2018    Discharge Medications:   Allergies as of 01/25/2018      Reactions   Prednisone Rash   Sulfa Antibiotics Anaphylaxis   Codeine Other (See Comments)   Go limp, sweat Low energy, unable to function.   Cortisone Other (See Comments)   Body becomes red and gets very hot and burns like sunburn   Aspirin Palpitations      Medication List    STOP taking these medications   aspirin EC 81 MG tablet Replaced by:  aspirin 81 MG chewable tablet     TAKE these medications   acetaminophen 325 MG tablet Commonly known as:  TYLENOL Take 1-2 tablets (325-650 mg total) by mouth every 6 (six) hours as needed for mild pain (pain score  1-3 or temp > 100.5).   albuterol 108 (90 Base) MCG/ACT inhaler Commonly known as:  PROAIR HFA Inhale 2 puffs into the lungs every 4 (four) hours as needed for wheezing or shortness of breath.   ALPRAZolam 0.5 MG tablet Commonly known as:  XANAX TAKE ONE TABLET TWICE DAILY AS NEEDED FOR ANXIETY   amLODipine 2.5 MG tablet Commonly known as:  NORVASC Take 2.5 mg by mouth daily.   aspirin 81 MG chewable tablet Chew 1 tablet (81 mg total) by mouth 2 (two) times daily. Replaces:  aspirin EC 81 MG tablet   ipratropium-albuterol 0.5-2.5 (3) MG/3ML Soln Commonly known as:  DUONEB Inhale 3 mLs into the lungs 4 (four) times daily as needed for wheezing.   COMBIVENT RESPIMAT 20-100 MCG/ACT Aers respimat Generic drug:  Ipratropium-Albuterol Inhale 1 puff into the lungs 4 (four) times daily.   docusate sodium 100 MG capsule Commonly known as:  COLACE Take 1 capsule (100 mg total) by mouth 2 (two) times daily.   hydrochlorothiazide 12.5 MG tablet Commonly known as:  HYDRODIURIL Take 12.5 mg by mouth daily.   metoprolol tartrate 50 MG tablet Commonly known as:  LOPRESSOR Take 50 mg by mouth daily.   PARoxetine 40 MG tablet Commonly known as:  PAXIL Take 40 mg by mouth every morning.   SYNTHROID 88 MCG tablet Generic drug:  levothyroxine TAKE ONE TABLET BY MOUTH EVERY MORNING BEFORE BREAKFAST       Diagnostic Studies: Dg Chest 2 View  Result Date: 01/24/2018 CLINICAL DATA:  Confusion.  History of COPD, breast cancer, smoker. EXAM: CHEST - 2 VIEW COMPARISON:  Chest x-ray dated 11/06/2016. FINDINGS: Borderline cardiomegaly is stable. Overall cardiomediastinal silhouette is stable. Chain sutures again noted at the level of the RIGHT upper lung. No new opacity to suggest a developing pneumonia. No pulmonary edema. No pleural effusion or pneumothorax seen. No acute or suspicious osseous finding. IMPRESSION: No active cardiopulmonary disease. No evidence of pneumonia or pulmonary edema.  Electronically Signed   By: Franki Cabot M.D.   On: 01/24/2018 17:00   Ct Head Wo Contrast  Result Date: 01/24/2018 CLINICAL DATA:  Increasing confusion. EXAM: CT HEAD WITHOUT CONTRAST TECHNIQUE: Contiguous axial images were obtained from the base of the skull through the vertex without intravenous contrast. COMPARISON:  11/06/2016 FINDINGS: Brain: There is no evidence of acute infarct, intracranial hemorrhage, mass, midline shift, or extra-axial fluid collection. The ventricles and sulci are unchanged and within normal limits for age. No significant white matter disease is evident. Vascular: Calcified atherosclerosis at the skull base. No hyperdense vessel. Skull: No  fracture or focal osseous lesion. Sinuses/Orbits: Mid left ethmoid air cell opacification. Clear mastoid air cells. Bilateral cataract extraction. Other: None. IMPRESSION: Unremarkable CT appearance of the brain for age. Electronically Signed   By: Logan Bores M.D.   On: 01/24/2018 17:01   Mr Knee Right Wo Contrast  Result Date: 12/30/2017 CLINICAL DATA:  Chronic right knee pain with catching. The knee gives way. EXAM: MRI OF THE RIGHT KNEE WITHOUT CONTRAST TECHNIQUE: Multiplanar, multisequence MR imaging of the knee was performed. No intravenous contrast was administered. COMPARISON:  None. FINDINGS: MENISCI Medial meniscus: There is an irregular horizontal tear of the undersurface of the posterior horn. Lateral meniscus: Complex tear, primarily horizontal, involving the entire lateral meniscus with extension to the periphery posteriorly. LIGAMENTS Cruciates: Diffuse mucoid degeneration of the intact ACL. PCL is normal. Collaterals:  Normal. CARTILAGE Patellofemoral: Thinning of the articular cartilage of the medial and lateral facets with a focal area of full-thickness cartilage loss of the inferior aspect of the lateral facet. Medial: Small focal fissure in the central portion of the femoral condyle. Lateral: Extensive full-thickness  cartilage loss of the femoral condyle and tibial plateau with patchy subcortical edema. Joint: Large joint effusion. Multiple loose bodies in the posterior aspect of the joint. Some synovial hypertrophy. Popliteal Fossa: Complex 6 x 3 x 2 cm Baker's cyst. Intact popliteus tendon. Extensor Mechanism:  Normal. Bones: Prominent marginal osteophytes in the medial and lateral compartments. Other: None IMPRESSION: 1. Moderately severe arthritic changes of the lateral compartment with extensive cartilage loss, joint space narrowing, marginal osteophyte formation, and a complex tear involving the entire lateral meniscus. 2. Horizontal tear of the undersurface of the posterior horn of the medial meniscus. 3. Full-thickness cartilage loss on the inferior aspect of the lateral facet of the patella. 4. Prominent joint effusion with multiple small loose bodies in the joint. 5. Small complex Baker's cyst. Electronically Signed   By: Lorriane Shire M.D.   On: 12/30/2017 11:57   Dg Knee Right Port  Result Date: 01/22/2018 CLINICAL DATA:  Right total knee replacement. EXAM: PORTABLE RIGHT KNEE - 1-2 VIEW COMPARISON:  Right knee MRI dated December 30, 2017. FINDINGS: The right knee demonstrates a total knee arthroplasty without evidence of hardware failure or complication. There is expected intra-articular air. There is no fracture or dislocation. The alignment is anatomic. Post-surgical changes noted in the surrounding soft tissues. IMPRESSION: Right total knee arthroplasty without evidence of acute postoperative complication. Electronically Signed   By: Titus Dubin M.D.   On: 01/22/2018 16:21   Dg Bone Length  Result Date: 12/30/2017 CLINICAL DATA:  Osteoarthritis of the right knee. EXAM: BONE LENGTH COMPARISON:  MRI dated 12/30/2017 FINDINGS: There is moderately severe arthritis of the lateral compartment with lateral joint space narrowing and valgus deformity. Femur length is 531.3 cm.  Tibial length is 411.3 cm. No  significant abnormalities of the right hip or right ankle. IMPRESSION: Osteoarthritis of the right knee with valgus deformity. Measurements as described above. Electronically Signed   By: Lorriane Shire M.D.   On: 12/30/2017 12:01   Mm Diag Breast Tomo Bilateral  Result Date: 12/31/2017 CLINICAL DATA:  84 year old female status post malignant right lumpectomy in 2013 with radiation therapy. EXAM: DIGITAL DIAGNOSTIC BILATERAL MAMMOGRAM WITH CAD AND TOMO COMPARISON:  Previous exam(s). ACR Breast Density Category b: There are scattered areas of fibroglandular density. FINDINGS: Stable postsurgical changes are demonstrated in the right breast. No suspicious mammographic findings are identified in either breast. Mammographic images were processed  with CAD. IMPRESSION: 1. No mammographic evidence of malignancy in either breast. 2. Stable right breast posttreatment changes. RECOMMENDATION: Screening mammogram in one year.(Code:SM-B-01Y) I have discussed the findings and recommendations with the patient. Results were also provided in writing at the conclusion of the visit. If applicable, a reminder letter will be sent to the patient regarding the next appointment. BI-RADS CATEGORY  2: Benign. Electronically Signed   By: Kristopher Oppenheim M.D.   On: 12/31/2017 11:14    Disposition: Discharge disposition: 03-Skilled Nursing Facility         Contact information for after-discharge care    Destination    [facility] .   Service:  Skilled Nursing Contact information: [address] [place] [phone]               Signed: Lovell Sheehan ,MD 01/25/2018, 12:24 PM

## 2018-01-25 NOTE — Progress Notes (Signed)
Physical Therapy Treatment Patient Details Name: Rose Irwin MRN: [id number] DOB: August 05, 1931 Today's Date: 01/25/2018    History of Present Illness Patient is a pleasant 84 year old female who presents to hospital for TKA RLE with cement.  Patient's sons and daughter in law present for evaluation. PMH: anxiety, anthritis, breast cancer, bronchitis, thyroid cancer, COPD, depression, GERD, hemorrhoids, HTN, Hypothyroidism, Neuropathy (RLE), and Osteopenia.     PT Comments    Pt initially unsure of therapy session but participated well once started.  Confusion remains but easily directed to tasks.  Participated in exercises as described below.  To edge of bed with min assist.  Stood and was able to transfer to chair with min assist.  She then stood and ambulated a small lap in room with walker and min assist for balance and to navigate walker.  Overall tolerated well with increased mobility.  SNF remains appropriate.   Follow Up Recommendations  SNF     Equipment Recommendations       Recommendations for Other Services       Precautions / Restrictions Precautions Precautions: Knee Precaution Comments: R knee TKA, able to perform SLR  Restrictions Weight Bearing Restrictions: Yes RLE Weight Bearing: Weight bearing as tolerated Other Position/Activity Restrictions: WBAT with RLE,    Mobility  Bed Mobility Overal bed mobility: Needs Assistance Bed Mobility: Supine to Sit     Supine to sit: Min assist        Transfers Overall transfer level: Needs assistance Equipment used: Standard walker Transfers: Sit to/from Stand Sit to Stand: Min assist            Ambulation/Gait Ambulation/Gait assistance: Min Web designer (Feet): 10 Feet Assistive device: Rolling walker (2 wheeled) Gait Pattern/deviations: Step-through pattern;Decreased step length - right;Decreased step length - left Gait velocity: decreased   General Gait Details: overll did well with no  buckling.  Slightly unsteady requires min assist for safety and assist for walker.   Stairs             Wheelchair Mobility    Modified Rankin (Stroke Patients Only)       Balance Overall balance assessment: Needs assistance Sitting-balance support: Single extremity supported;Feet supported Sitting balance-Leahy Scale: Fair     Standing balance support: Bilateral upper extremity supported Standing balance-Leahy Scale: Poor                              Cognition Arousal/Alertness: Awake/alert Behavior During Therapy: WFL for tasks assessed/performed Overall Cognitive Status: Impaired/Different from baseline Area of Impairment: Following commands;Memory;Attention                       Following Commands: Follows one step commands consistently;Follows multi-step commands inconsistently              Exercises Total Joint Exercises Ankle Circles/Pumps: AROM;Both;10 reps;Supine Quad Sets: Limitations Short Arc Quad: AAROM;10 reps;Right;Supine Heel Slides: 10 reps;AAROM;Supine;Right Hip ABduction/ADduction: 10 reps;AAROM;Right;Supine Straight Leg Raises: 10 reps;AAROM;Right;Supine Long Arc Quad: 10 reps;AAROM;Right;Seated Knee Flexion: AAROM;Right;Seated;5 reps Goniometric ROM: 0-77 - resisted further flexion Marching in Standing: 10 reps;AROM;Right;Standing    General Comments        Pertinent Vitals/Pain Pain Assessment: 0-10 Faces Pain Scale: Hurts even more Pain Location: R knee with movement Pain Descriptors / Indicators: Aching;Operative site guarding Pain Intervention(s): Limited activity within patient's tolerance;Monitored during session    Home Living  Prior Function            PT Goals (current goals can now be found in the care plan section) Progress towards PT goals: Progressing toward goals    Frequency    BID      PT Plan Current plan remains appropriate    Co-evaluation               AM-PAC PT "6 Clicks" Daily Activity  Outcome Measure  Difficulty turning over in bed (including adjusting bedclothes, sheets and blankets)?: A Little Difficulty moving from lying on back to sitting on the side of the bed? : Unable Difficulty sitting down on and standing up from a chair with arms (e.g., wheelchair, bedside commode, etc,.)?: Unable Help needed moving to and from a bed to chair (including a wheelchair)?: A Little Help needed walking in hospital room?: A Lot Help needed climbing 3-5 steps with a railing? : Total 6 Click Score: 11    End of Session Equipment Utilized During Treatment: Gait belt;Oxygen Activity Tolerance: Patient tolerated treatment well Patient left: in chair;with chair alarm set;with call bell/phone within reach;with family/visitor present Nurse Communication: Mobility status Pain - Right/Left: Right Pain - part of body: Knee     Time: 7681-1572 PT Time Calculation (min) (ACUTE ONLY): 30 min  Charges:  $Gait Training: 8-22 mins $Therapeutic Exercise: 8-22 mins                    G Codes:       Chesley Noon, PTA 01/25/18, 11:35 AM

## 2018-01-25 NOTE — Discharge Instructions (Signed)
Continue weight bear as tolerated on the right lower extremity.    Elevate the right lower extremity whenever possible and continue the polar care while elevating the extremity. Patient may shower. No bath or submerging the wound.    Take ECASA as directed for blood clot prevention.  Continue to work on knee range of motion exercises at home as instructed by physical therapy. Continue to use a walker for assistance with ambulation until cleared by physical therapy.  Call [phone] with any questions, such as fever > 101.5 degrees, drainage from the wound or shortness of breath.

## 2018-01-25 NOTE — Progress Notes (Signed)
Report called to [facility], EMS called. Waiting on transport.

## 2018-01-25 NOTE — Progress Notes (Signed)
[facility] [place] NAME: Weda Baumgarner    MR#:  [id number]  DATE OF BIRTH:  1931/11/19  SUBJECTIVE:  patient was initially confused intermittently. During my evaluation and conversation she appeared much clearer and made conversation that made sense. Son agrees with it was in the room. Had some nausea in the morning with little emesis. Feels a lot better. She seems to be very anxious a bit and a [employer], [employer].  REVIEW OF SYSTEMS:   Review of Systems  Constitutional: Negative for chills, fever and weight loss.  HENT: Negative for ear discharge, ear pain and nosebleeds.   Eyes: Negative for blurred vision, pain and discharge.  Respiratory: Negative for sputum production, shortness of breath, wheezing and stridor.   Cardiovascular: Negative for chest pain, palpitations, orthopnea and PND.  Gastrointestinal: Negative for abdominal pain, diarrhea, nausea and vomiting.  Genitourinary: Negative for frequency and urgency.  Musculoskeletal: Positive for joint pain. Negative for back pain.  Neurological: Positive for weakness. Negative for sensory change, speech change and focal weakness.  Psychiatric/Behavioral: Negative for depression and hallucinations. The patient is not nervous/anxious.    Tolerating Diet:yes Tolerating PT: recommends SNF  DRUG ALLERGIES:   Allergies  Allergen Reactions  . Prednisone Rash  . Sulfa Antibiotics Anaphylaxis  . Codeine Other (See Comments)    Go limp, sweat Low energy, unable to function.   . Cortisone Other (See Comments)    Body becomes red and gets very hot and burns like sunburn  . Aspirin Palpitations    VITALS:  Blood pressure (!) 119/51, pulse (!) 114, temperature 98.7 F (37.1 C), temperature source Oral, resp. rate 18, height 5\' 5"  (1.651 m), weight 65.9 kg (145 lb 6 oz), SpO2 93 %.  PHYSICAL EXAMINATION:   Physical Exam  GENERAL:  84 y.o.-year-old patient lying in the bed with no acute  distress.  EYES: Pupils equal, round, reactive to light and accommodation. No scleral icterus. Extraocular muscles intact.  HEENT: Head atraumatic, normocephalic. Oropharynx and nasopharynx clear.  NECK:  Supple, no jugular venous distention. No thyroid enlargement, no tenderness.  LUNGS: Normal breath sounds bilaterally, no wheezing, rales, rhonchi. No use of accessory muscles of respiration.  CARDIOVASCULAR: S1, S2 normal. No murmurs, rubs, or gallops.  ABDOMEN: Soft, nontender, nondistended. Bowel sounds present. No organomegaly or mass.  EXTREMITIES: No cyanosis, clubbing or edema b/l.   Right knee brace + NEUROLOGIC: Cranial nerves II through XII are intact. No focal Motor or sensory deficits b/l.   PSYCHIATRIC:  patient is alert and oriented x 3.  SKIN: No obvious rash, lesion, or ulcer.   LABORATORY PANEL:  CBC Recent Labs  Lab 01/25/18 0604  WBC 10.1  HGB 9.4*  HCT 27.3*  PLT 167    Chemistries  Recent Labs  Lab 01/25/18 0604  NA 135  K 3.8  CL 94*  CO2 34*  GLUCOSE 119*  BUN 15  CREATININE 0.67  CALCIUM 9.0  AST 24  ALT 10  ALKPHOS 67  BILITOT 1.0   Cardiac Enzymes No results for input(s): TROPONINI in the last 168 hours. RADIOLOGY:  Dg Chest 2 View  Result Date: 01/24/2018 CLINICAL DATA:  Confusion.  History of COPD, breast cancer, smoker. EXAM: CHEST - 2 VIEW COMPARISON:  Chest x-ray dated 11/06/2016. FINDINGS: Borderline cardiomegaly is stable. Overall cardiomediastinal silhouette is stable. Chain sutures again noted at the level of the RIGHT upper lung. No new opacity to suggest a developing pneumonia. No pulmonary edema.  No pleural effusion or pneumothorax seen. No acute or suspicious osseous finding. IMPRESSION: No active cardiopulmonary disease. No evidence of pneumonia or pulmonary edema. Electronically Signed   By: Franki Cabot M.D.   On: 01/24/2018 17:00   Ct Head Wo Contrast  Result Date: 01/24/2018 CLINICAL DATA:  Increasing confusion. EXAM: CT  HEAD WITHOUT CONTRAST TECHNIQUE: Contiguous axial images were obtained from the base of the skull through the vertex without intravenous contrast. COMPARISON:  11/06/2016 FINDINGS: Brain: There is no evidence of acute infarct, intracranial hemorrhage, mass, midline shift, or extra-axial fluid collection. The ventricles and sulci are unchanged and within normal limits for age. No significant white matter disease is evident. Vascular: Calcified atherosclerosis at the skull base. No hyperdense vessel. Skull: No fracture or focal osseous lesion. Sinuses/Orbits: Mid left ethmoid air cell opacification. Clear mastoid air cells. Bilateral cataract extraction. Other: None. IMPRESSION: Unremarkable CT appearance of the brain for age. Electronically Signed   By: Logan Bores M.D.   On: 01/24/2018 17:01   ASSESSMENT AND PLAN:  Azka Steger  is a 84 y.o. female with a known history of anxiety, breast cancer, bronchitis, thyroid cancer, COPD, depression, hyperlipidemia, gastroesophageal reflux disease, hemorrhoids, hypertension, hypothyroidism, osteopenia-admitted under orthopedic service for scheduled knee replacement surgery. Status post surgery day 2, she remains significantly confused so medical consult was called in.  * Altered mental status Likely due to  effect of pain medications// anesthesia/anxiety  urinalysis-differ UTI, chest x-ray-- active infection, CT scan of the head without contrast-negative  ammonia level within normal limits. Hold pain meds, try only tylenol. -She is my intention has improved remarkably. Son agrees with her.  * Hypertension Continue amlodipine and metoprolol.  * COPD with oxygen use at home at nighttime. Continue DuoNeb.  *Hypothyroidism Continue levothyroxine.  Medically currently stable. No further workup needed at this time. From medical standpoint okay to discharge to rehab. Son is agreeable. Will sign off call if needed  Case discussed with Care  Management/Social Worker. Management plans discussed with the patient, family and they are in agreement.  CODE STATUS: full  DVT Prophylaxis: per ortho  TOTAL TIME TAKING CARE OF THIS PATIENT: 30 minutes.  >50% time spent on counselling and coordination of care    Note: This dictation was prepared with Dragon dictation along with smaller phrase technology. Any transcriptional errors that result from this process are unintentional.  Fritzi Mandes M.D on 01/25/2018 at 12:09 PM  Between 7am to 6pm - Pager - [phone]  After 6pm go to [email or web address] - password [email or web address] [facility]  Office  [phone]  CC: Primary care physician; Juluis Pitch, MDPatient ID: Richarda Osmond, female   DOB: [birth year], 84 y.o.   MRN: [id number]

## 2018-01-25 NOTE — Clinical Social Work Note (Signed)
The patient will discharge today to [facility] via non-emergent EMS. The patient, her family, and the facility are aware and in agreement. The CSW has sent documentation to the facility and will deliver the discharge packet as soon as possible. At that time, the CSW will sign off. Please consult should needs arise.  Santiago Bumpers, MSW, Latanya Presser [phone]

## 2018-01-26 ENCOUNTER — Encounter: Payer: Self-pay | Admitting: Orthopedic Surgery

## 2018-01-27 ENCOUNTER — Encounter: Payer: Self-pay | Admitting: Orthopedic Surgery

## 2018-01-27 ENCOUNTER — Other Ambulatory Visit: Payer: Self-pay

## 2018-01-27 MED ORDER — ALPRAZOLAM 0.5 MG PO TABS: 0.5000 mg | ORAL_TABLET | Freq: Two times a day (BID) | ORAL | 0 refills | Status: AC | PRN

## 2018-01-27 NOTE — Telephone Encounter (Signed)
Rx sent to [facility] phone : [phone] , fax : [phone]  

## 2018-02-05 ENCOUNTER — Non-Acute Institutional Stay (SKILLED_NURSING_FACILITY): Payer: Medicare Other | Admitting: Adult Health

## 2018-02-05 ENCOUNTER — Encounter: Payer: Self-pay | Admitting: Adult Health

## 2018-02-05 DIAGNOSIS — F419 Anxiety disorder, unspecified: Secondary | ICD-10-CM | POA: Diagnosis not present

## 2018-02-05 DIAGNOSIS — E039 Hypothyroidism, unspecified: Secondary | ICD-10-CM | POA: Diagnosis not present

## 2018-02-05 DIAGNOSIS — I10 Essential (primary) hypertension: Secondary | ICD-10-CM

## 2018-02-05 DIAGNOSIS — M1711 Unilateral primary osteoarthritis, right knee: Secondary | ICD-10-CM | POA: Diagnosis not present

## 2018-02-05 DIAGNOSIS — J449 Chronic obstructive pulmonary disease, unspecified: Secondary | ICD-10-CM | POA: Diagnosis not present

## 2018-02-05 DIAGNOSIS — F329 Major depressive disorder, single episode, unspecified: Secondary | ICD-10-CM | POA: Diagnosis not present

## 2018-02-05 MED ORDER — COMBIVENT RESPIMAT 20-100 MCG/ACT IN AERS: 1.0000 | INHALATION_SPRAY | Freq: Four times a day (QID) | RESPIRATORY_TRACT | 0 refills | Status: AC

## 2018-02-05 MED ORDER — HYDROCHLOROTHIAZIDE 12.5 MG PO TABS: 12.5000 mg | ORAL_TABLET | Freq: Every day | ORAL | 0 refills | Status: AC

## 2018-02-05 MED ORDER — DOCUSATE SODIUM 100 MG PO CAPS: 100.0000 mg | ORAL_CAPSULE | Freq: Two times a day (BID) | ORAL | 0 refills | Status: AC

## 2018-02-05 MED ORDER — IPRATROPIUM-ALBUTEROL 0.5-2.5 (3) MG/3ML IN SOLN: 3.0000 mL | Freq: Four times a day (QID) | RESPIRATORY_TRACT | 0 refills | Status: AC | PRN

## 2018-02-05 MED ORDER — METOPROLOL TARTRATE 50 MG PO TABS: 50.0000 mg | ORAL_TABLET | Freq: Every day | ORAL | 0 refills | Status: AC

## 2018-02-05 MED ORDER — LEVOTHYROXINE SODIUM 88 MCG PO TABS: 88.0000 ug | ORAL_TABLET | Freq: Every day | ORAL | 0 refills | Status: AC

## 2018-02-05 MED ORDER — PAROXETINE HCL 40 MG PO TABS: 40.0000 mg | ORAL_TABLET | Freq: Every morning | ORAL | 0 refills | Status: AC

## 2018-02-05 MED ORDER — ALBUTEROL SULFATE HFA 108 (90 BASE) MCG/ACT IN AERS: 2.0000 | INHALATION_SPRAY | RESPIRATORY_TRACT | 1 refills | Status: AC | PRN

## 2018-02-05 NOTE — Progress Notes (Signed)
Location:  [facility] Room Number: [place]:  SNF [phone]) Provider:  Durenda Age, NP  Patient Care Team: Juluis Pitch, MD as PCP - General [facility])  Extended Emergency Contact Information Primary Emergency Contact: Berry,Forrest Address: [address] [place], [place] [address] Johnnette Litter of [place] Phone: [phone] Mobile Phone: [phone] Relation: Son Secondary Emergency Contact: Rebekah [place]field Address: [address]          [place], [place] [address] Johnnette Litter of [place] Phone: [phone] Relation: Sister  Code Status:  FULL  Goals of care: Advanced Directive information Advanced Directives 02/05/2018  Does Patient Have a Medical Advance Directive? Yes  Type of Advance Directive [place]  Does patient want to make changes to medical advance directive? No - Patient declined  Copy of [place] in Chart? Yes  Would patient like information on creating a medical advance directive? -      Chief Complaint  Patient presents with  . Discharge Note    Discharging 02/06/2018    HPI:  Pt is a 84 y.o. female seen today for a discharge visit. She will be be discharge home tomorrow and will have outpatient  PT.  She has been admitted to [facility] from a recent hospitalization for right knee osteoarthritis for which she had total knee replacement. She has PMH of anxiety, right breast cancer, COPD, GERD, and hypertension.   Patient was admitted to this facility for short-term rehabilitation after the patient's recent hospitalization.  Patient has completed SNF rehabilitation and therapy has cleared the patient for discharge.   Past Medical History:  Diagnosis Date  . Anxiety   . Arthritis   . Breast cancer ([place]) 11/21/2011   right breast cancer - radiation  . Bronchitis   . Cancer ([place]) 1999    thyroid  . Complication of anesthesia    smothering  . COPD (chronic obstructive pulmonary disease) ([place])   . Depression   . Elevated lipids   . Family history of adverse reaction to anesthesia    sister PONV  . GERD (gastroesophageal reflux disease)   . Hemorrhoids   . History of kidney stones   . Hypertension   . Hypothyroidism   . Neuropathy    rt leg  . Osteopenia   . Personal history of radiation therapy 2013   right breast ca  . Skin cancer    Past Surgical History:  Procedure Laterality Date  . ABDOMINAL HYSTERECTOMY    . BACK SURGERY     lumbar  . bladder polyps    . BREAST BIOPSY Right 10/2011   invasive mammary carcinoma  . BREAST BIOPSY Right yrs ago   benign  . BREAST EXCISIONAL BIOPSY Left 1961, 1970, 1990   benign  . BREAST LUMPECTOMY Right 11/2011   invasive mammary carcinoma, clear margins, negative LN  . BREAST SURGERY Right   . CYSTOSCOPY W/ RETROGRADES Bilateral 02/09/2015   Procedure: CYSTOSCOPY WITH RETROGRADE PYELOGRAM;  Surgeon: Hollice Espy, MD;  Location: [facility] ORS;  Service: Urology;  Laterality: Bilateral;  . LUNG SURGERY    . THYROID SURGERY    . TOTAL KNEE ARTHROPLASTY Right 01/22/2018   Procedure: TOTAL KNEE ARTHROPLASTY;  Surgeon: Lovell Sheehan, MD;  Location: [facility] ORS;  Service: Orthopedics;  Laterality: Right;    Allergies  Allergen Reactions  . Prednisone Rash  . Sulfa Antibiotics Anaphylaxis  .  Codeine Other (See Comments)    Go limp, sweat Low energy, unable to function.   . Cortisone Other (See Comments)    Body becomes red and gets very hot and burns like sunburn  . Aspirin Palpitations    Outpatient Encounter Medications as of 02/05/2018  Medication Sig  . acetaminophen (TYLENOL) 325 MG tablet Take 1-2 tablets (325-650 mg total) by mouth every 6 (six) hours as needed for mild pain (pain score 1-3 or temp > 100.5).  Marland Kitchen albuterol (PROAIR HFA) 108 (90 Base) MCG/ACT inhaler Inhale 2 puffs into the lungs every 4 (four) hours as needed for wheezing or shortness of  breath.  . ALPRAZolam (XANAX) 0.5 MG tablet Take 1 tablet (0.5 mg total) by mouth 2 (two) times daily as needed for anxiety.  Marland Kitchen aspirin 81 MG chewable tablet Chew 1 tablet (81 mg total) by mouth 2 (two) times daily.  . COMBIVENT RESPIMAT 20-100 MCG/ACT AERS respimat Inhale 1 puff into the lungs 4 (four) times daily.   Marland Kitchen docusate sodium (COLACE) 100 MG capsule Take 1 capsule (100 mg total) by mouth 2 (two) times daily.  . hydrochlorothiazide (HYDRODIURIL) 12.5 MG tablet Take 12.5 mg by mouth daily.   Marland Kitchen ipratropium-albuterol (DUONEB) 0.5-2.5 (3) MG/3ML SOLN Inhale 3 mLs into the lungs 4 (four) times daily as needed for wheezing.  Marland Kitchen levothyroxine (SYNTHROID, LEVOTHROID) 88 MCG tablet Take 88 mcg by mouth daily before breakfast.  . metoprolol (LOPRESSOR) 50 MG tablet Take 50 mg by mouth daily.   Marland Kitchen PARoxetine (PAXIL) 40 MG tablet Take 40 mg by mouth every morning.  . [DISCONTINUED] amLODipine (NORVASC) 2.5 MG tablet Take 2.5 mg by mouth daily.   . [DISCONTINUED] SYNTHROID 88 MCG tablet TAKE ONE TABLET BY MOUTH EVERY MORNING BEFORE BREAKFAST   No facility-administered encounter medications on file as of 02/05/2018.     Review of Systems  GENERAL: No change in appetite, no fatigue, no weight changes, no fever, chills or weakness MOUTH and THROAT: Denies oral discomfort, gingival pain or bleeding, pain from teeth or hoarseness   RESPIRATORY: no cough, SOB, DOE, wheezing, hemoptysis CARDIAC: No chest pain, edema or palpitations GI: No abdominal pain, diarrhea, constipation, heart burn, nausea or vomiting PSYCHIATRIC: Denies feelings of depression or anxiety. No report of hallucinations, insomnia, paranoia, or agitation   Immunization History  Administered Date(s) Administered  . Influenza Split 05/24/2015  . Influenza Whole 05/22/2017  . Influenza-Unspecified 05/22/2017   Pertinent  Health Maintenance Due  Topic Date Due  . PNA vac Low Risk Adult (1 of 2 - PCV13) 12/14/1998  . INFLUENZA  VACCINE  02/20/2018  . DEXA SCAN  Completed   No flowsheet data found. Functional Status Survey:    Vitals:   02/05/18 0838  BP: (!) 124/40  Pulse: 91  Resp: 18  Temp: 98.6 F (37 C)  TempSrc: Oral  SpO2: 98%  Weight: 147 lb 4.8 oz (66.8 kg)  Height: 5\' 5"  (1.651 m)   Body mass index is 24.51 kg/m.  Physical Exam  GENERAL APPEARANCE: Well nourished. In no acute distress. Normal body habitus SKIN:  Right knee surgical incision has steri-strips, dry and no erythema MOUTH and THROAT: Lips are without lesions. Oral mucosa is moist and without lesions. Tongue is normal in shape, size, and color and without lesions RESPIRATORY: Breathing is even & unlabored, BS CTAB CARDIAC: RRR, no murmur,no extra heart sounds, no edema GI: Abdomen soft, normal BS, no masses, no tenderness EXTREMITIES:  Able to move X 4  extremities PSYCHIATRIC: Alert and oriented X 3. Affect and behavior are appropriate  Labs reviewed: Recent Labs    01/13/18 1423 01/23/18 0446 01/25/18 0604  NA 138 139 135  K 4.2 3.8 3.8  CL 94* 95* 94*  CO2 36* 36* 34*  GLUCOSE 92 130* 119*  BUN 13 16 15   CREATININE 0.64 0.65 0.67  CALCIUM 9.9 8.6* 9.0   Recent Labs    04/17/17 1511 10/16/17 1034 01/25/18 0604  AST 24 20 24   ALT 16 12* 10  ALKPHOS 84 84 67  BILITOT 0.6 0.5 1.0  PROT 6.7 6.9 5.8*  ALBUMIN 3.9 3.7 3.0*   Recent Labs    04/17/17 1511 10/16/17 1034  01/23/18 0446 01/24/18 0441 01/25/18 0604  WBC 4.8 5.4   < > 7.2 9.4 10.1  NEUTROABS 3.3 3.7  --   --   --   --   HGB 13.8 13.3   < > 10.8* 10.5* 9.4*  HCT 40.8 39.8   < > 31.8* 30.8* 27.3*  MCV 83.7 82.8   < > 83.9 83.8 83.9  PLT 217 239   < > 174 173 167   < > = values in this interval not displayed.   Lab Results  Component Value Date   TSH 3.042 10/16/2017   No results found for: HGBA1C Lab Results  Component Value Date   CHOL 179 11/01/2016   HDL 85 11/01/2016   LDLCALC 84 11/01/2016   TRIG 49 11/01/2016   CHOLHDL 2.1  11/01/2016    Significant Diagnostic Results in last 30 days:  Dg Chest 2 View  Result Date: 01/24/2018 CLINICAL DATA:  Confusion.  History of COPD, breast cancer, smoker. EXAM: CHEST - 2 VIEW COMPARISON:  Chest x-ray dated 11/06/2016. FINDINGS: Borderline cardiomegaly is stable. Overall cardiomediastinal silhouette is stable. Chain sutures again noted at the level of the RIGHT upper lung. No new opacity to suggest a developing pneumonia. No pulmonary edema. No pleural effusion or pneumothorax seen. No acute or suspicious osseous finding. IMPRESSION: No active cardiopulmonary disease. No evidence of pneumonia or pulmonary edema. Electronically Signed   By: Franki Cabot M.D.   On: 01/24/2018 17:00   Ct Head Wo Contrast  Result Date: 01/24/2018 CLINICAL DATA:  Increasing confusion. EXAM: CT HEAD WITHOUT CONTRAST TECHNIQUE: Contiguous axial images were obtained from the base of the skull through the vertex without intravenous contrast. COMPARISON:  11/06/2016 FINDINGS: Brain: There is no evidence of acute infarct, intracranial hemorrhage, mass, midline shift, or extra-axial fluid collection. The ventricles and sulci are unchanged and within normal limits for age. No significant white matter disease is evident. Vascular: Calcified atherosclerosis at the skull base. No hyperdense vessel. Skull: No fracture or focal osseous lesion. Sinuses/Orbits: Mid left ethmoid air cell opacification. Clear mastoid air cells. Bilateral cataract extraction. Other: None. IMPRESSION: Unremarkable CT appearance of the brain for age. Electronically Signed   By: Logan Bores M.D.   On: 01/24/2018 17:01   Dg Knee Right Port  Result Date: 01/22/2018 CLINICAL DATA:  Right total knee replacement. EXAM: PORTABLE RIGHT KNEE - 1-2 VIEW COMPARISON:  Right knee MRI dated December 30, 2017. FINDINGS: The right knee demonstrates a total knee arthroplasty without evidence of hardware failure or complication. There is expected intra-articular  air. There is no fracture or dislocation. The alignment is anatomic. Post-surgical changes noted in the surrounding soft tissues. IMPRESSION: Right total knee arthroplasty without evidence of acute postoperative complication. Electronically Signed   By: Orville Govern.D.  On: 01/22/2018 16:21    Assessment/Plan  1. Primary osteoarthritis of right knee -  S/P total knee replacement, follow up with orthopedics, will have outpatient PT   2. COPD, mild (HCC) - albuterol (PROAIR HFA) 108 (90 Base) MCG/ACT inhaler; Inhale 2 puffs into the lungs every 4 (four) hours as needed for wheezing or shortness of breath.  Dispense: 1 Inhaler; Refill: 1 - COMBIVENT RESPIMAT 20-100 MCG/ACT AERS respimat; Inhale 1 puff into the lungs 4 (four) times daily.  Dispense: 4 g; Refill: 0 - ipratropium-albuterol (DUONEB) 0.5-2.5 (3) MG/3ML SOLN; Inhale 3 mLs into the lungs 4 (four) times daily as needed.  Dispense: 360 mL; Refill: 0  3. Acquired hypothyroidism Lab Results  Component Value Date   TSH 3.042 10/16/2017   - levothyroxine (SYNTHROID, LEVOTHROID) 88 MCG tablet; Take 1 tablet (88 mcg total) by mouth daily before breakfast.  Dispense: 30 tablet; Refill: 0  4. Benign essential hypertension -  Her diastolic BPs were in the 12X, and SBPs  128, 116, 117, 95, will discontinue Amlodipine 2.5 mg daily, advised patient to follow-up with PCP and to check BP before taking medications - hydrochlorothiazide (HYDRODIURIL) 12.5 MG tablet; Take 1 tablet (12.5 mg total) by mouth daily.  Dispense: 30 tablet; Refill: 0 - metoprolol tartrate (LOPRESSOR) 50 MG tablet; Take 1 tablet (50 mg total) by mouth daily.  Dispense: 30 tablet; Refill: 0  5. Anxiety - ALPRAZolam (XANAX) 0.5 MG tablet; Take 1 tablet (0.5 mg total) by mouth 2 (two) times daily as needed for anxiety.    6. Major depression, chronic - PARoxetine (PAXIL) 40 MG tablet; Take 1 tablet (40 mg total) by mouth every morning.  Dispense: 30 tablet; Refill:  0     I have filled out patient's discharge paperwork and written prescriptions.  Patient will have outpatient PT.  DME provided:  Rolling walker   Total discharge time: Greater than 30 minutes Greater than 50% was spent in counseling and coordination of care.    Discharge time involved coordination of the discharge process with Education officer, museum and nursing staff./DME verified.    Durenda Age, NP [facility] [phone] (Monday-Friday 8:00 a.m. - 5:00 p.m.) [phone] (after hours)

## 2018-04-02 ENCOUNTER — Inpatient Hospital Stay: Payer: Medicare Other | Admitting: Hematology and Oncology

## 2018-04-02 ENCOUNTER — Inpatient Hospital Stay: Payer: Medicare Other | Attending: Hematology and Oncology

## 2018-04-02 NOTE — Progress Notes (Deleted)
[facility] day:  04/02/2018   Chief Complaint: Rose Irwin is a 84 y.o. female with a history of thyroid carcinoma and breast cancer who is seen for 3 month assessment.  HPI: The patient was last seen in the medical oncology clinic on 01/01/2018.  At that time, she had issues with her right knee and back.  She was scheduled for knee surgery on 01/22/2018.  Exam was stable.  She was planning to relocate to [facility], [place].  She underwent right total knee arthroplasty by Dr. Kurtis Bushman on 01/22/2018.  Hospitalization was complicated by confusion attributed to pain medications.  She was discharged on 01/25/2018 to [facility].  During the interim,   Past Medical History:  Diagnosis Date  . Anxiety   . Arthritis   . Breast cancer ([place]) 11/21/2011   right breast cancer - radiation  . Bronchitis   . Cancer ([place]) 1999    thyroid  . Complication of anesthesia    smothering  . COPD (chronic obstructive pulmonary disease) ([place])   . Depression   . Elevated lipids   . Family history of adverse reaction to anesthesia    sister PONV  . GERD (gastroesophageal reflux disease)   . Hemorrhoids   . History of kidney stones   . Hypertension   . Hypothyroidism   . Neuropathy    rt leg  . Osteopenia   . Personal history of radiation therapy 2013   right breast ca  . Skin cancer     Past Surgical History:  Procedure Laterality Date  . ABDOMINAL HYSTERECTOMY    . BACK SURGERY     lumbar  . bladder polyps    . BREAST BIOPSY Right 10/2011   invasive mammary carcinoma  . BREAST BIOPSY Right yrs ago   benign  . BREAST EXCISIONAL BIOPSY Left 1961, 1970, 1990   benign  . BREAST LUMPECTOMY Right 11/2011   invasive mammary carcinoma, clear margins, negative LN  . BREAST SURGERY Right   . CYSTOSCOPY W/ RETROGRADES Bilateral 02/09/2015   Procedure: CYSTOSCOPY WITH RETROGRADE PYELOGRAM;  Surgeon: Hollice Espy, MD;  Location: [facility] ORS;   Service: Urology;  Laterality: Bilateral;  . LUNG SURGERY    . THYROID SURGERY    . TOTAL KNEE ARTHROPLASTY Right 01/22/2018   Procedure: TOTAL KNEE ARTHROPLASTY;  Surgeon: Lovell Sheehan, MD;  Location: [facility] ORS;  Service: Orthopedics;  Laterality: Right;    Family History  Problem Relation Age of Onset  . Breast cancer Mother        >50  . Breast cancer Sister        >50  . Breast cancer Sister        >50    Social History:  reports that she has been smoking cigarettes. She has a 20.00 pack-year smoking history. She has never used smokeless tobacco. She reports that she does not drink alcohol or use drugs.  She stopped smoking in 01/16/17. Her husband died. She plans to move to [facility] where her son lives.  The patient is accompanied by her sister, Rose Irwin, today.  Allergies:  Allergies  Allergen Reactions  . Prednisone Rash  . Sulfa Antibiotics Anaphylaxis  . Codeine Other (See Comments)    Go limp, sweat Low energy, unable to function.   . Cortisone Other (See Comments)    Body becomes red and gets very hot and burns like sunburn  . Aspirin Palpitations    Current  Medications: Current Outpatient Medications  Medication Sig Dispense Refill  . acetaminophen (TYLENOL) 325 MG tablet Take 1-2 tablets (325-650 mg total) by mouth every 6 (six) hours as needed for mild pain (pain score 1-3 or temp > 100.5). 60 tablet 0  . albuterol (PROAIR HFA) 108 (90 Base) MCG/ACT inhaler Inhale 2 puffs into the lungs every 4 (four) hours as needed for wheezing or shortness of breath. 1 Inhaler 1  . ALPRAZolam (XANAX) 0.5 MG tablet Take 1 tablet (0.5 mg total) by mouth 2 (two) times daily as needed for anxiety. 30 tablet 0  . aspirin 81 MG chewable tablet Chew 1 tablet (81 mg total) by mouth 2 (two) times daily. 60 tablet 0  . COMBIVENT RESPIMAT 20-100 MCG/ACT AERS respimat Inhale 1 puff into the lungs 4 (four) times daily. 4 g 0  . docusate sodium (COLACE) 100 MG capsule Take 1 capsule (100  mg total) by mouth 2 (two) times daily. 60 capsule 0  . hydrochlorothiazide (HYDRODIURIL) 12.5 MG tablet Take 1 tablet (12.5 mg total) by mouth daily. 30 tablet 0  . ipratropium-albuterol (DUONEB) 0.5-2.5 (3) MG/3ML SOLN Inhale 3 mLs into the lungs 4 (four) times daily as needed. 360 mL 0  . levothyroxine (SYNTHROID, LEVOTHROID) 88 MCG tablet Take 1 tablet (88 mcg total) by mouth daily before breakfast. 30 tablet 0  . metoprolol tartrate (LOPRESSOR) 50 MG tablet Take 1 tablet (50 mg total) by mouth daily. 30 tablet 0  . PARoxetine (PAXIL) 40 MG tablet Take 1 tablet (40 mg total) by mouth every morning. 30 tablet 0   No current facility-administered medications for this visit.     Review of Systems:  GENERAL:  Feels good.  No fevers or sweats.  Weight up 1 pound. PERFORMANCE STATUS (ECOG):  1 HEENT:  No visual changes, runny nose, sore throat, mouth sores or tenderness. Lungs: No shortness of breath or cough.  No hemoptysis.  On oxygen 2 liters/min at night. Cardiac:  No chest pain, palpitations, orthopnea, or PND. GI:  No nausea, vomiting, diarrhea, constipation, melena or hematochezia. GU:  No urgency, frequency, dysuria, or hematuria. Musculoskeletal:  Chronic back pain (recent MRI).  Right knee issues (surgery planned 01/22/2018).  No muscle tenderness. Extremities:  No pain or swelling. Skin:  No rashes or skin changes. Neuro:  Sciatic nerves issues on right with numbness in foot.  No headache, weakness, balance or coordination issues. Endocrine:  No diabetes.  h/o thyroid cancer.  No hot flashes or night sweats. Psych:  No mood changes, depression or anxiety. Pain:  No focal pain. Review of systems:  All other systems reviewed and found to be negative.    Physical Exam: There were no vitals taken for this visit. GENERAL:  Thin elderly woman sitting comfortably in the exam room in no acute distress.  She has a cane at her side. MENTAL STATUS:  Alert and oriented to person, place  and time. HEAD:  Pearline Cables hair.  Normocephalic, atraumatic, face symmetric, no Cushingoid features. EYES:  Brown eyes.  Pupils equal round and reactive to light and accomodation.  No conjunctivitis or scleral icterus. ENT:  Oropharynx clear without lesion.  Tongue normal. Mucous membranes moist.  RESPIRATORY:  Clear to auscultation without rales, wheezes or rhonchi. CARDIOVASCULAR:  Regular rate and rhythm without murmur, rub or gallop. BREAST:  Right breast with post-operative changes and significant fibrocystic changes in the upper outer quadrant.  No masses, skin changes or nipple discharge.  Left breast inferior medial fibrocystic changes.  No masses, skin changes or nipple discharge. ABDOMEN:  Soft, non-tender, with active bowel sounds, and no hepatosplenomegaly.  No masses. SKIN:  No rashes, ulcers or lesions. EXTREMITIES: No edema, no skin discoloration or tenderness.  No palpable cords. LYMPH NODES: No palpable cervical, supraclavicular, axillary or inguinal adenopathy  NEUROLOGICAL: Unremarkable. PSYCH:  Appropriate.    No visits with results within 3 Day(s) from this visit.  Latest known visit with results is:  Admission on 01/22/2018, Discharged on 01/25/2018  Component Date Value Ref Range Status  . ABO/RH(D) 01/22/2018    Final                   Value:O NEG Performed at [facility], [address] [place], [place] [address]   . WBC 01/23/2018 7.2  3.6 - 11.0 K/uL Final  . RBC 01/23/2018 3.79* 3.80 - 5.20 MIL/uL Final  . Hemoglobin 01/23/2018 10.8* 12.0 - 16.0 g/dL Final  . HCT 01/23/2018 31.8* 35.0 - 47.0 % Final  . MCV 01/23/2018 83.9  80.0 - 100.0 fL Final  . MCH 01/23/2018 28.4  26.0 - 34.0 pg Final  . MCHC 01/23/2018 33.8  32.0 - 36.0 g/dL Final  . RDW 01/23/2018 13.5  11.5 - 14.5 % Final  . Platelets 01/23/2018 174  150 - 440 K/uL Final   Performed at [facility], [address], [place], [place] [address]  . Sodium 01/23/2018 139  135 - 145  mmol/L Final  . Potassium 01/23/2018 3.8  3.5 - 5.1 mmol/L Final  . Chloride 01/23/2018 95* 98 - 111 mmol/L Final   Please note change in reference range.  . CO2 01/23/2018 36* 22 - 32 mmol/L Final  . Glucose, Bld 01/23/2018 130* 70 - 99 mg/dL Final   Please note change in reference range.  . BUN 01/23/2018 16  8 - 23 mg/dL Final   Please note change in reference range.  . Creatinine, Ser 01/23/2018 0.65  0.44 - 1.00 mg/dL Final  . Calcium 01/23/2018 8.6* 8.9 - 10.3 mg/dL Final  . GFR calc non Af Amer 01/23/2018 >60  >60 mL/min Final  . GFR calc Af Amer 01/23/2018 >60  >60 mL/min Final   Comment: (NOTE) The eGFR has been calculated using the CKD EPI equation. This calculation has not been validated in all clinical situations. eGFR's persistently <60 mL/min signify possible Chronic Kidney Disease.   Georgiann Hahn gap 01/23/2018 8  5 - 15 Final   Performed at [facility], [address] [place], [place] [address]  . WBC 01/24/2018 9.4  3.6 - 11.0 K/uL Final  . RBC 01/24/2018 3.68* 3.80 - 5.20 MIL/uL Final  . Hemoglobin 01/24/2018 10.5* 12.0 - 16.0 g/dL Final  . HCT 01/24/2018 30.8* 35.0 - 47.0 % Final  . MCV 01/24/2018 83.8  80.0 - 100.0 fL Final  . MCH 01/24/2018 28.5  26.0 - 34.0 pg Final  . MCHC 01/24/2018 34.0  32.0 - 36.0 g/dL Final  . RDW 01/24/2018 13.0  11.5 - 14.5 % Final  . Platelets 01/24/2018 173  150 - 440 K/uL Final   Performed at [facility], [address], [place], [place] [address]  . Color, Urine 01/24/2018 YELLOW* YELLOW Final  . APPearance 01/24/2018 CLEAR* CLEAR Final  . Specific Gravity, Urine 01/24/2018 1.008  1.005 - 1.030 Final  . pH 01/24/2018 7.0  5.0 - 8.0 Final  . Glucose, UA 01/24/2018 NEGATIVE  NEGATIVE mg/dL Final  . Hgb urine dipstick 01/24/2018 NEGATIVE  NEGATIVE Final  . Bilirubin Urine 01/24/2018 NEGATIVE  NEGATIVE Final  . Ketones, ur 01/24/2018 NEGATIVE  NEGATIVE mg/dL Final  . Protein, ur 01/24/2018 NEGATIVE  NEGATIVE mg/dL  Final  . Nitrite 01/24/2018 NEGATIVE  NEGATIVE Final  . Leukocytes, UA 01/24/2018 NEGATIVE  NEGATIVE Final  . RBC / HPF 01/24/2018 0-5  0 - 5 RBC/hpf Final  . WBC, UA 01/24/2018 0-5  0 - 5 WBC/hpf Final  . Bacteria, UA 01/24/2018 NONE SEEN  NONE SEEN Final  . Squamous Epithelial / LPF 01/24/2018 0-5  0 - 5 Final  . Mucus 01/24/2018 PRESENT   Final   Performed at [facility], [address] [place], [place] [address]  . WBC 01/25/2018 10.1  3.6 - 11.0 K/uL Final  . RBC 01/25/2018 3.26* 3.80 - 5.20 MIL/uL Final  . Hemoglobin 01/25/2018 9.4* 12.0 - 16.0 g/dL Final  . HCT 01/25/2018 27.3* 35.0 - 47.0 % Final  . MCV 01/25/2018 83.9  80.0 - 100.0 fL Final  . MCH 01/25/2018 28.8  26.0 - 34.0 pg Final  . MCHC 01/25/2018 34.3  32.0 - 36.0 g/dL Final  . RDW 01/25/2018 13.3  11.5 - 14.5 % Final  . Platelets 01/25/2018 167  150 - 440 K/uL Final   Performed at [facility], [address], [place], [place] [address]  . Sodium 01/25/2018 135  135 - 145 mmol/L Final  . Potassium 01/25/2018 3.8  3.5 - 5.1 mmol/L Final  . Chloride 01/25/2018 94* 98 - 111 mmol/L Final   Please note change in reference range.  . CO2 01/25/2018 34* 22 - 32 mmol/L Final  . Glucose, Bld 01/25/2018 119* 70 - 99 mg/dL Final   Please note change in reference range.  . BUN 01/25/2018 15  8 - 23 mg/dL Final   Please note change in reference range.  . Creatinine, Ser 01/25/2018 0.67  0.44 - 1.00 mg/dL Final  . Calcium 01/25/2018 9.0  8.9 - 10.3 mg/dL Final  . Total Protein 01/25/2018 5.8* 6.5 - 8.1 g/dL Final  . Albumin 01/25/2018 3.0* 3.5 - 5.0 g/dL Final  . AST 01/25/2018 24  15 - 41 U/L Final  . ALT 01/25/2018 10  0 - 44 U/L Final   Please note change in reference range.  . Alkaline Phosphatase 01/25/2018 67  38 - 126 U/L Final  . Total Bilirubin 01/25/2018 1.0  0.3 - 1.2 mg/dL Final  . GFR calc non Af Amer 01/25/2018 >60  >60 mL/min Final  . GFR calc Af Amer 01/25/2018 >60  >60 mL/min Final    Comment: (NOTE) The eGFR has been calculated using the CKD EPI equation. This calculation has not been validated in all clinical situations. eGFR's persistently <60 mL/min signify possible Chronic Kidney Disease.   Georgiann Hahn gap 01/25/2018 7  5 - 15 Final   Performed at [facility], [address] [place], [place] [address]  . Ammonia 01/25/2018 22  9 - 35 umol/L Final   Performed at [facility], [address] [place], [place] [address]    Assessment:  TAREKA JHAVERI is a 84 y.o. female with a history of stage IB right breast cancer (2013) and thyroid cancer (1999).  She presented with neck fullness.  She underwent thyrodectomy and I-131.  She has a substernal goiter.  Chest CT on 08/01/2011 revealed a 3.8 x 3.9 x 5.7 cm mass in the superior mediastinum lying posterior to the trachea and displacing the esophagus toward the  right along its upper portion.  More distally the thoracic esophagus appears normal. Decision was made for observation  She was diagnosed with right breast cancer in 10/2011.  Pathology revealed a [id number] tumor which was ER positive, PR posiitive and her2/neu negative.  She underwent lumpectomy followed by radiation.  She began Arimidex after radiation.  She discontinued Arimidex in 2016.  She declined further hormonal therapy.    Noncontrast chest CT on 10/21/2017 revealed a low-attenuation mass posterior to the trachea that likely arises from the left lobe of the thyroid measuring 3.3 x 4.6 cm, with an internal thin calcified septation. The esophagus was displaced to the right.  Findings were stable as compared to prior exams.  Mediastinal lymph nodes were not enlarged by CT criteria.  Hilar regions were difficult to evaluate in the absence of IV contrast.  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  Enlarged pulmonary arteries were consistent with pulmonary arterial  hypertension.  Mammogram on 12/31/2017 revealed no evidence of malignancy in either breast.  There were stable right breast posttreatment changes.  CA27.29 has been followed:  27.6 on 06/13/2012, 35.6 on 05/04/2015, 33.4 on 11/02/2015, 30.2 on 03/14/2016, 28.1 on 11/21/2016, 33.4 on 04/17/2017, 43.4 on 10/16/2017, and 32.1 on 11/27/2017.  She has a history of esophageal dilatation x 2 (last 15 years ago).  Chest CT on 11/09/2015 revealed a 3.7 x 4.0 mediastinal mass posterior to the mid trachea which was stable in size from 08/01/2011.  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 It exerted significant mass effect on the upper esophagus.    Barium swallow on 11/21/2015 revealed deviation of the cervical esophagus to the right from previously identified paratracheal mass lesion.  There were no motility issues with her esophagus.   I-131 scan with Thyrogen on 12/03/2015 revealed a large focus of intense radiotracer uptake within the superior mediastinum corresponding to the mediastinal mass identified on chest CT and compatible with residual functioning thyroid tissue (recurrent thyroid cancer or normal thyroid tissue).  She is not a surgical candidate.  Symptomatically, she has issues with her right knee and back.  She is scheduled for knee surgery on 01/22/2018.  Exam is stable.  She is unsure when she is going to relocate to [facility], [place].  Plan: 1.  Labs today:  CBC with diff, CMP, CA27.29, TSH, free T4.   2.  Review interval mammogram- no evidence of malignancy. 3.  RTC in 3 months for MD assessment and labs (CBC with diff, CMP, CA27.29, TSH, free T4).   Rose Asal, MD  04/02/2018, 4:47 AM

## 2018-05-14 ENCOUNTER — Other Ambulatory Visit: Payer: Medicare Other

## 2018-05-14 ENCOUNTER — Inpatient Hospital Stay: Payer: Medicare Other | Attending: Hematology and Oncology

## 2018-05-14 ENCOUNTER — Ambulatory Visit: Payer: Medicare Other | Admitting: Hematology and Oncology

## 2018-05-14 DIAGNOSIS — C50911 Malignant neoplasm of unspecified site of right female breast: Secondary | ICD-10-CM

## 2018-05-14 DIAGNOSIS — C73 Malignant neoplasm of thyroid gland: Secondary | ICD-10-CM

## 2018-05-14 DIAGNOSIS — Z85850 Personal history of malignant neoplasm of thyroid: Secondary | ICD-10-CM | POA: Diagnosis present

## 2018-05-14 DIAGNOSIS — Z853 Personal history of malignant neoplasm of breast: Secondary | ICD-10-CM | POA: Insufficient documentation

## 2018-05-14 DIAGNOSIS — Z170 Estrogen receptor positive status [ER+]: Secondary | ICD-10-CM

## 2018-05-14 LAB — COMPREHENSIVE METABOLIC PANEL
ALT: 12 U/L (ref 0–44)
ANION GAP: 8 (ref 5–15)
AST: 22 U/L (ref 15–41)
Albumin: 3.8 g/dL (ref 3.5–5.0)
Alkaline Phosphatase: 85 U/L (ref 38–126)
BUN: 13 mg/dL (ref 8–23)
CHLORIDE: 93 mmol/L — AB (ref 98–111)
CO2: 35 mmol/L — AB (ref 22–32)
CREATININE: 0.74 mg/dL (ref 0.44–1.00)
Calcium: 9.6 mg/dL (ref 8.9–10.3)
GFR calc non Af Amer: >60 mL/min (ref >60)
Glucose, Bld: 105 mg/dL — ABNORMAL HIGH (ref 70–99)
Potassium: 3.7 mmol/L (ref 3.5–5.1)
Sodium: 136 mmol/L (ref 135–145)
Total Bilirubin: 0.6 mg/dL (ref 0.3–1.2)
Total Protein: 6.9 g/dL (ref 6.5–8.1)

## 2018-05-14 LAB — CBC WITH DIFFERENTIAL/PLATELET
ABS IMMATURE GRANULOCYTES: 0.02 10*3/uL (ref 0.00–0.07)
Basophils Absolute: 0 10*3/uL (ref 0.0–0.1)
Basophils Relative: 1 %
Eosinophils Absolute: 0.2 10*3/uL (ref 0.0–0.5)
Eosinophils Relative: 3 %
HCT: 38.6 % (ref 36.0–46.0)
Hemoglobin: 12.2 g/dL (ref 12.0–15.0)
Immature Granulocytes: 0 %
Lymphocytes Relative: 16 %
Lymphs Abs: 1 10*3/uL (ref 0.7–4.0)
MCH: 26.4 pg (ref 26.0–34.0)
MCHC: 31.6 g/dL (ref 30.0–36.0)
MCV: 83.5 fL (ref 80.0–100.0)
MONO ABS: 0.6 10*3/uL (ref 0.1–1.0)
Monocytes Relative: 9 %
NEUTROS ABS: 4.5 10*3/uL (ref 1.7–7.7)
NEUTROS PCT: 71 %
PLATELETS: 181 10*3/uL (ref 150–400)
RBC: 4.62 MIL/uL (ref 3.87–5.11)
RDW: 13.4 % (ref 11.5–15.5)
WBC: 6.3 10*3/uL (ref 4.0–10.5)
nRBC: 0 % (ref 0.0–0.2)

## 2018-05-14 LAB — TSH: TSH: 1.671 u[IU]/mL (ref 0.350–4.500)

## 2018-05-14 LAB — T4, FREE: Free T4: 1.1 ng/dL (ref 0.82–1.77)

## 2018-05-15 LAB — CANCER ANTIGEN 27.29: CA 27.29: 36.1 U/mL (ref 0.0–38.6)

## 2018-05-21 ENCOUNTER — Other Ambulatory Visit: Payer: Medicare Other

## 2018-05-21 ENCOUNTER — Ambulatory Visit: Payer: Medicare Other | Admitting: Hematology and Oncology

## 2018-07-01 ENCOUNTER — Other Ambulatory Visit: Payer: Self-pay

## 2018-07-01 DIAGNOSIS — C73 Malignant neoplasm of thyroid gland: Secondary | ICD-10-CM

## 2018-07-09 ENCOUNTER — Inpatient Hospital Stay: Payer: Medicare Other | Attending: Hematology and Oncology | Admitting: Hematology and Oncology

## 2018-07-09 ENCOUNTER — Inpatient Hospital Stay: Payer: Medicare Other

## 2018-07-09 NOTE — Progress Notes (Deleted)
[facility] day:  07/09/2018   Chief Complaint: Rose Irwin is a 84 y.o. female with a history of thyroid carcinoma and breast cancer who is seen for 6 month assessment.  HPI: The patient was last seen in the medical oncology clinic on 01/01/2018.  At that time, she had issues with her right knee and back.  She wass scheduled for knee surgery on 01/22/2018.  Exam was stable.  She was unsure when she is going to relocate to [facility], [place].  She underwent total right knee arthroplasty on 01/22/2018 by Dr. Kurtis Bushman.  During the interim,   Past Medical History:  Diagnosis Date  . Anxiety   . Arthritis   . Breast cancer ([place]) 11/21/2011   right breast cancer - radiation  . Bronchitis   . Cancer ([place]) 1999    thyroid  . Complication of anesthesia    smothering  . COPD (chronic obstructive pulmonary disease) ([place])   . Depression   . Elevated lipids   . Family history of adverse reaction to anesthesia    sister PONV  . GERD (gastroesophageal reflux disease)   . Hemorrhoids   . History of kidney stones   . Hypertension   . Hypothyroidism   . Neuropathy    rt leg  . Osteopenia   . Personal history of radiation therapy 2013   right breast ca  . Skin cancer     Past Surgical History:  Procedure Laterality Date  . ABDOMINAL HYSTERECTOMY    . BACK SURGERY     lumbar  . bladder polyps    . BREAST BIOPSY Right 10/2011   invasive mammary carcinoma  . BREAST BIOPSY Right yrs ago   benign  . BREAST EXCISIONAL BIOPSY Left 1961, 1970, 1990   benign  . BREAST LUMPECTOMY Right 11/2011   invasive mammary carcinoma, clear margins, negative LN  . BREAST SURGERY Right   . CYSTOSCOPY W/ RETROGRADES Bilateral 02/09/2015   Procedure: CYSTOSCOPY WITH RETROGRADE PYELOGRAM;  Surgeon: Hollice Espy, MD;  Location: [facility] ORS;  Service: Urology;  Laterality: Bilateral;  . LUNG SURGERY    . THYROID SURGERY    . TOTAL KNEE ARTHROPLASTY  Right 01/22/2018   Procedure: TOTAL KNEE ARTHROPLASTY;  Surgeon: Lovell Sheehan, MD;  Location: [facility] ORS;  Service: Orthopedics;  Laterality: Right;    Family History  Problem Relation Age of Onset  . Breast cancer Mother        >50  . Breast cancer Sister        >50  . Breast cancer Sister        >50    Social History:  reports that she has been smoking cigarettes. She has a 20.00 pack-year smoking history. She has never used smokeless tobacco. She reports that she does not drink alcohol or use drugs.  She stopped smoking in January 24, 2017. Her husband died. She plans to move to [facility] where her son lives.  The patient is accompanied by her sister, Apolonio Schneiders, today.  Allergies:  Allergies  Allergen Reactions  . Prednisone Rash  . Sulfa Antibiotics Anaphylaxis  . Codeine Other (See Comments)    Go limp, sweat Low energy, unable to function.   . Cortisone Other (See Comments)    Body becomes red and gets very hot and burns like sunburn  . Aspirin Palpitations    Current Medications: Current Outpatient Medications  Medication Sig Dispense Refill  . acetaminophen (TYLENOL) 325 MG  tablet Take 1-2 tablets (325-650 mg total) by mouth every 6 (six) hours as needed for mild pain (pain score 1-3 or temp > 100.5). 60 tablet 0  . albuterol (PROAIR HFA) 108 (90 Base) MCG/ACT inhaler Inhale 2 puffs into the lungs every 4 (four) hours as needed for wheezing or shortness of breath. 1 Inhaler 1  . ALPRAZolam (XANAX) 0.5 MG tablet Take 1 tablet (0.5 mg total) by mouth 2 (two) times daily as needed for anxiety. 30 tablet 0  . aspirin 81 MG chewable tablet Chew 1 tablet (81 mg total) by mouth 2 (two) times daily. 60 tablet 0  . COMBIVENT RESPIMAT 20-100 MCG/ACT AERS respimat Inhale 1 puff into the lungs 4 (four) times daily. 4 g 0  . docusate sodium (COLACE) 100 MG capsule Take 1 capsule (100 mg total) by mouth 2 (two) times daily. 60 capsule 0  . hydrochlorothiazide (HYDRODIURIL) 12.5 MG tablet Take  1 tablet (12.5 mg total) by mouth daily. 30 tablet 0  . ipratropium-albuterol (DUONEB) 0.5-2.5 (3) MG/3ML SOLN Inhale 3 mLs into the lungs 4 (four) times daily as needed. 360 mL 0  . levothyroxine (SYNTHROID, LEVOTHROID) 88 MCG tablet Take 1 tablet (88 mcg total) by mouth daily before breakfast. 30 tablet 0  . metoprolol tartrate (LOPRESSOR) 50 MG tablet Take 1 tablet (50 mg total) by mouth daily. 30 tablet 0  . PARoxetine (PAXIL) 40 MG tablet Take 1 tablet (40 mg total) by mouth every morning. 30 tablet 0   No current facility-administered medications for this visit.     Review of Systems:  GENERAL:  Feels good.  No fevers or sweats.  Weight up 1 pound. PERFORMANCE STATUS (ECOG):  1 HEENT:  No visual changes, runny nose, sore throat, mouth sores or tenderness. Lungs: No shortness of breath or cough.  No hemoptysis.  On oxygen 2 liters/min at night. Cardiac:  No chest pain, palpitations, orthopnea, or PND. GI:  No nausea, vomiting, diarrhea, constipation, melena or hematochezia. GU:  No urgency, frequency, dysuria, or hematuria. Musculoskeletal:  Chronic back pain (recent MRI).  Right knee issues (surgery planned 01/22/2018).  No muscle tenderness. Extremities:  No pain or swelling. Skin:  No rashes or skin changes. Neuro:  Sciatic nerves issues on right with numbness in foot.  No headache, weakness, balance or coordination issues. Endocrine:  No diabetes.  h/o thyroid cancer.  No hot flashes or night sweats. Psych:  No mood changes, depression or anxiety. Pain:  No focal pain. Review of systems:  All other systems reviewed and found to be negative.    Physical Exam: There were no vitals taken for this visit. GENERAL:  Thin elderly woman sitting comfortably in the exam room in no acute distress.  She has a cane at her side. MENTAL STATUS:  Alert and oriented to person, place and time. HEAD:  Pearline Cables hair.  Normocephalic, atraumatic, face symmetric, no Cushingoid features. EYES:  Brown  eyes.  Pupils equal round and reactive to light and accomodation.  No conjunctivitis or scleral icterus. ENT:  Oropharynx clear without lesion.  Tongue normal. Mucous membranes moist.  RESPIRATORY:  Clear to auscultation without rales, wheezes or rhonchi. CARDIOVASCULAR:  Regular rate and rhythm without murmur, rub or gallop. BREAST:  Right breast with post-operative changes and significant fibrocystic changes in the upper outer quadrant.  No masses, skin changes or nipple discharge.  Left breast inferior medial fibrocystic changes.  No masses, skin changes or nipple discharge. ABDOMEN:  Soft, non-tender, with active bowel  sounds, and no hepatosplenomegaly.  No masses. SKIN:  No rashes, ulcers or lesions. EXTREMITIES: No edema, no skin discoloration or tenderness.  No palpable cords. LYMPH NODES: No palpable cervical, supraclavicular, axillary or inguinal adenopathy  NEUROLOGICAL: Unremarkable. PSYCH:  Appropriate.    No visits with results within 3 Day(s) from this visit.  Latest known visit with results is:  Appointment on 05/14/2018  Component Date Value Ref Range Status  . Free T4 05/14/2018 1.10  0.82 - 1.77 ng/dL Final   Comment: (NOTE) Biotin ingestion may interfere with free T4 tests. If the results are inconsistent with the TSH level, previous test results, or the clinical presentation, then consider biotin interference. If needed, order repeat testing after stopping biotin. Performed at [facility], [address] [place], [place] [address]   . TSH 05/14/2018 1.671  0.350 - 4.500 uIU/mL Final   Comment: Performed by a 3rd Generation assay with a functional sensitivity of <=0.01 uIU/mL. Performed at [facility], [address] [place], [place] [address]   . CA 27.29 05/14/2018 36.1  0.0 - 38.6 U/mL Final   Comment: (NOTE) Siemens Centaur Immunochemiluminometric Methodology [facility]) Values obtained with different assay methods or kits cannot be  used interchangeably. Results cannot be interpreted as absolute evidence of the presence or absence of malignant disease. Performed At: [facility] [place], [place] [id number] Rush Farmer MD [id number]   . WBC 05/14/2018 6.3  4.0 - 10.5 K/uL Final  . RBC 05/14/2018 4.62  3.87 - 5.11 MIL/uL Final  . Hemoglobin 05/14/2018 12.2  12.0 - 15.0 g/dL Final  . HCT 05/14/2018 38.6  36.0 - 46.0 % Final  . MCV 05/14/2018 83.5  80.0 - 100.0 fL Final  . MCH 05/14/2018 26.4  26.0 - 34.0 pg Final  . MCHC 05/14/2018 31.6  30.0 - 36.0 g/dL Final  . RDW 05/14/2018 13.4  11.5 - 15.5 % Final  . Platelets 05/14/2018 181  150 - 400 K/uL Final  . nRBC 05/14/2018 0.0  0.0 - 0.2 % Final  . Neutrophils Relative % 05/14/2018 71  % Final  . Neutro Abs 05/14/2018 4.5  1.7 - 7.7 K/uL Final  . Lymphocytes Relative 05/14/2018 16  % Final  . Lymphs Abs 05/14/2018 1.0  0.7 - 4.0 K/uL Final  . Monocytes Relative 05/14/2018 9  % Final  . Monocytes Absolute 05/14/2018 0.6  0.1 - 1.0 K/uL Final  . Eosinophils Relative 05/14/2018 3  % Final  . Eosinophils Absolute 05/14/2018 0.2  0.0 - 0.5 K/uL Final  . Basophils Relative 05/14/2018 1  % Final  . Basophils Absolute 05/14/2018 0.0  0.0 - 0.1 K/uL Final  . Immature Granulocytes 05/14/2018 0  % Final  . Abs Immature Granulocytes 05/14/2018 0.02  0.00 - 0.07 K/uL Final   Performed at [facility], [address] [place], [place] [address]  . Sodium 05/14/2018 136  135 - 145 mmol/L Final  . Potassium 05/14/2018 3.7  3.5 - 5.1 mmol/L Final  . Chloride 05/14/2018 93* 98 - 111 mmol/L Final  . CO2 05/14/2018 35* 22 - 32 mmol/L Final  . Glucose, Bld 05/14/2018 105* 70 - 99 mg/dL Final  . BUN 05/14/2018 13  8 - 23 mg/dL Final  . Creatinine, Ser 05/14/2018 0.74  0.44 - 1.00 mg/dL Final  . Calcium 05/14/2018 9.6  8.9 - 10.3 mg/dL Final  . Total Protein 05/14/2018 6.9  6.5 - 8.1 g/dL Final  . Albumin 05/14/2018 3.8  3.5 - 5.0 g/dL Final   . AST 05/14/2018 22  15 - 41 U/L Final  . ALT 05/14/2018 12  0 - 44 U/L Final  . Alkaline Phosphatase 05/14/2018 85  38 - 126 U/L Final  . Total Bilirubin 05/14/2018 0.6  0.3 - 1.2 mg/dL Final  . GFR calc non Af Amer 05/14/2018 >60  >60 mL/min Final  . GFR calc Af Amer 05/14/2018 >60  >60 mL/min Final   Comment: (NOTE) The eGFR has been calculated using the CKD EPI equation. This calculation has not been validated in all clinical situations. eGFR's persistently <60 mL/min signify possible Chronic Kidney Disease.   Georgiann Hahn gap 05/14/2018 8  5 - 15 Final   Performed at [facility], [address] [place], [place] [address]    Assessment:  Rose Irwin is a 84 y.o. female with a history of stage IB right breast cancer (2013) and thyroid cancer (1999).  She presented with neck fullness.  She underwent thyrodectomy and I-131.  She has a substernal goiter.  Chest CT on 08/01/2011 revealed a 3.8 x 3.9 x 5.7 cm mass in the superior mediastinum lying posterior to the trachea and displacing the esophagus toward the right along its upper portion.  More distally the thoracic esophagus appears normal. Decision was made for observation  She was diagnosed with right breast cancer in 10/2011.  Pathology revealed a T1bN0M0 tumor which was ER positive, PR posiitive and her2/neu negative.  She underwent lumpectomy followed by radiation.  She began Arimidex after radiation.  She discontinued Arimidex in 2016.  She declined further hormonal therapy.    Noncontrast chest CT on 10/21/2017 revealed a low-attenuation mass posterior to the trachea that likely arises from the left lobe of the thyroid measuring 3.3 x 4.6 cm, with an internal thin calcified septation. The esophagus was displaced to the right.  Findings were stable as compared to prior exams.  Mediastinal lymph nodes were not enlarged by CT criteria.  Hilar regions were difficult to evaluate in the absence of IV contrast.  Basilar  predominant peribronchovascular nodularity and bronchiectasis, similar and likely due to mycobacterium avium complex.  Enlarged pulmonary arteries were consistent with pulmonary arterial hypertension.  Mammogram on 12/31/2017 revealed no evidence of malignancy in either breast.  There were stable right breast posttreatment changes.  CA27.29 has been followed:  27.6 on 06/13/2012, 35.6 on 05/04/2015, 33.4 on 11/02/2015, 30.2 on 03/14/2016, 28.1 on 11/21/2016, 33.4 on 04/17/2017, 43.4 on 10/16/2017, and 32.1 on 11/27/2017.  She has a history of esophageal dilatation x 2 (last 15 years ago).  Chest CT on 11/09/2015 revealed a 3.7 x 4.0 mediastinal mass posterior to the mid trachea which was stable in size from 08/01/2011.  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 It exerted significant mass effect on the upper esophagus.    Barium swallow on 11/21/2015 revealed deviation of the cervical esophagus to the right from previously identified paratracheal mass lesion.  There were no motility issues with her esophagus.   I-131 scan with Thyrogen on 12/03/2015 revealed a large focus of intense radiotracer uptake within the superior mediastinum corresponding to the mediastinal mass identified on chest CT and compatible with residual functioning thyroid tissue (recurrent thyroid cancer or normal thyroid tissue).  She is not a surgical candidate.  Symptomatically,  she has issues with her right knee and back.  She is scheduled for knee surgery on 01/22/2018.  Exam is stable.  She is  unsure when she is going to relocate to [facility], [place].  Plan: 1.  Labs today:  CBC with diff, CMP, CA27.29, TSH, free T4. 2.  Stage IB right breast cancer:  3.  Thyroid cancer:  2.  Review interval mammogram- no evidence of malignancy. 3.  RTC in 3 months for MD assessment and labs (CBC with diff, CMP, CA27.29, TSH, free T4).   Lequita Asal, MD  07/09/2018, 5:33 AM

## 2018-12-12 IMAGING — CT CT CHEST W/O CM
1 series · 15 of 34 positions shown, 19 images · non-contrast
Comparison: 04/25/2017.

CLINICAL DATA: Chest mass.

EXAM:
CT CHEST WITHOUT CONTRAST
TECHNIQUE: Multidetector CT imaging of the chest was performed following the
standard protocol without IV contrast.

[Series 6: thorax · axial · 0.62mm/px · z∈[-290,-30]mm · 15 of 154 slices shown, 19 images]
[im 12/154  mediastinal]
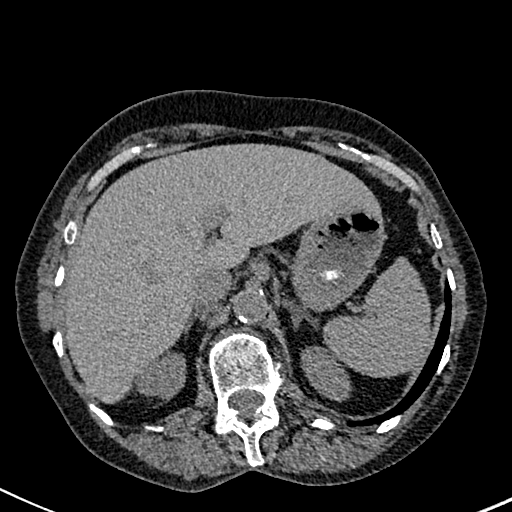
[im 12/154  lung]
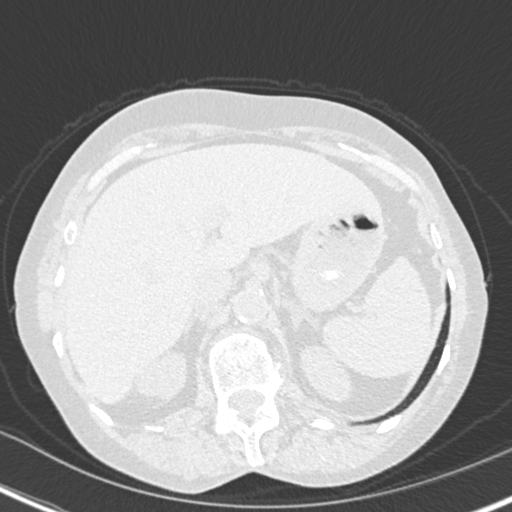
[im 23/154  lung]
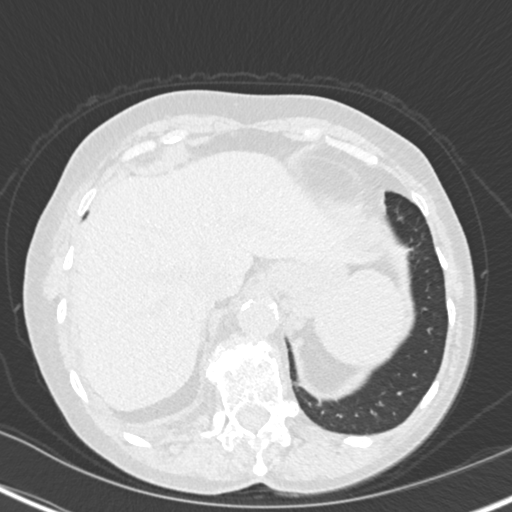
[im 31/154  lung]
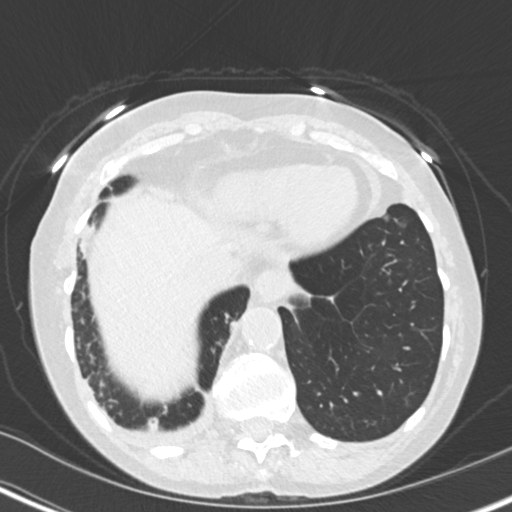
[im 40/154  lung]
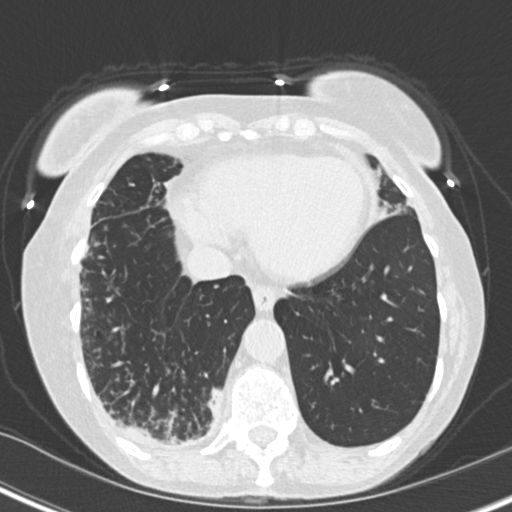
[im 52/154  mediastinal]
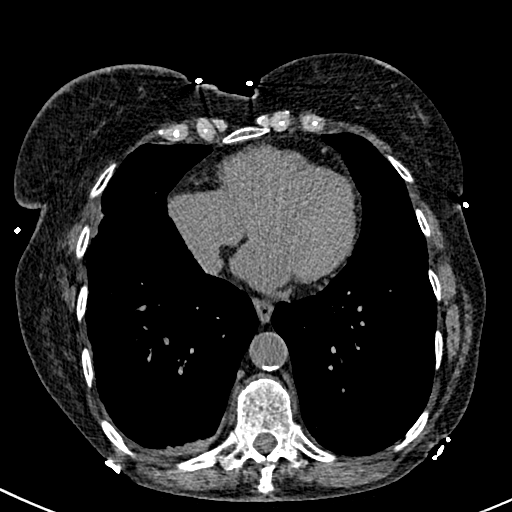
[im 52/154  lung]
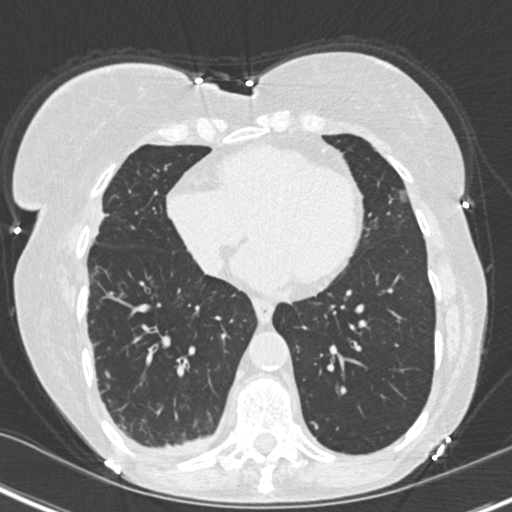
[im 62/154  lung]
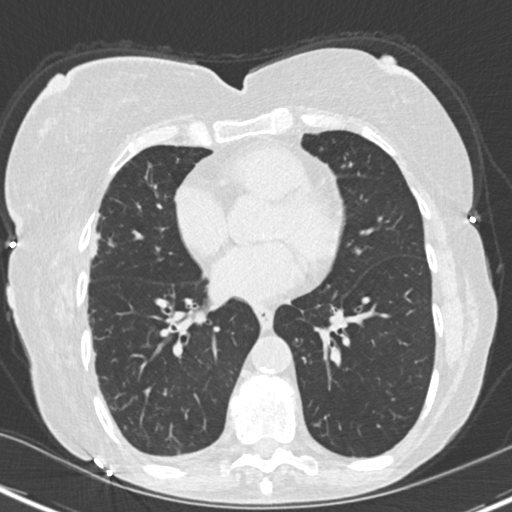
[im 69/154  lung]
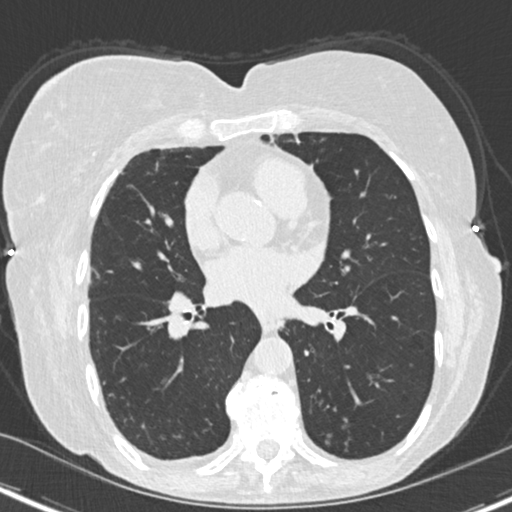
[im 80/154  lung]
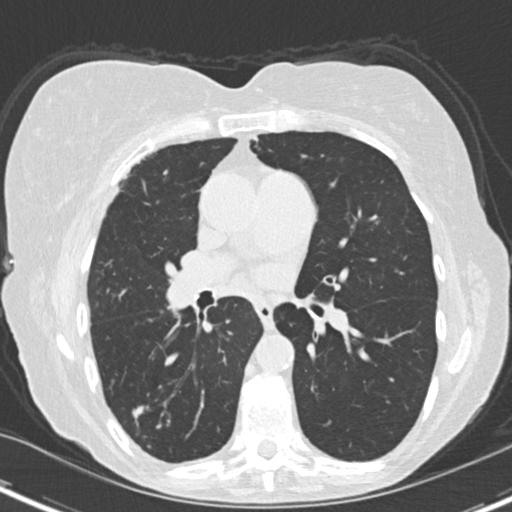
[im 86/154  mediastinal]
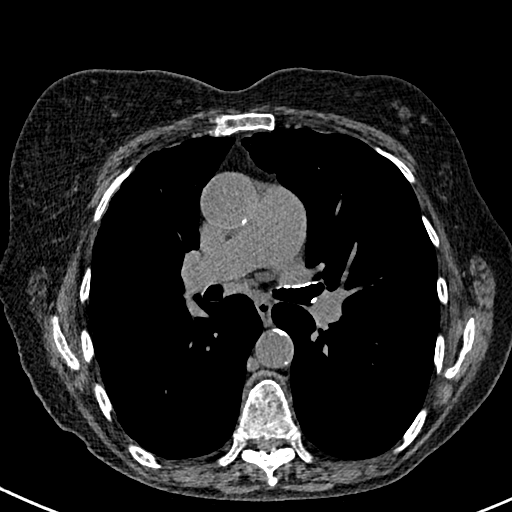
[im 86/154  lung]
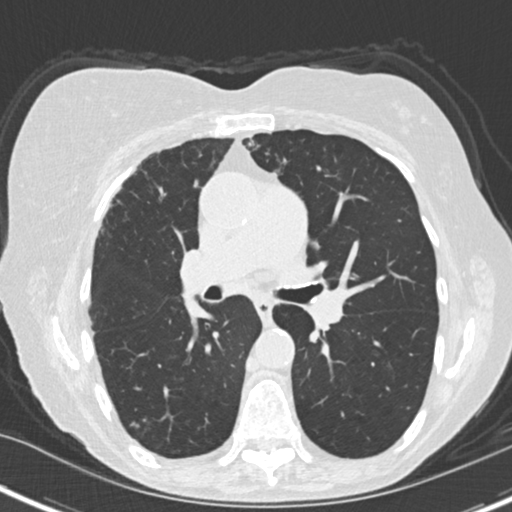
[im 92/154  lung]
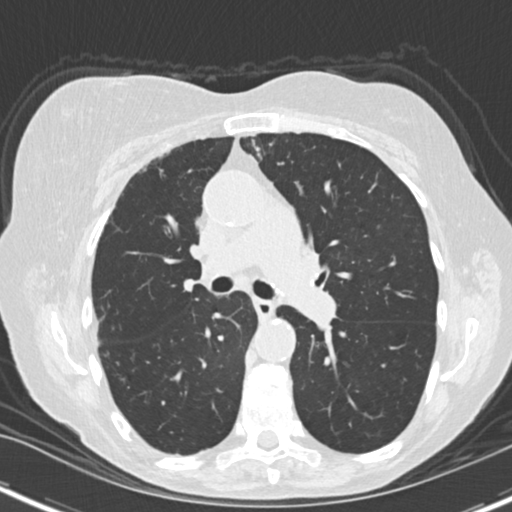
[im 103/154  lung]
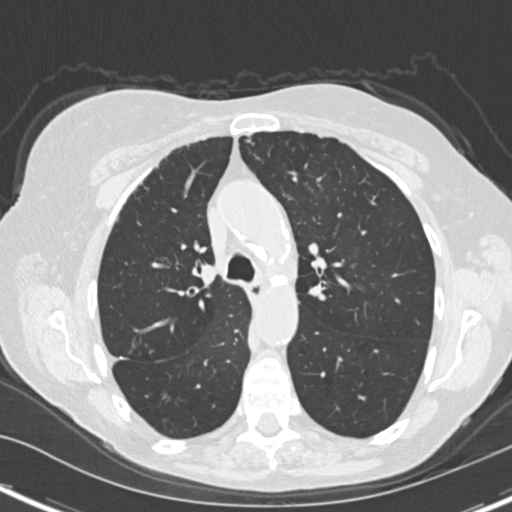
[im 114/154  lung]
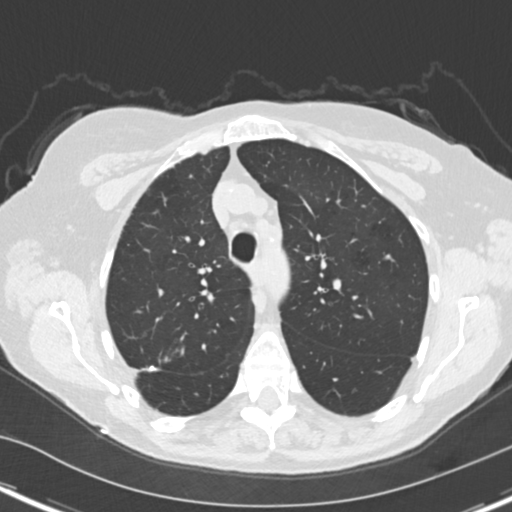
[im 123/154  mediastinal]
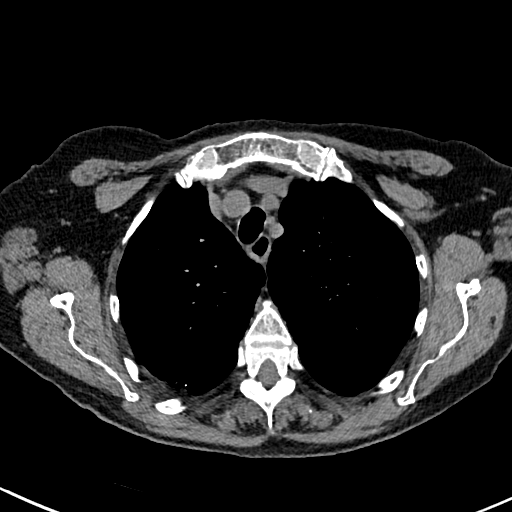
[im 123/154  lung]
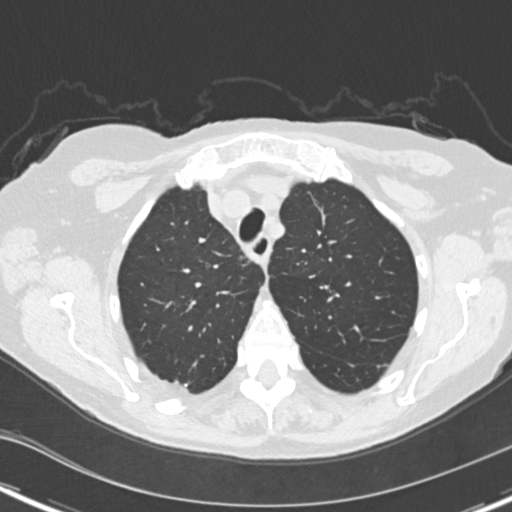
[im 131/154  lung]
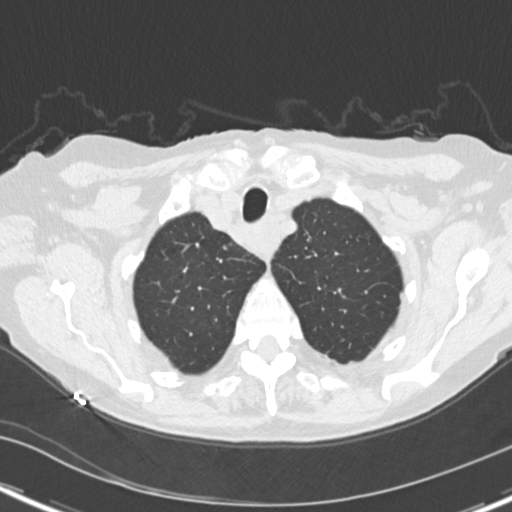
[im 142/154  lung]
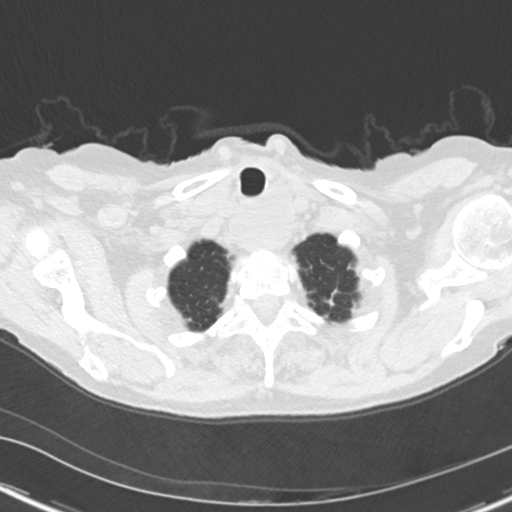

[15 of 34 positions shown; findings below may reference images not displayed]

FINDINGS: Cardiovascular: Atherosclerotic calcification of the arterial
vasculature, including coronary arteries. Pulmonary arteries are
enlarged. Heart is at the upper limits of normal in size. No
pericardial effusion.

Mediastinum/Nodes: A mass posterior to the upper trachea likely
rises from the left lobe of the thyroid and measures 3.3 x 4.6 cm,
as before, with an internal thin calcified septation. There is mass
effect and rightward displacement of the esophagus. Mediastinal
lymph nodes are not enlarged by CT size criteria. Hilar regions are
difficult to evaluate without IV contrast. No axillary adenopathy.
Esophagus is otherwise grossly unremarkable.

Lungs/Pleura: Mild biapical pleuroparenchymal scarring. Mild
centrilobular emphysema. Mild basilar dependent peribronchovascular
nodularity and bronchiectasis, right greater than left, similar.
Postoperative scarring in the posterior segment right upper lobe.
Chronic appearing tiny right pleural effusion and volume loss in the
adjacent right lower lobe. No pleural fluid. Airway is unremarkable.

Upper Abdomen: Visualized portion of the liver is unremarkable.
There may be slight nodular thickening of the adrenal glands.
Visualized portions of the kidneys, spleen, pancreas and stomach are
grossly unremarkable. No upper abdominal adenopathy.

Musculoskeletal: Degenerative changes in the spine. No worrisome
lytic or sclerotic lesions.
IMPRESSION: 1. Low-attenuation mass posterior to the trachea may arise from the
left lobe of the thyroid or esophagus. The esophagus is displaced to
the right. Findings are unchanged from prior exams, indicative of a
benign lesion.
2. Basilar predominant peribronchovascular nodularity and
bronchiectasis, similar and likely due to mycobacterium avium
complex.
3. Aortic atherosclerosis ([id number]-170.0). Coronary artery
calcification.
4. Enlarged pulmonary arteries, indicative of pulmonary arterial
hypertension.

## 2019-03-17 IMAGING — CR DG CHEST 2V
2 series · 2 of 2 positions shown · non-contrast
Comparison: Chest x-ray dated 11/06/2016.

CLINICAL DATA: Confusion.  History of COPD, breast cancer, smoker.

EXAM:
CHEST - 2 VIEW

[chest lat]
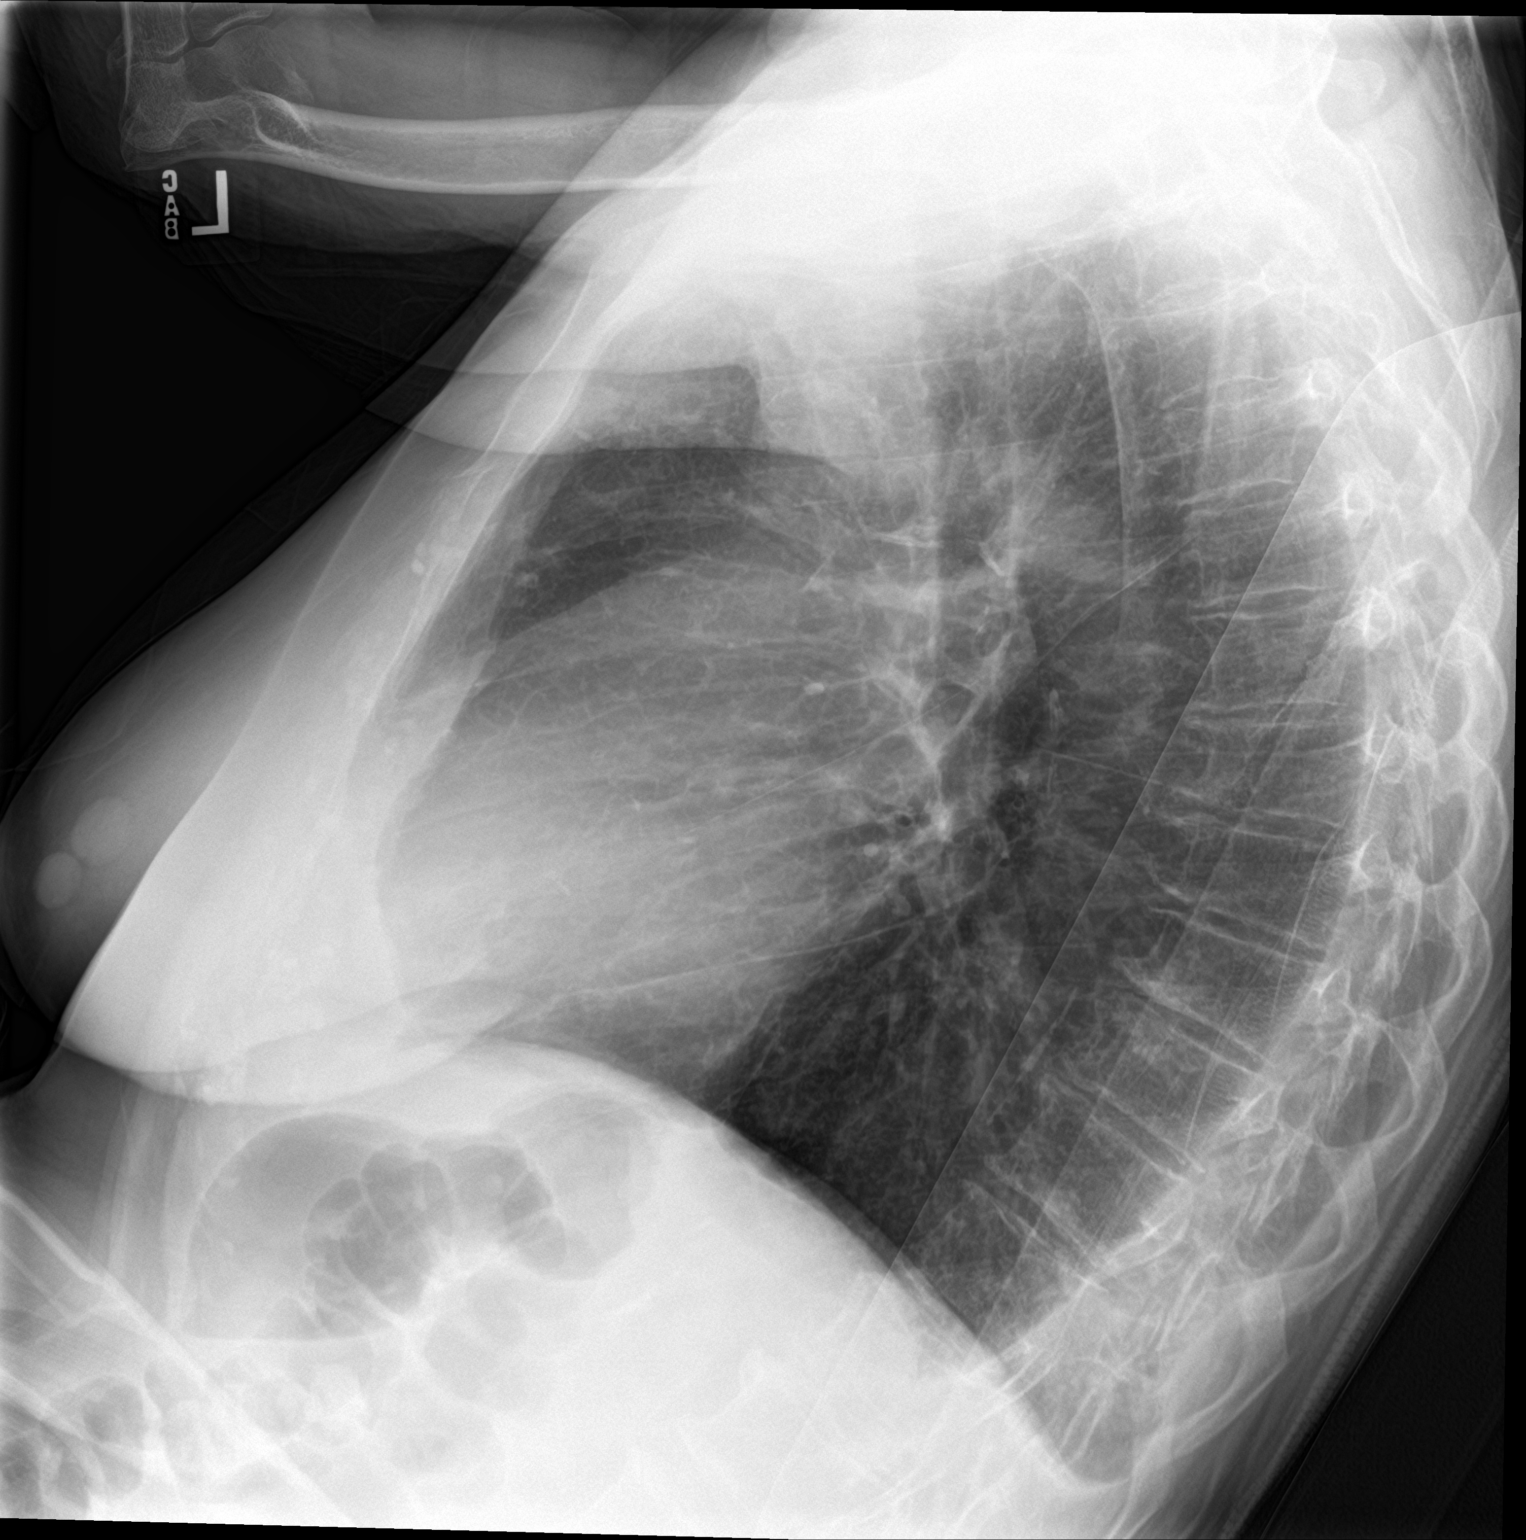

[chest ap]
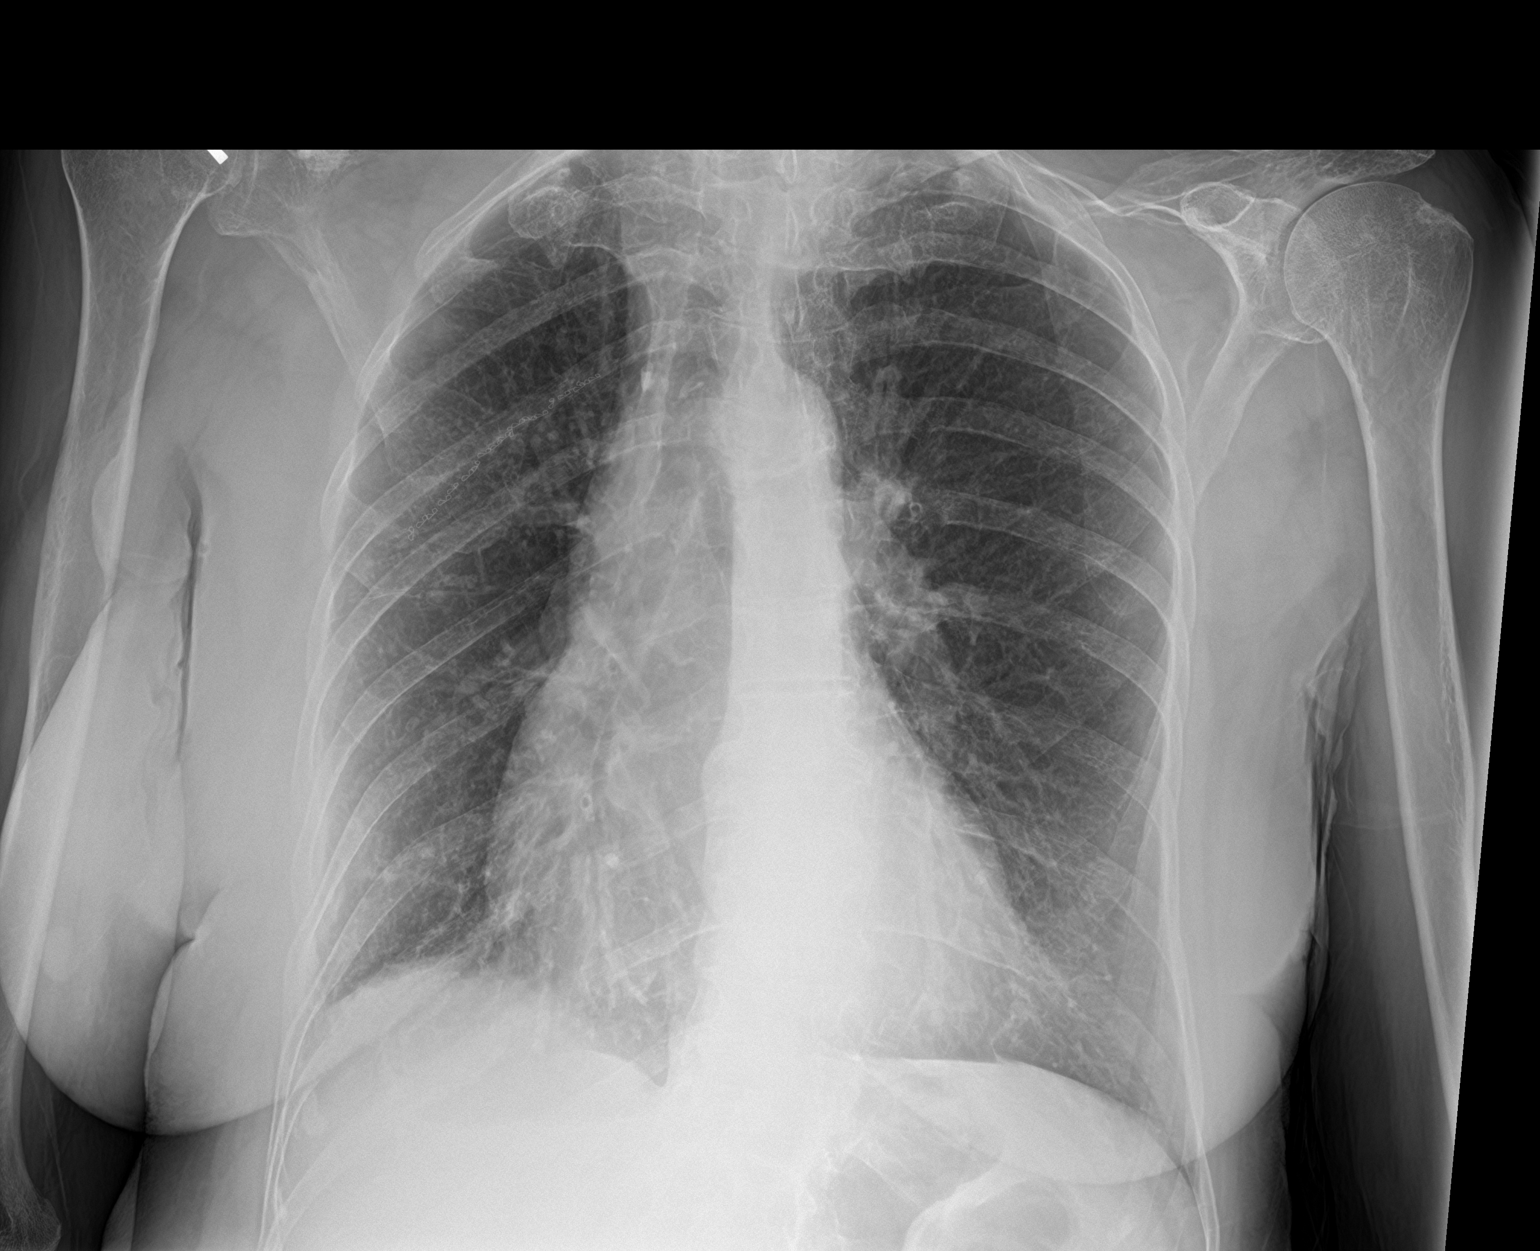

[2 of 2 positions shown; findings below may reference images not displayed]

FINDINGS: Borderline cardiomegaly is stable. Overall cardiomediastinal
silhouette is stable.

Chain sutures again noted at the level of the RIGHT upper lung. No
new opacity to suggest a developing pneumonia. No pulmonary edema.
No pleural effusion or pneumothorax seen. No acute or suspicious
osseous finding.
IMPRESSION: No active cardiopulmonary disease. No evidence of pneumonia or
pulmonary edema.

## 2020-08-23 DEATH — deceased
